# Patient Record
Sex: Female | Born: 1935 | Race: White | Hispanic: No | Smoking: Never smoker
Health system: Southern US, Community
[De-identification: ages and names within clinical notes are randomized; demographics above are authoritative.]

## PROBLEM LIST (undated history)

## (undated) DIAGNOSIS — I639 Cerebral infarction, unspecified: Secondary | ICD-10-CM

## (undated) DIAGNOSIS — G4730 Sleep apnea, unspecified: Secondary | ICD-10-CM

## (undated) DIAGNOSIS — I10 Essential (primary) hypertension: Secondary | ICD-10-CM

## (undated) DIAGNOSIS — E079 Disorder of thyroid, unspecified: Secondary | ICD-10-CM

## (undated) DIAGNOSIS — T148XXA Other injury of unspecified body region, initial encounter: Secondary | ICD-10-CM

## (undated) HISTORY — PX: OTHER SURGICAL HISTORY: SHX169

## (undated) HISTORY — PX: TOTAL SHOULDER REPLACEMENT: SUR1217

## (undated) HISTORY — PX: TOTAL ABDOMINAL HYSTERECTOMY: SHX209

## (undated) HISTORY — PX: WRIST SURGERY: SHX841

## (undated) HISTORY — DX: Other injury of unspecified body region, initial encounter: T14.8XXA

## (undated) HISTORY — DX: Essential (primary) hypertension: I10

## (undated) HISTORY — DX: Cerebral infarction, unspecified: I63.9

## (undated) HISTORY — DX: Sleep apnea, unspecified: G47.30

## (undated) HISTORY — DX: Disorder of thyroid, unspecified: E07.9

---

## 2000-04-22 ENCOUNTER — Ambulatory Visit (HOSPITAL_COMMUNITY): Admission: RE | Admit: 2000-04-22 | Discharge: 2000-04-22 | Payer: Self-pay | Admitting: Specialist

## 2000-04-22 ENCOUNTER — Encounter: Payer: Self-pay | Admitting: Specialist

## 2000-05-28 ENCOUNTER — Encounter: Payer: Self-pay | Admitting: Specialist

## 2000-05-28 ENCOUNTER — Ambulatory Visit (HOSPITAL_COMMUNITY): Admission: RE | Admit: 2000-05-28 | Discharge: 2000-05-28 | Payer: Self-pay | Admitting: Specialist

## 2000-06-11 ENCOUNTER — Encounter: Payer: Self-pay | Admitting: Specialist

## 2000-06-11 ENCOUNTER — Ambulatory Visit (HOSPITAL_COMMUNITY): Admission: RE | Admit: 2000-06-11 | Discharge: 2000-06-11 | Payer: Self-pay | Admitting: Specialist

## 2005-06-26 ENCOUNTER — Ambulatory Visit: Payer: Self-pay | Admitting: Family Medicine

## 2005-07-02 ENCOUNTER — Ambulatory Visit: Payer: Self-pay | Admitting: Internal Medicine

## 2005-11-20 ENCOUNTER — Other Ambulatory Visit: Payer: Self-pay

## 2005-11-20 ENCOUNTER — Emergency Department: Payer: Self-pay | Admitting: Emergency Medicine

## 2005-11-21 ENCOUNTER — Ambulatory Visit: Payer: Self-pay | Admitting: Emergency Medicine

## 2006-01-24 ENCOUNTER — Ambulatory Visit: Payer: Self-pay | Admitting: Ophthalmology

## 2006-03-18 ENCOUNTER — Ambulatory Visit: Payer: Self-pay | Admitting: General Surgery

## 2006-05-25 ENCOUNTER — Emergency Department: Payer: Self-pay | Admitting: Unknown Physician Specialty

## 2006-05-25 ENCOUNTER — Other Ambulatory Visit: Payer: Self-pay

## 2006-08-28 ENCOUNTER — Ambulatory Visit: Payer: Self-pay | Admitting: Internal Medicine

## 2006-10-01 ENCOUNTER — Emergency Department: Payer: Self-pay | Admitting: Emergency Medicine

## 2006-10-29 ENCOUNTER — Ambulatory Visit: Payer: Self-pay | Admitting: Internal Medicine

## 2007-03-13 ENCOUNTER — Encounter: Admission: RE | Admit: 2007-03-13 | Discharge: 2007-03-13 | Payer: Self-pay | Admitting: Orthopaedic Surgery

## 2007-03-17 ENCOUNTER — Ambulatory Visit (HOSPITAL_BASED_OUTPATIENT_CLINIC_OR_DEPARTMENT_OTHER): Admission: RE | Admit: 2007-03-17 | Discharge: 2007-03-18 | Payer: Self-pay | Admitting: Orthopaedic Surgery

## 2007-11-05 ENCOUNTER — Ambulatory Visit: Payer: Self-pay | Admitting: Internal Medicine

## 2007-11-17 ENCOUNTER — Ambulatory Visit: Payer: Self-pay | Admitting: Internal Medicine

## 2008-11-07 ENCOUNTER — Ambulatory Visit: Payer: Self-pay | Admitting: Internal Medicine

## 2009-03-08 ENCOUNTER — Ambulatory Visit: Payer: Self-pay | Admitting: Internal Medicine

## 2009-05-08 ENCOUNTER — Ambulatory Visit: Payer: Self-pay | Admitting: Internal Medicine

## 2009-05-16 ENCOUNTER — Ambulatory Visit: Payer: Self-pay | Admitting: Internal Medicine

## 2009-06-15 ENCOUNTER — Ambulatory Visit: Payer: Self-pay | Admitting: Internal Medicine

## 2009-06-22 ENCOUNTER — Ambulatory Visit: Payer: Self-pay | Admitting: Internal Medicine

## 2009-08-08 ENCOUNTER — Ambulatory Visit: Payer: Self-pay | Admitting: Internal Medicine

## 2009-08-24 ENCOUNTER — Ambulatory Visit: Payer: Self-pay | Admitting: Internal Medicine

## 2010-09-29 ENCOUNTER — Emergency Department: Payer: Self-pay | Admitting: Unknown Physician Specialty

## 2010-10-02 ENCOUNTER — Emergency Department: Payer: Self-pay | Admitting: Emergency Medicine

## 2010-10-08 ENCOUNTER — Ambulatory Visit: Payer: Self-pay | Admitting: Internal Medicine

## 2010-10-11 ENCOUNTER — Ambulatory Visit: Payer: Self-pay | Admitting: Internal Medicine

## 2010-12-03 ENCOUNTER — Encounter: Payer: Self-pay | Admitting: Internal Medicine

## 2010-12-16 ENCOUNTER — Encounter: Payer: Self-pay | Admitting: Internal Medicine

## 2011-01-16 ENCOUNTER — Encounter: Payer: Self-pay | Admitting: Internal Medicine

## 2011-01-23 ENCOUNTER — Emergency Department: Payer: Self-pay | Admitting: Emergency Medicine

## 2011-01-29 ENCOUNTER — Encounter: Payer: Self-pay | Admitting: Internal Medicine

## 2011-01-29 ENCOUNTER — Emergency Department: Payer: Self-pay | Admitting: Emergency Medicine

## 2011-02-06 ENCOUNTER — Ambulatory Visit: Payer: Self-pay | Admitting: Unknown Physician Specialty

## 2011-02-07 ENCOUNTER — Ambulatory Visit: Payer: Self-pay | Admitting: Unknown Physician Specialty

## 2011-02-10 ENCOUNTER — Emergency Department: Payer: Self-pay | Admitting: Emergency Medicine

## 2011-02-14 ENCOUNTER — Encounter: Payer: Self-pay | Admitting: Internal Medicine

## 2011-03-17 ENCOUNTER — Encounter: Payer: Self-pay | Admitting: Internal Medicine

## 2011-03-21 ENCOUNTER — Inpatient Hospital Stay: Payer: Self-pay | Admitting: Internal Medicine

## 2011-03-31 ENCOUNTER — Inpatient Hospital Stay: Payer: Self-pay | Admitting: Internal Medicine

## 2011-04-16 ENCOUNTER — Encounter: Payer: Self-pay | Admitting: Internal Medicine

## 2011-04-24 ENCOUNTER — Inpatient Hospital Stay: Payer: Self-pay | Admitting: Internal Medicine

## 2011-04-24 DIAGNOSIS — I6789 Other cerebrovascular disease: Secondary | ICD-10-CM

## 2011-05-06 ENCOUNTER — Emergency Department: Payer: Self-pay | Admitting: Emergency Medicine

## 2011-05-10 ENCOUNTER — Inpatient Hospital Stay: Payer: Self-pay | Admitting: Internal Medicine

## 2011-07-24 ENCOUNTER — Encounter: Payer: Self-pay | Admitting: Internal Medicine

## 2011-08-05 ENCOUNTER — Other Ambulatory Visit: Payer: Self-pay | Admitting: Internal Medicine

## 2011-08-05 MED ORDER — GLIPIZIDE 5 MG PO TABS: 5.0000 mg | ORAL_TABLET | Freq: Every day | ORAL | Status: DC

## 2011-08-06 ENCOUNTER — Other Ambulatory Visit: Payer: Self-pay | Admitting: *Deleted

## 2011-08-06 ENCOUNTER — Telehealth: Payer: Self-pay | Admitting: Internal Medicine

## 2011-08-06 MED ORDER — OMEPRAZOLE 20 MG PO CPDR: 20.0000 mg | DELAYED_RELEASE_CAPSULE | Freq: Two times a day (BID) | ORAL | Status: DC

## 2011-08-06 MED ORDER — LEVOTHYROXINE SODIUM 75 MCG PO TABS: 75.0000 ug | ORAL_TABLET | Freq: Every day | ORAL | Status: DC

## 2011-08-06 NOTE — Telephone Encounter (Signed)
Please call patient, she needs refills for her reflux & thyroid meds

## 2011-08-29 ENCOUNTER — Observation Stay: Payer: Self-pay | Admitting: Internal Medicine

## 2011-08-31 ENCOUNTER — Encounter: Payer: Self-pay | Admitting: Internal Medicine

## 2011-09-02 ENCOUNTER — Telehealth: Payer: Self-pay | Admitting: Internal Medicine

## 2011-09-02 NOTE — Telephone Encounter (Signed)
Called in the Macrobid to [facility] [address] and informed her to f/up next week after she takes the medication

## 2011-09-02 NOTE — Telephone Encounter (Signed)
Message copied by Virgina Evener on Mon Sep 02, 2011  5:25 PM ------      Message from: Ronna Polio A      Created: Mon Sep 02, 2011 11:59 AM      Regarding: UTI       I was called by hospitalist that pt had UTI. Pt needs to STOP levaquin and start Macrobid 100mg  by mouth twice daily x7 days. She also needs follow up here this week or next.

## 2011-09-10 ENCOUNTER — Other Ambulatory Visit: Payer: Self-pay | Admitting: Internal Medicine

## 2011-09-11 ENCOUNTER — Encounter: Payer: Self-pay | Admitting: Internal Medicine

## 2011-09-11 ENCOUNTER — Ambulatory Visit (INDEPENDENT_AMBULATORY_CARE_PROVIDER_SITE_OTHER): Payer: Medicare Other | Admitting: Internal Medicine

## 2011-09-11 DIAGNOSIS — E039 Hypothyroidism, unspecified: Secondary | ICD-10-CM

## 2011-09-11 DIAGNOSIS — F419 Anxiety disorder, unspecified: Secondary | ICD-10-CM

## 2011-09-11 DIAGNOSIS — Z8673 Personal history of transient ischemic attack (TIA), and cerebral infarction without residual deficits: Secondary | ICD-10-CM | POA: Insufficient documentation

## 2011-09-11 DIAGNOSIS — I10 Essential (primary) hypertension: Secondary | ICD-10-CM

## 2011-09-11 DIAGNOSIS — E785 Hyperlipidemia, unspecified: Secondary | ICD-10-CM

## 2011-09-11 DIAGNOSIS — E118 Type 2 diabetes mellitus with unspecified complications: Secondary | ICD-10-CM | POA: Insufficient documentation

## 2011-09-11 DIAGNOSIS — IMO0002 Reserved for concepts with insufficient information to code with codable children: Secondary | ICD-10-CM

## 2011-09-11 DIAGNOSIS — W19XXXA Unspecified fall, initial encounter: Secondary | ICD-10-CM

## 2011-09-11 DIAGNOSIS — S0993XA Unspecified injury of face, initial encounter: Secondary | ICD-10-CM

## 2011-09-11 DIAGNOSIS — Z23 Encounter for immunization: Secondary | ICD-10-CM

## 2011-09-11 DIAGNOSIS — I6350 Cerebral infarction due to unspecified occlusion or stenosis of unspecified cerebral artery: Secondary | ICD-10-CM

## 2011-09-11 DIAGNOSIS — F411 Generalized anxiety disorder: Secondary | ICD-10-CM

## 2011-09-11 DIAGNOSIS — E119 Type 2 diabetes mellitus without complications: Secondary | ICD-10-CM

## 2011-09-11 DIAGNOSIS — I639 Cerebral infarction, unspecified: Secondary | ICD-10-CM

## 2011-09-11 LAB — PROTIME-INR
INR: 3.19 — ABNORMAL HIGH (ref <1.50)
Prothrombin Time: 33.7 seconds — ABNORMAL HIGH (ref 11.6–15.2)

## 2011-09-11 NOTE — Patient Instructions (Signed)
Labs today.    Follow up in 1 month

## 2011-09-11 NOTE — Progress Notes (Signed)
Subjective:    Patient ID: Alexandra English, female    DOB: 1927/07/24, 77 y.o.   MRN: [id number]  HPI Alexandra English is an 77 year old female with a history of hypertension, hyperlipidemia, diabetes mellitus, and recent history of several strokes. She had been doing well over the last 6 months until 2 weeks ago when she tripped on a rug at home and fell hitting the right side of her face on the ground. She had extensive bruising over her face as a result of this. She was evaluated in the emergency room and had a CT of her head which was negative for acute hemorrhage. During her evaluation in the emergency room she was noted to have a urinary tract infection. She continues on antibiotics for this, and is almost complete with intercourse. She denies any fever, chills, dysuria, weakness, change in her gait. Her son has made extensive efforts in her home to prevent falls. She denies any complaints today, with the exception of mild discomfort over the right side of her face.  Outpatient Encounter Prescriptions as of 09/11/2011  Medication Sig Dispense Refill  . aspirin 81 MG tablet Take 81 mg by mouth daily.        Marland Kitchen atorvastatin (LIPITOR) 40 MG tablet TAKE ONE TABLET BY MOUTH EVERY DAY  30 tablet  11  . glipiZIDE (GLUCOTROL) 5 MG tablet Take 1 tablet (5 mg total) by mouth daily.  180 tablet  3  . levothyroxine (SYNTHROID) 75 MCG tablet Take 1 tablet (75 mcg total) by mouth daily.  30 tablet  3  . omeprazole (PRILOSEC) 20 MG capsule Take 1 capsule (20 mg total) by mouth 2 (two) times daily.  60 capsule  3  . warfarin (COUMADIN) 3 MG tablet Take 3 mg by mouth daily.          Review of Systems  Constitutional: Negative for fever, chills, appetite change, fatigue and unexpected weight change.  HENT: Negative for ear pain, congestion, sore throat, trouble swallowing, neck pain, voice change and sinus pressure.   Eyes: Negative for visual disturbance.  Respiratory: Negative for cough, shortness of breath,  wheezing and stridor.   Cardiovascular: Negative for chest pain, palpitations and leg swelling.  Gastrointestinal: Negative for nausea, vomiting, abdominal pain, diarrhea, constipation, blood in stool, abdominal distention and anal bleeding.  Genitourinary: Negative for dysuria and flank pain.  Musculoskeletal: Negative for myalgias, arthralgias and gait problem.  Skin: Negative for color change and rash.  Neurological: Negative for dizziness and headaches.  Hematological: Negative for adenopathy. Does not bruise/bleed easily.  Psychiatric/Behavioral: Negative for suicidal ideas, sleep disturbance and dysphoric mood. The patient is not nervous/anxious.    BP 179/82  Pulse 66  Temp(Src) 98 F (36.7 C) (Oral)  Resp 16  Ht 5\' 5"  (1.651 m)  Wt 184 lb (83.462 kg)  BMI 30.62 kg/m2  SpO2 99%     Objective:   Physical Exam  Constitutional: She is oriented to person, place, and time. She appears well-developed and well-nourished. No distress.  HENT:  Head: Normocephalic and atraumatic.  Right Ear: External ear normal.  Left Ear: External ear normal.  Nose: Nose normal.  Mouth/Throat: Oropharynx is clear and moist. No oropharyngeal exudate.  Eyes: Conjunctivae are normal. Pupils are equal, round, and reactive to light. Right eye exhibits no discharge. Left eye exhibits no discharge. No scleral icterus.  Neck: Normal range of motion. Neck supple. No tracheal deviation present. No thyromegaly present.  Cardiovascular: Normal rate, regular rhythm, normal heart sounds  and intact distal pulses.  Exam reveals no gallop and no friction rub.   No murmur heard. Pulmonary/Chest: Effort normal and breath sounds normal. No respiratory distress. She has no wheezes. She has no rales. She exhibits no tenderness.  Musculoskeletal: Normal range of motion. She exhibits no edema and no tenderness.  Lymphadenopathy:    She has no cervical adenopathy.  Neurological: She is alert and oriented to person, place,  and time. No cranial nerve deficit. She exhibits normal muscle tone. Coordination normal.  Skin: Skin is warm and dry. No rash noted. She is not diaphoretic. There is erythema. No pallor.     Psychiatric: She has a normal mood and affect. Her behavior is normal. Judgment and thought content normal.          Assessment & Plan:  1. Fall - patient with a recent fall resulting in facial contusion. She is doing well. Her son has made extensive efforts in her home to prevent further falls. She currently walks with a Alexandra English. Overall she is doing very well considering she had several strokes approximately 6 months ago. She understands the risk of continuing on Coumadin, and she prefers to take this risk in an effort to avoid recurrent stroke. We will check her INR with labs today.  2. DM - diabetes has been historically well controlled. Will check hemoglobin A1c with labs today.  3. HTN - blood pressure is slightly elevated today. Her blood pressure has historically been difficult to control. When checked at home by home health, her blood pressure was improved compared to in clinic. We'll continue to monitor for now. We will plan to check renal function and electrolytes and labs today.  4. HL - patient would prefer to stop statin medication. I encouraged her to continue with atorvastatin given her recent history of strokes. We will check liver function tests with labs today. She will followup in one month.

## 2011-09-12 ENCOUNTER — Other Ambulatory Visit: Payer: Self-pay | Admitting: Internal Medicine

## 2011-09-12 LAB — CBC WITH DIFFERENTIAL/PLATELET
Basophils Relative: 0.3 % (ref 0.0–3.0)
Eosinophils Absolute: 0.2 10*3/uL (ref 0.0–0.7)
HCT: 44.5 % (ref 36.0–46.0)
Lymphs Abs: 2.7 10*3/uL (ref 0.7–4.0)
MCHC: 32.5 g/dL (ref 30.0–36.0)
MCV: 93.7 fl (ref 78.0–100.0)
Monocytes Absolute: 0.4 10*3/uL (ref 0.1–1.0)
Neutrophils Relative %: 68.8 % (ref 43.0–77.0)
Platelets: 235 10*3/uL (ref 150.0–400.0)

## 2011-09-13 ENCOUNTER — Encounter: Payer: Self-pay | Admitting: Internal Medicine

## 2011-09-13 LAB — COMPREHENSIVE METABOLIC PANEL
Alkaline Phosphatase: 98 U/L (ref 39–117)
BUN: 15 mg/dL (ref 6–23)
Glucose, Bld: 78 mg/dL (ref 70–99)
Sodium: 142 mEq/L (ref 135–145)
Total Bilirubin: 0.3 mg/dL (ref 0.3–1.2)
Total Protein: 8.5 g/dL — ABNORMAL HIGH (ref 6.0–8.3)

## 2011-09-13 LAB — TSH: TSH: 1.41 u[IU]/mL (ref 0.35–5.50)

## 2011-09-13 LAB — HEMOGLOBIN A1C: Hgb A1c MFr Bld: 6.9 % — ABNORMAL HIGH (ref 4.6–6.5)

## 2011-09-27 ENCOUNTER — Telehealth: Payer: Self-pay | Admitting: Internal Medicine

## 2011-09-27 NOTE — Telephone Encounter (Signed)
Her INR is overdue. It was due earlier this week. On last check it was slightly high.  We can either do it here or have home health do it.

## 2011-09-27 NOTE — Telephone Encounter (Signed)
[name] @ [facility] CALLED SHE WANTED TO KNOW WHEN Alexandra English'S NEXT PT/INR IS DUE. PT REFUSES TO LET THEM DO IT THERE AND SHE DOES NOT WANT TO COME BACK IN THE OFFICE HER PHONE NUMBER IS [phone]

## 2011-10-01 NOTE — Telephone Encounter (Signed)
I talked with Ms. Heying and she agreed after 45 minutes on the phone to let sharon come out in the morning and draw her PT/INR.

## 2011-10-02 LAB — PROTIME-INR

## 2011-10-03 ENCOUNTER — Telehealth: Payer: Self-pay | Admitting: *Deleted

## 2011-10-03 NOTE — Telephone Encounter (Signed)
Patient is currently taking 3 mg daily.   I spoke with Dr. Dan Humphreys and per Dr. Dan Humphreys she is to take 3 on  Mon, wed, and Frid and 2 mg on all the other days. Recheck PT / INR in one week.

## 2011-10-03 NOTE — Telephone Encounter (Signed)
Message copied by Jobie Quaker on Thu Oct 03, 2011  4:06 PM ------      Message from: Ronna Polio A      Created: Thu Oct 03, 2011  7:55 AM      Regarding: INR       Alexandra English's INR from [facility] was slightly high at 3.6. I know that she has been reluctant to have INR checked. We will need to reduce coumadin dose (first need to confirm what she is taking) and then have her recheck INR in 1 week.      If she is not willing to do this, then we need to stop coumadin.

## 2011-10-07 ENCOUNTER — Encounter: Payer: Self-pay | Admitting: Internal Medicine

## 2011-10-07 ENCOUNTER — Ambulatory Visit (INDEPENDENT_AMBULATORY_CARE_PROVIDER_SITE_OTHER): Payer: Medicare Other | Admitting: Internal Medicine

## 2011-10-07 DIAGNOSIS — I10 Essential (primary) hypertension: Secondary | ICD-10-CM

## 2011-10-07 DIAGNOSIS — I6350 Cerebral infarction due to unspecified occlusion or stenosis of unspecified cerebral artery: Secondary | ICD-10-CM

## 2011-10-07 DIAGNOSIS — R300 Dysuria: Secondary | ICD-10-CM

## 2011-10-07 DIAGNOSIS — I639 Cerebral infarction, unspecified: Secondary | ICD-10-CM

## 2011-10-07 DIAGNOSIS — E119 Type 2 diabetes mellitus without complications: Secondary | ICD-10-CM

## 2011-10-07 LAB — POCT URINALYSIS DIPSTICK
Blood, UA: NEGATIVE
Glucose, UA: NEGATIVE
Ketones, UA: NEGATIVE
Spec Grav, UA: 1.005
Urobilinogen, UA: 0.2

## 2011-10-07 NOTE — Progress Notes (Signed)
Subjective:    Patient ID: Alexandra English, female    DOB: [birth year], 77 y.o.   MRN: [id number]  HPI Alexandra English is a an 77 year old female with a history of hypertension, diabetes, and recently several strokes who presents for followup. She has been on anticoagulation with coumadin at the recommendation of Dr. Chestine Spore from neurology after a series of several strokes. She had significant improvement in her ability to function after this intervention. She continues to have mild expressive aphasia and gait instability. At her last visit one month ago she had recently suffered a fall leading to a contusion of her right face. She has had no falls since her last visit. She reports that her bruising is healing well. She has no complaints today. She continues on coumadin and recent INR was 3.6.  Coumadin was held and restarted at lower dose.  She reports full compliance with this change.  Also at her last visit, she was recovering from a recent urinary tract infection. She reports she completed her antibiotics. She is interested in repeating her urinalysis today to ensure no residual infection. She denies any fever, flank pain, hematuria. She does have occasional urinary frequency.  Outpatient Encounter Prescriptions as of 10/07/2011  Medication Sig Dispense Refill  . amLODipine (NORVASC) 5 MG tablet Take 5 mg by mouth daily.        Marland Kitchen aspirin 81 MG tablet Take 81 mg by mouth daily.        Marland Kitchen BAYER CONTOUR TEST test strip       . cloNIDine (CATAPRES) 0.2 MG tablet Take 0.2 mg by mouth daily.        Marland Kitchen glipiZIDE (GLUCOTROL) 5 MG tablet Take 1 tablet (5 mg total) by mouth daily.  180 tablet  3  . hydrOXYzine (ATARAX/VISTARIL) 25 MG tablet Take 25 mg by mouth daily as needed.        . latanoprost (XALATAN) 0.005 % ophthalmic solution       . levothyroxine (SYNTHROID) 75 MCG tablet Take 1 tablet (75 mcg total) by mouth daily.  30 tablet  3  . omeprazole (PRILOSEC) 20 MG capsule Take 1 capsule (20 mg total) by mouth  2 (two) times daily.  60 capsule  3  . warfarin (COUMADIN) 3 MG tablet Take 3 mg by mouth daily. Take 3mg  Mon,Wed, and Friday and take 2mg  Tues,thurs, Sat. And Sunday      . atorvastatin (LIPITOR) 40 MG tablet TAKE ONE TABLET BY MOUTH EVERY DAY  30 tablet  11    Review of Systems  Constitutional: Negative for fever, chills, appetite change, fatigue and unexpected weight change.  HENT: Negative for ear pain, congestion, sore throat, trouble swallowing, neck pain, voice change and sinus pressure.   Eyes: Negative for visual disturbance.  Respiratory: Negative for cough, shortness of breath, wheezing and stridor.   Cardiovascular: Negative for chest pain, palpitations and leg swelling.  Gastrointestinal: Negative for nausea, vomiting, abdominal pain, diarrhea, constipation, blood in stool, abdominal distention and anal bleeding.  Genitourinary: Positive for frequency. Negative for dysuria, urgency, hematuria and flank pain.  Musculoskeletal: Positive for myalgias and gait problem. Negative for arthralgias.  Skin: Positive for color change. Negative for rash.  Neurological: Negative for dizziness and headaches.  Hematological: Negative for adenopathy. Does not bruise/bleed easily.  Psychiatric/Behavioral: Negative for suicidal ideas, sleep disturbance and dysphoric mood. The patient is not nervous/anxious.    BP 146/86  Pulse 91  Temp(Src) 97.5 F (36.4 C) (Oral)  Resp 16  Ht 5\' 5"  (1.651 m)  Wt 187 lb 12 oz (85.163 kg)  BMI 31.24 kg/m2  SpO2 100%     Objective:   Physical Exam  Constitutional: She is oriented to person, place, and time. She appears well-developed and well-nourished. No distress.  HENT:  Head: Normocephalic and atraumatic.  Right Ear: External ear normal.  Left Ear: External ear normal.  Nose: Nose normal.  Mouth/Throat: Oropharynx is clear and moist. No oropharyngeal exudate.  Eyes: Conjunctivae are normal. Pupils are equal, round, and reactive to light. Right eye  exhibits no discharge. Left eye exhibits no discharge. No scleral icterus.  Neck: Normal range of motion. Neck supple. No tracheal deviation present. No thyromegaly present.  Cardiovascular: Normal rate, regular rhythm, normal heart sounds and intact distal pulses.  Exam reveals no gallop and no friction rub.   No murmur heard. Pulmonary/Chest: Effort normal and breath sounds normal. No respiratory distress. She has no wheezes. She has no rales. She exhibits no tenderness.  Musculoskeletal: Normal range of motion. She exhibits no edema and no tenderness.  Lymphadenopathy:    She has no cervical adenopathy.  Neurological: She is alert and oriented to person, place, and time. No cranial nerve deficit. She exhibits normal muscle tone. Coordination normal.  Skin: Skin is warm and dry. No rash noted. She is not diaphoretic. No erythema. No pallor.     Psychiatric: She has a normal mood and affect. Her behavior is normal. Judgment and thought content normal.          Assessment & Plan:  1. Anticoagulation management - recent INR was supratherapeutic. Patient's Coumadin was held for 2 days and then restart at a lower dose. She will repeat INR next Friday. We again reviewed risk of bleeding while on Coumadin. She has taken measures in her home to prevent further falls. She will followup in 3 months.  2. Hypertension - blood pressure is slightly elevated, but improved today. We'll plan to continue current medications. She is also being monitored by home health with regular blood pressure checks. She will followup in 3 months.  3. Urinary tract infection -urinalysis is normal today. Will send urine for culture to confirm that treatment cured infection.

## 2011-10-09 LAB — URINE CULTURE: Colony Count: NO GROWTH

## 2011-10-15 ENCOUNTER — Telehealth: Payer: Self-pay | Admitting: Internal Medicine

## 2011-10-15 NOTE — Telephone Encounter (Signed)
She needs INR this week.

## 2011-10-15 NOTE — Telephone Encounter (Signed)
Call Alexandra English with the next time patient is to have PT/INR.

## 2011-10-17 LAB — PROTIME-INR

## 2011-10-18 ENCOUNTER — Telehealth: Payer: Self-pay | Admitting: Internal Medicine

## 2011-10-18 NOTE — Telephone Encounter (Signed)
Informed Jasmine December and Ms. Blasko that her INR was 1.8 just below goal 2-3 and not to change her regimen and we will repeat in one month.  Jasmine December from [facility] is discharging her next week because she has no physcial need.  Please advise.

## 2011-10-18 NOTE — Telephone Encounter (Signed)
Done

## 2011-10-18 NOTE — Telephone Encounter (Signed)
INR was 1.8.  This is just below goal of 2-3, however I would prefer not to make changes to regimen. Will repeat INR 1 month.

## 2011-10-18 NOTE — Telephone Encounter (Signed)
OK. Should come here for INR in 48month.

## 2011-10-21 NOTE — Telephone Encounter (Signed)
Left message at home number listed in chart for someone to give me a call about setting up the pt/inr

## 2011-10-24 ENCOUNTER — Encounter: Payer: Self-pay | Admitting: Internal Medicine

## 2011-10-30 NOTE — Telephone Encounter (Signed)
Spoke with patient and she says that she can't come to out office for her pt/inr because she does not have a way to get here. She has been discharged from home health and is asking if there is another way for her to have her pt/inr done from home. I have left message for son to call me back x 2, but he has not returned my call.

## 2011-10-30 NOTE — Telephone Encounter (Signed)
If pt cannot have PT/INR monitored on a regular basis, we will need to stop coumadin.

## 2011-10-31 NOTE — Telephone Encounter (Signed)
Left message asking son to call me.

## 2011-11-05 NOTE — Telephone Encounter (Signed)
Left message asking that someone return my call.

## 2011-11-06 ENCOUNTER — Encounter: Payer: Self-pay | Admitting: Internal Medicine

## 2011-11-08 ENCOUNTER — Telehealth: Payer: Self-pay | Admitting: Internal Medicine

## 2011-11-08 NOTE — Telephone Encounter (Signed)
Son called and had a question about his mother Alexandra English and testing, he states she got tested less than a month ago.  He states someone from our office called and stated she didn't need to take this medication anymore.  He said it wasn't time for her to have another Pt/inr testing until 2 weeks or so.  Please advise.

## 2011-11-08 NOTE — Telephone Encounter (Signed)
I spoke w/son, he has a lot of concerns regarding change in patient's behavior. Per son, she has always been a negative person and "hard to get along with" but it has been worse over the last month or so. I scheduled an apt for patient to see Dr Dan Humphreys on 12/6. Son would like to speak with you privately for a couple minutes before she is seen for OV. I advised him that we could try to work this out if time permits and I blocked 30 minutes for this f/u apt. Son also requested that we call him first for anything. I changed the home # to his number.

## 2011-11-08 NOTE — Telephone Encounter (Signed)
Patient and family notified. She will stop the coumadin.

## 2011-11-13 ENCOUNTER — Encounter: Payer: Self-pay | Admitting: Internal Medicine

## 2011-11-19 ENCOUNTER — Other Ambulatory Visit: Payer: Self-pay | Admitting: Internal Medicine

## 2011-11-21 ENCOUNTER — Other Ambulatory Visit: Payer: Medicare Other

## 2011-11-21 ENCOUNTER — Encounter: Payer: Self-pay | Admitting: Internal Medicine

## 2011-11-21 ENCOUNTER — Ambulatory Visit (INDEPENDENT_AMBULATORY_CARE_PROVIDER_SITE_OTHER): Payer: Medicare Other | Admitting: Internal Medicine

## 2011-11-21 VITALS — BP 128/76 | HR 76 | Temp 98.3°F | Resp 14 | Wt 191.8 lb

## 2011-11-21 DIAGNOSIS — F32A Depression, unspecified: Secondary | ICD-10-CM

## 2011-11-21 DIAGNOSIS — D649 Anemia, unspecified: Secondary | ICD-10-CM

## 2011-11-21 DIAGNOSIS — M25579 Pain in unspecified ankle and joints of unspecified foot: Secondary | ICD-10-CM

## 2011-11-21 DIAGNOSIS — E119 Type 2 diabetes mellitus without complications: Secondary | ICD-10-CM

## 2011-11-21 DIAGNOSIS — Z7901 Long term (current) use of anticoagulants: Secondary | ICD-10-CM

## 2011-11-21 DIAGNOSIS — F329 Major depressive disorder, single episode, unspecified: Secondary | ICD-10-CM | POA: Insufficient documentation

## 2011-11-21 DIAGNOSIS — M25572 Pain in left ankle and joints of left foot: Secondary | ICD-10-CM

## 2011-11-21 DIAGNOSIS — F3289 Other specified depressive episodes: Secondary | ICD-10-CM

## 2011-11-21 LAB — PROTIME-INR
INR: 1.32 (ref <1.50)
Prothrombin Time: 16.9 seconds — ABNORMAL HIGH (ref 11.6–15.2)

## 2011-11-21 MED ORDER — GLIPIZIDE 5 MG PO TABS: 5.0000 mg | ORAL_TABLET | Freq: Every day | ORAL | Status: DC
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
## 2011-11-21 NOTE — Progress Notes (Signed)
Addended by: Jobie Quaker on: 11/21/2011 05:16 PM   Modules accepted: Orders

## 2011-11-21 NOTE — Progress Notes (Signed)
Addended by: Jobie Quaker on: 11/21/2011 05:12 PM   Modules accepted: Orders

## 2011-11-22 ENCOUNTER — Telehealth: Payer: Self-pay | Admitting: *Deleted

## 2011-11-22 LAB — COMPREHENSIVE METABOLIC PANEL
AST: 25 U/L (ref 0–37)
BUN: 17 mg/dL (ref 6–23)
Calcium: 9.1 mg/dL (ref 8.4–10.5)
Chloride: 106 mEq/L (ref 96–112)
Creatinine, Ser: 0.7 mg/dL (ref 0.4–1.2)
Total Bilirubin: 0.8 mg/dL (ref 0.3–1.2)

## 2011-11-22 LAB — CBC WITH DIFFERENTIAL/PLATELET
Basophils Relative: 0.5 % (ref 0.0–3.0)
Eosinophils Absolute: 0.2 10*3/uL (ref 0.0–0.7)
HCT: 38.7 % (ref 36.0–46.0)
Hemoglobin: 13 g/dL (ref 12.0–15.0)
Lymphocytes Relative: 24.4 % (ref 12.0–46.0)
Lymphs Abs: 2.1 10*3/uL (ref 0.7–4.0)
MCHC: 33.7 g/dL (ref 30.0–36.0)
MCV: 93.3 fl (ref 78.0–100.0)
Monocytes Absolute: 0.5 10*3/uL (ref 0.1–1.0)
Neutro Abs: 5.7 10*3/uL (ref 1.4–7.7)
RBC: 4.15 Mil/uL (ref 3.87–5.11)
RDW: 13.9 % (ref 11.5–14.6)

## 2011-11-22 NOTE — Telephone Encounter (Signed)
Message copied by Vernie Murders on Fri Nov 22, 2011  4:01 PM ------      Message from: Ronna Polio A      Created: Thu Nov 21, 2011  9:38 PM       INR was very low at 1.32. Has pt been taking her coumadin? This would be a big drop in INR.  If yes, then please confirm dose she has been taking.

## 2011-11-27 ENCOUNTER — Other Ambulatory Visit: Payer: Medicare Other

## 2011-11-27 ENCOUNTER — Other Ambulatory Visit: Payer: Self-pay | Admitting: Internal Medicine

## 2011-11-27 ENCOUNTER — Telehealth: Payer: Self-pay | Admitting: *Deleted

## 2011-11-27 DIAGNOSIS — Z7901 Long term (current) use of anticoagulants: Secondary | ICD-10-CM

## 2011-11-27 NOTE — Telephone Encounter (Signed)
Can you call her son and let him know about this? Home health is not going to come out once per month for labs for another need.  I would recommend stopping Coumadin because risk is too great without monitoring and the neurologist was planning to stop after 6 months.

## 2011-11-27 NOTE — Telephone Encounter (Signed)
Patient came in today for pt/inr. It was unsuccessful stick x3, and the entire time the patient kept saying that she hates having her blood drawn because no one can ever get it, she hates leaving her house to have this done because she is so tired when she is done. She wants some one to come out to her house to have this done or she just wants to stop the coumadin all together. She is want home health to be set up again. According to phone note from 10-18-11 we have been through this with her before.

## 2011-11-27 NOTE — Telephone Encounter (Signed)
Son notified. He agreed with Dr. Tilman Neat plan. Also called patient at son's request. She said that she will not take the coumadin any longer. She says that the only way that she would be able to keep taking it is if some one could come out to her house.

## 2011-12-01 ENCOUNTER — Other Ambulatory Visit: Payer: Self-pay | Admitting: Internal Medicine

## 2011-12-02 ENCOUNTER — Encounter: Payer: Self-pay | Admitting: Internal Medicine

## 2011-12-02 ENCOUNTER — Ambulatory Visit (INDEPENDENT_AMBULATORY_CARE_PROVIDER_SITE_OTHER): Payer: Medicare Other | Admitting: Internal Medicine

## 2011-12-02 VITALS — BP 138/60 | HR 83 | Temp 98.2°F | Wt 191.0 lb

## 2011-12-02 DIAGNOSIS — R269 Unspecified abnormalities of gait and mobility: Secondary | ICD-10-CM

## 2011-12-02 DIAGNOSIS — J069 Acute upper respiratory infection, unspecified: Secondary | ICD-10-CM

## 2011-12-02 DIAGNOSIS — R2681 Unsteadiness on feet: Secondary | ICD-10-CM

## 2011-12-02 MED ORDER — GUAIFENESIN-CODEINE 100-10 MG/5ML PO SYRP: 5.0000 mL | ORAL_SOLUTION | Freq: Two times a day (BID) | ORAL | Status: DC | PRN

## 2011-12-02 NOTE — Progress Notes (Signed)
Subjective:    Patient ID: Alexandra English, female    DOB: [birth year], 77 y.o.   MRN: [id number]  HPI 77YO female with h/o CVA presents for acute visit after fall at home last Thursday night. Notes her left foot was caught on something and she fell to ground which was carpeted. Did not hit head. No LOC. She was unable to get back up, and used her alert device to summon EMS.  EMS evaluated her and she opted not to go to the ED. She notes some pain and stiffness in her right back and arms/legs over last few days. Has been using Tylenol with minimal improvement.  Also notes several day h/o clear nasal drainage and non-productive cough. Denies fever, chills, dyspnea, chest pain. Denies sore throat, nausea, vomiting, abdominal pain. No known sick contacts. Has some muscle pain at site of fall, but no generalized myalgia. Has been using tylenol with minimal improvement.   Review of Systems  Constitutional: Negative for fever, chills, appetite change, fatigue and unexpected weight change.  HENT: Positive for congestion, rhinorrhea and postnasal drip. Negative for ear pain, sore throat, trouble swallowing, neck pain, voice change and sinus pressure.   Eyes: Negative for visual disturbance.  Respiratory: Positive for cough. Negative for shortness of breath, wheezing and stridor.   Cardiovascular: Negative for chest pain, palpitations and leg swelling.  Gastrointestinal: Negative for nausea, vomiting, abdominal pain and diarrhea.  Genitourinary: Negative for dysuria and flank pain.  Musculoskeletal: Positive for myalgias, arthralgias and gait problem.  Skin: Negative for color change and rash.  Neurological: Negative for dizziness and headaches.  Hematological: Negative for adenopathy. Does not bruise/bleed easily.  Psychiatric/Behavioral: Negative for suicidal ideas, sleep disturbance and dysphoric mood. The patient is not nervous/anxious.        Objective:   Physical Exam  Constitutional: She is  oriented to person, place, and time. She appears well-developed and well-nourished. No distress.  HENT:  Head: Normocephalic and atraumatic.  Right Ear: External ear normal.  Left Ear: External ear normal.  Nose: Nose normal.  Mouth/Throat: Oropharynx is clear and moist. No oropharyngeal exudate.  Eyes: Conjunctivae are normal. Pupils are equal, round, and reactive to light. Right eye exhibits no discharge. Left eye exhibits no discharge. No scleral icterus.  Neck: Normal range of motion. Neck supple. No tracheal deviation present. No thyromegaly present.  Cardiovascular: Normal rate, regular rhythm, normal heart sounds and intact distal pulses.  Exam reveals no gallop and no friction rub.   No murmur heard. Pulmonary/Chest: Effort normal and breath sounds normal. No respiratory distress. She has no wheezes. She has no rales. She exhibits no tenderness.  Musculoskeletal: Normal range of motion. She exhibits no edema and no tenderness.  Lymphadenopathy:    She has no cervical adenopathy.  Neurological: She is alert and oriented to person, place, and time. No cranial nerve deficit. She exhibits normal muscle tone. Coordination normal.  Skin: Skin is warm and dry. Ecchymosis noted. No rash noted. She is not diaphoretic. No erythema. No pallor.     Psychiatric: She has a normal mood and affect. Her behavior is normal. Judgment and thought content normal.          Assessment & Plan:  1. Viral URI - Symptoms most consistent with viral URI. Rapid flu test negative today. Will continue symptom management with tylenol prn and codeine prn for cough. If symptoms are persistent or worsening over next 48hr, then pt will call.    2. Gait instability with  fall - Some superficial bruising, but no evidence of other injury. Encouraged her to continue to use her alert device when at home alone and to use Stanton Kissoon.  She is now off coumadin. She recently completed physical therapy, and does not want to  continue with this. She will follow up 12/25/2010.

## 2011-12-03 ENCOUNTER — Telehealth: Payer: Self-pay | Admitting: *Deleted

## 2011-12-03 DIAGNOSIS — R52 Pain, unspecified: Secondary | ICD-10-CM

## 2011-12-03 MED ORDER — TRAMADOL HCL 50 MG PO TABS: 50.0000 mg | ORAL_TABLET | Freq: Three times a day (TID) | ORAL | Status: DC | PRN

## 2011-12-03 NOTE — Telephone Encounter (Signed)
Pt called to get rx for something for pain .  Pt fell regular tylenol is not helping.  She would like to get tylenol 3 or something stronger [facility] [address]

## 2011-12-03 NOTE — Telephone Encounter (Signed)
Patient informed. 

## 2011-12-03 NOTE — Telephone Encounter (Signed)
Heather called - Patient requesting RX for pain med, tylenol has given no relief.

## 2011-12-03 NOTE — Telephone Encounter (Signed)
No answer, no v/m

## 2011-12-03 NOTE — Telephone Encounter (Signed)
I called in Tramadol. I don't feel comfortable using a narcotic for her, given her h/o falls and gait problems. If pain is persistent on Tramadol, will need to be seen.

## 2011-12-04 ENCOUNTER — Observation Stay: Payer: Self-pay | Admitting: Internal Medicine

## 2011-12-13 ENCOUNTER — Ambulatory Visit: Payer: Medicare Other | Admitting: Internal Medicine

## 2011-12-20 ENCOUNTER — Telehealth: Payer: Self-pay | Admitting: Internal Medicine

## 2011-12-20 DIAGNOSIS — R52 Pain, unspecified: Secondary | ICD-10-CM

## 2011-12-20 NOTE — Telephone Encounter (Signed)
Tramadol is no longer on patient's med list, I spoke with patient and she is aware that DR. Dan Humphreys is out of the office today and will be returning on monday. She is okay with waiting until then for her refill to get approval from Dr. Dan Humphreys.

## 2011-12-20 NOTE — Telephone Encounter (Signed)
[phone]  Pt called to get refill on pain meds.  Tramadol    Pt is completely out  Took last pill yesterday [facility] [address]

## 2011-12-21 NOTE — Telephone Encounter (Signed)
Fine to refill tramadol 

## 2011-12-23 MED ORDER — TRAMADOL HCL 50 MG PO TABS: 50.0000 mg | ORAL_TABLET | Freq: Three times a day (TID) | ORAL | Status: AC | PRN

## 2011-12-23 NOTE — Telephone Encounter (Signed)
Done,  Patient informed

## 2011-12-26 ENCOUNTER — Ambulatory Visit: Payer: Medicare Other | Admitting: Internal Medicine

## 2011-12-27 ENCOUNTER — Telehealth: Payer: Self-pay | Admitting: *Deleted

## 2011-12-27 NOTE — Telephone Encounter (Signed)
PT from [facility] called - FYI for MD, Pt declined PT apt this week due to foot pain. She is scheduled for OV today w/Dr Regal for eval and therapist will check in w/pt next week.

## 2012-01-08 DIAGNOSIS — R262 Difficulty in walking, not elsewhere classified: Secondary | ICD-10-CM

## 2012-01-08 DIAGNOSIS — M545 Low back pain: Secondary | ICD-10-CM

## 2012-01-08 DIAGNOSIS — IMO0001 Reserved for inherently not codable concepts without codable children: Secondary | ICD-10-CM

## 2012-01-09 ENCOUNTER — Encounter: Payer: Self-pay | Admitting: Internal Medicine

## 2012-01-09 ENCOUNTER — Ambulatory Visit (INDEPENDENT_AMBULATORY_CARE_PROVIDER_SITE_OTHER): Payer: Medicare Other | Admitting: Internal Medicine

## 2012-01-09 VITALS — BP 132/60 | HR 67 | Temp 97.7°F | Ht 65.0 in | Wt 187.0 lb

## 2012-01-09 DIAGNOSIS — I639 Cerebral infarction, unspecified: Secondary | ICD-10-CM

## 2012-01-09 DIAGNOSIS — R2681 Unsteadiness on feet: Secondary | ICD-10-CM

## 2012-01-09 DIAGNOSIS — I6350 Cerebral infarction due to unspecified occlusion or stenosis of unspecified cerebral artery: Secondary | ICD-10-CM

## 2012-01-09 DIAGNOSIS — R269 Unspecified abnormalities of gait and mobility: Secondary | ICD-10-CM

## 2012-01-09 DIAGNOSIS — M2550 Pain in unspecified joint: Secondary | ICD-10-CM

## 2012-01-09 MED ORDER — TRAMADOL HCL 50 MG PO TABS: 50.0000 mg | ORAL_TABLET | Freq: Three times a day (TID) | ORAL | Status: AC | PRN

## 2012-01-09 NOTE — Progress Notes (Signed)
Subjective:    Patient ID: Alexandra English, female    DOB: [birth year], 78 y.o.   MRN: [id number]  HPI 78 year old female with history of repeated strokes and subsequent gait instability and weakness leading to multiple falls presents for followup. In the interim since her last visit, she decided to stop her Coumadin. She opted to stop this medication because she did not want to continue with laboratory monitoring. She reports another fall since her last visit. She fell backward onto a carpeted floor. She noted some pain in her lower back for which she use tramadol with minimal improvement. She ultimately went to the emergency room where she received hydrocodone with some improvement in her pain. Plain films of the lower back were normal. She continues to be careful about preventing falls in her home. She has a caregiver to assist her during the daytime. She uses her [facility]. We have discussed continuing with physical therapy, however she does not wish to continue at this time.  Outpatient Encounter Prescriptions as of 01/09/2012  Medication Sig Dispense Refill  . amLODipine (NORVASC) 5 MG tablet TAKE ONE TABLET BY MOUTH EVERY DAY  30 tablet  11  . aspirin 81 MG tablet Take 81 mg by mouth daily.        Marland Kitchen atorvastatin (LIPITOR) 40 MG tablet TAKE ONE TABLET BY MOUTH EVERY DAY  30 tablet  11  . BAYER CONTOUR TEST test strip       . cloNIDine (CATAPRES) 0.2 MG tablet TAKE ONE TABLET BY MOUTH AT BEDTIME  30 tablet  6  . glipiZIDE (GLUCOTROL) 5 MG tablet Take 1 tablet (5 mg total) by mouth daily.  90 tablet  3  . hydrOXYzine (ATARAX/VISTARIL) 25 MG tablet Take 25 mg by mouth daily as needed.        . latanoprost (XALATAN) 0.005 % ophthalmic solution       . levothyroxine (SYNTHROID, LEVOTHROID) 75 MCG tablet TAKE ONE TABLET BY MOUTH EVERY DAY  30 tablet  11  . lidocaine (LIDODERM) 5 % Place 1 patch onto the skin daily. Remove & Discard patch within 12 hours or as directed by MD      . omeprazole (PRILOSEC)  20 MG capsule Take 1 capsule (20 mg total) by mouth 2 (two) times daily.  60 capsule  3  . guaiFENesin-codeine (ROBITUSSIN AC) 100-10 MG/5ML syrup Take 5 mLs by mouth 2 (two) times daily as needed for cough.  240 mL  0  . traMADol (ULTRAM) 50 MG tablet Take 1 tablet (50 mg total) by mouth every 8 (eight) hours as needed for pain.  90 tablet  3  . DISCONTD: warfarin (COUMADIN) 3 MG tablet Take 3 mg by mouth daily. 3 mg daily (12.7.12)        Review of Systems  Constitutional: Negative for fever, chills, appetite change, fatigue and unexpected weight change.  HENT: Negative for ear pain, congestion, sore throat, trouble swallowing, neck pain, voice change and sinus pressure.   Eyes: Negative for visual disturbance.  Respiratory: Negative for cough, shortness of breath, wheezing and stridor.   Cardiovascular: Negative for chest pain, palpitations and leg swelling.  Gastrointestinal: Negative for nausea, vomiting, abdominal pain, diarrhea, constipation, blood in stool, abdominal distention and anal bleeding.  Genitourinary: Negative for dysuria and flank pain.  Musculoskeletal: Positive for arthralgias and gait problem. Negative for myalgias.  Skin: Negative for color change and rash.  Neurological: Positive for weakness. Negative for dizziness and headaches.  Hematological: Negative for adenopathy.  Does not bruise/bleed easily.  Psychiatric/Behavioral: Negative for suicidal ideas, sleep disturbance and dysphoric mood. The patient is not nervous/anxious.    BP 132/60  Pulse 67  Temp(Src) 97.7 F (36.5 C) (Oral)  Ht 5\' 5"  (1.651 m)  Wt 187 lb (84.823 kg)  BMI 31.12 kg/m2  SpO2 98%     Objective:   Physical Exam  Constitutional: She is oriented to person, place, and time. She appears well-developed and well-nourished. No distress.  HENT:  Head: Normocephalic and atraumatic.  Right Ear: External ear normal.  Left Ear: External ear normal.  Nose: Nose normal.  Mouth/Throat: Oropharynx  is clear and moist. No oropharyngeal exudate.  Eyes: Conjunctivae are normal. Pupils are equal, round, and reactive to light. Right eye exhibits no discharge. Left eye exhibits no discharge. No scleral icterus.  Neck: Normal range of motion. Neck supple. No tracheal deviation present. No thyromegaly present.  Cardiovascular: Normal rate, regular rhythm, normal heart sounds and intact distal pulses.  Exam reveals no gallop and no friction rub.   No murmur heard. Pulmonary/Chest: Effort normal and breath sounds normal. No respiratory distress. She has no wheezes. She has no rales. She exhibits no tenderness.  Musculoskeletal: Normal range of motion. She exhibits no edema and no tenderness.  Lymphadenopathy:    She has no cervical adenopathy.  Neurological: She is alert and oriented to person, place, and time. No cranial nerve deficit. She exhibits normal muscle tone. Gait abnormal. Coordination normal.  Skin: Skin is warm and dry. No rash noted. She is not diaphoretic. No erythema. No pallor.  Psychiatric: She has a normal mood and affect. Her behavior is normal. Judgment and thought content normal.          Assessment & Plan:

## 2012-01-09 NOTE — Assessment & Plan Note (Signed)
Patient with diffuse arthralgias secondary to osteoarthritis. She reports significant improvement with tramadol. We'll plan to continue this medication. Followup in one month.

## 2012-01-09 NOTE — Assessment & Plan Note (Signed)
Patient has history of cereal strokes. She was on Coumadin with improvement in her symptoms, however she refused repeated INR monitoring. Coumadin has been discontinued. Will continue aspirin and statin medication. We'll continue diligent control of her blood pressure. Followup in one month.

## 2012-01-09 NOTE — Assessment & Plan Note (Signed)
Patient with gait instability and history of several falls. Her balance and strength were significantly worsened after serial strokes. She participated briefly in physical therapy and I think she would benefit from continuing this, however she refuses at this time. She will continue to use her Alexandra English to help stabilize her balance. She has an emergency alert device should she fall in her home. She also has caregivers from her family which assist her during the day. She will followup in one month.

## 2012-01-29 ENCOUNTER — Telehealth: Payer: Self-pay | Admitting: Internal Medicine

## 2012-01-29 NOTE — Telephone Encounter (Signed)
[phone] Pt called she wants to talk to a nurse about  Blisters on  Left her arm and palm of hand and fingers the arm and hand and fingers ache Offered pt appointment for tomorrow and she wanted to talk to nurse first

## 2012-01-30 NOTE — Telephone Encounter (Signed)
Patient has an appointment on 01/31/12 at 2:00

## 2012-01-30 NOTE — Telephone Encounter (Signed)
No answer, Left pt VM that she should be seen in the office.

## 2012-01-31 ENCOUNTER — Ambulatory Visit (INDEPENDENT_AMBULATORY_CARE_PROVIDER_SITE_OTHER): Payer: Medicare Other | Admitting: Internal Medicine

## 2012-01-31 ENCOUNTER — Encounter: Payer: Self-pay | Admitting: Internal Medicine

## 2012-01-31 ENCOUNTER — Other Ambulatory Visit: Payer: Self-pay | Admitting: Internal Medicine

## 2012-01-31 DIAGNOSIS — L0390 Cellulitis, unspecified: Secondary | ICD-10-CM | POA: Insufficient documentation

## 2012-01-31 DIAGNOSIS — B029 Zoster without complications: Secondary | ICD-10-CM | POA: Insufficient documentation

## 2012-01-31 DIAGNOSIS — L0291 Cutaneous abscess, unspecified: Secondary | ICD-10-CM

## 2012-01-31 MED ORDER — GABAPENTIN 100 MG PO CAPS: 100.0000 mg | ORAL_CAPSULE | Freq: Every day | ORAL | Status: DC

## 2012-01-31 MED ORDER — GENTAMICIN SULFATE 0.1 % EX OINT: TOPICAL_OINTMENT | Freq: Three times a day (TID) | CUTANEOUS | Status: AC

## 2012-01-31 NOTE — Assessment & Plan Note (Signed)
Symptoms are most consistent with zoster. Given that symptoms have been present almost 5 days, likely too late for use of antiviral medication. Will start Neurontin 100 mg at bedtime to help with nerve pain. She will call with update as to symptoms over the next few days. Will titrate medication as needed. Culture was sent today from lesion. Followup when necessary.

## 2012-01-31 NOTE — Progress Notes (Signed)
Subjective:    Patient ID: Alexandra English, female    DOB: 06-08-1927, 78 y.o.   MRN: [id number]  HPI 78 year old female presents with a rash down her left arm. She first noticed this approximately 4 days ago. She describes the rash as red bumps starting from her left shoulder and extending down into her left hand. The area was painful prior to the appearance of the bones. The bumps developing to small blisters which leak fluid and then crust over. They continued to be painful. She has not had any fever or chills. She has not had any weakness in her arm. She notes mild redness around the bumps.  Outpatient Encounter Prescriptions as of 01/31/2012  Medication Sig Dispense Refill  . amLODipine (NORVASC) 5 MG tablet TAKE ONE TABLET BY MOUTH EVERY DAY  30 tablet  11  . aspirin 81 MG tablet Take 81 mg by mouth daily.        Marland Kitchen atorvastatin (LIPITOR) 40 MG tablet TAKE ONE TABLET BY MOUTH EVERY DAY  30 tablet  11  . BAYER CONTOUR TEST test strip       . cloNIDine (CATAPRES) 0.2 MG tablet TAKE ONE TABLET BY MOUTH AT BEDTIME  30 tablet  6  . glipiZIDE (GLUCOTROL) 5 MG tablet Take 1 tablet (5 mg total) by mouth daily.  90 tablet  3  . hydrOXYzine (ATARAX/VISTARIL) 25 MG tablet Take 25 mg by mouth daily as needed.        . latanoprost (XALATAN) 0.005 % ophthalmic solution       . levothyroxine (SYNTHROID, LEVOTHROID) 75 MCG tablet TAKE ONE TABLET BY MOUTH EVERY DAY  30 tablet  11  . lidocaine (LIDODERM) 5 % Place 1 patch onto the skin daily. Remove & Discard patch within 12 hours or as directed by MD      . omeprazole (PRILOSEC) 20 MG capsule Take 1 capsule (20 mg total) by mouth 2 (two) times daily.  60 capsule  3  . gabapentin (NEURONTIN) 100 MG capsule Take 1 capsule (100 mg total) by mouth at bedtime.  90 capsule  3  . gentamicin ointment (GARAMYCIN) 0.1 % Apply topically 3 (three) times daily.  15 g  0  . guaiFENesin-codeine (ROBITUSSIN AC) 100-10 MG/5ML syrup Take 5 mLs by mouth 2 (two) times daily as  needed for cough.  240 mL  0    Review of Systems  Constitutional: Negative for fever, chills and fatigue.  Skin: Positive for color change and rash.  Neurological: Negative for weakness and numbness.   BP 132/66  Pulse 97  Temp(Src) 98.9 F (37.2 C) (Oral)  Wt 191 lb (86.637 kg)  SpO2 98%     Objective:   Physical Exam  Constitutional: She is oriented to person, place, and time. She appears well-developed and well-nourished. No distress.  HENT:  Head: Normocephalic and atraumatic.  Right Ear: External ear normal.  Left Ear: External ear normal.  Nose: Nose normal.  Mouth/Throat: Oropharynx is clear and moist.  Eyes: Conjunctivae are normal. Pupils are equal, round, and reactive to light. Right eye exhibits discharge.  Neck: Normal range of motion. Neck supple.  Pulmonary/Chest: Effort normal.  Musculoskeletal: Normal range of motion. She exhibits no edema and no tenderness.  Neurological: She is alert and oriented to person, place, and time. No cranial nerve deficit. She exhibits normal muscle tone. Coordination normal.  Skin: Skin is warm and dry. Rash noted. She is not diaphoretic. There is erythema. No pallor.  Psychiatric: She has a normal mood and affect. Her behavior is normal. Judgment and thought content normal.          Assessment & Plan:

## 2012-01-31 NOTE — Assessment & Plan Note (Signed)
Some of the lesions of the zoster outbreak has minimal surrounding erythema. Will apply topical gentamicin ointment twice daily to help prevent secondary infection. Patient will call if areas of redness or increasing, if fever develops, or other concerns.

## 2012-02-03 ENCOUNTER — Telehealth: Payer: Self-pay | Admitting: *Deleted

## 2012-02-03 NOTE — Telephone Encounter (Signed)
Rep from [facility] called to report that they cant do the viral culture that was ordered on the patient because the specimen wasn't sent in a viral transport tube, it was sent in a regular collection tube.

## 2012-02-03 NOTE — Telephone Encounter (Signed)
Can you let pt know that lab was not able to run specimen because of problem with tranport tube? I don't think that she needs to return for a recheck because appearance was classic for shingles.

## 2012-02-04 ENCOUNTER — Telehealth: Payer: Self-pay | Admitting: Internal Medicine

## 2012-02-04 NOTE — Telephone Encounter (Signed)
confidential Office Message [address] [place], [place] [address] p. [phone] f. [phone] To: [facility] (Daytime Triage) Fax: [phone] From: [facility] Date/ Time: 02/04/2012 4:30 PM Taken By: Forbes Cellar, CSR Caller: Laddie Aquas Facility: not collected Patient: Kathya, Wilz DOB: 1927-04-03 Phone: [phone] Reason for Call: Returning the call from Dr Dan Humphreys about his mother, Raylea Adcox. Regarding Appointment: Appt Date: Appt Time: Unknown Provider: Reason: Details: Outcome:

## 2012-02-04 NOTE — Telephone Encounter (Signed)
Left message for pt's daughter-in-law to call back.

## 2012-02-08 ENCOUNTER — Other Ambulatory Visit: Payer: Self-pay | Admitting: Internal Medicine

## 2012-02-10 ENCOUNTER — Ambulatory Visit: Payer: Medicare Other | Admitting: Internal Medicine

## 2012-02-13 NOTE — Telephone Encounter (Signed)
Left detailed message explaining to daughter in law. Advised her to call if she had any questions.

## 2012-02-24 ENCOUNTER — Ambulatory Visit (INDEPENDENT_AMBULATORY_CARE_PROVIDER_SITE_OTHER): Payer: Medicare Other | Admitting: Internal Medicine

## 2012-02-24 ENCOUNTER — Encounter: Payer: Self-pay | Admitting: Internal Medicine

## 2012-02-24 DIAGNOSIS — F329 Major depressive disorder, single episode, unspecified: Secondary | ICD-10-CM

## 2012-02-24 DIAGNOSIS — B029 Zoster without complications: Secondary | ICD-10-CM

## 2012-02-24 DIAGNOSIS — E119 Type 2 diabetes mellitus without complications: Secondary | ICD-10-CM

## 2012-02-24 NOTE — Assessment & Plan Note (Signed)
Will check A1c with labs today. Followup in 3 months.

## 2012-02-24 NOTE — Assessment & Plan Note (Signed)
Encouraged her to start Neurontin to help with postherpetic neuralgia. Followup in 3 months.

## 2012-02-24 NOTE — Assessment & Plan Note (Signed)
Offered support today. Patient has not been interested in taking medications to help with depressed mood. Encouraged her to continue to seek activities outside the home. She will followup in 3 months.

## 2012-02-24 NOTE — Progress Notes (Signed)
Subjective:    Patient ID: Alexandra English, female    DOB: 1927/04/04, 78 y.o.   MRN: [id number]  HPI 78 year old female with history of DM, CVA, hypertension, hyperlipidemia, and recent episodes of shingles in her left arm presents for followup. She reports continued pain in her left arm at the site where the vesicles initially appeared. She did not start Neurontin as prescribed because she was concerned about side effects.  She is also concerned today about increase in depressed mood. She notes that she recently had to put her dog down because of chronic health issues. Her daughter been a lifelong companion. She has rare interaction with others outside her home with the exception of her son who calls her on a regular basis.  In regards to diabetes, she did not bring a record of her blood sugars. She reports full compliance with her glipizide.  Outpatient Encounter Prescriptions as of 02/24/2012  Medication Sig Dispense Refill  . amLODipine (NORVASC) 5 MG tablet TAKE ONE TABLET BY MOUTH EVERY DAY  30 tablet  11  . aspirin 81 MG tablet Take 81 mg by mouth daily.        Marland Kitchen atorvastatin (LIPITOR) 40 MG tablet TAKE ONE TABLET BY MOUTH EVERY DAY  30 tablet  11  . BAYER CONTOUR TEST test strip       . cloNIDine (CATAPRES) 0.2 MG tablet TAKE ONE TABLET BY MOUTH AT BEDTIME  30 tablet  6  . gentamicin ointment (GARAMYCIN) 0.1 % Apply topically 3 (three) times daily.  15 g  0  . glipiZIDE (GLUCOTROL) 5 MG tablet Take 1 tablet (5 mg total) by mouth daily.  90 tablet  3  . hydrOXYzine (ATARAX/VISTARIL) 25 MG tablet Take 25 mg by mouth daily as needed.        . latanoprost (XALATAN) 0.005 % ophthalmic solution       . levothyroxine (SYNTHROID, LEVOTHROID) 75 MCG tablet TAKE ONE TABLET BY MOUTH EVERY DAY  30 tablet  11  . lidocaine (LIDODERM) 5 % Place 1 patch onto the skin daily. Remove & Discard patch within 12 hours or as directed by MD      . omeprazole (PRILOSEC) 20 MG capsule TAKE ONE CAPSULE BY MOUTH  TWICE DAILY  30 capsule  6  . traMADol (ULTRAM) 50 MG tablet Take 50 mg by mouth every 6 (six) hours as needed.      . gabapentin (NEURONTIN) 100 MG capsule Take 1 capsule (100 mg total) by mouth at bedtime.  90 capsule  3  . DISCONTD: guaiFENesin-codeine (ROBITUSSIN AC) 100-10 MG/5ML syrup Take 5 mLs by mouth 2 (two) times daily as needed for cough.  240 mL  0    Review of Systems  Constitutional: Negative for fever, chills, appetite change, fatigue and unexpected weight change.  HENT: Negative for ear pain, congestion, sore throat, trouble swallowing, neck pain, voice change and sinus pressure.   Eyes: Negative for visual disturbance.  Respiratory: Negative for cough, shortness of breath, wheezing and stridor.   Cardiovascular: Negative for chest pain, palpitations and leg swelling.  Gastrointestinal: Negative for nausea, vomiting, abdominal pain, diarrhea, constipation, blood in stool, abdominal distention and anal bleeding.  Genitourinary: Negative for dysuria and flank pain.  Musculoskeletal: Positive for myalgias, back pain and arthralgias. Negative for gait problem.  Skin: Negative for color change and rash.  Neurological: Positive for speech difficulty, weakness and numbness. Negative for dizziness and headaches.  Hematological: Negative for adenopathy. Does not bruise/bleed easily.  Psychiatric/Behavioral: Positive for dysphoric mood. Negative for suicidal ideas and sleep disturbance. The patient is not nervous/anxious.    BP 112/50  Pulse 85  Temp(Src) 98.1 F (36.7 C) (Oral)  Ht 5\' 5"  (1.651 m)  Wt 192 lb (87.091 kg)  BMI 31.95 kg/m2  SpO2 96%     Objective:   Physical Exam  Constitutional: She is oriented to person, place, and time. She appears well-developed and well-nourished. No distress.  HENT:  Head: Normocephalic and atraumatic.  Right Ear: External ear normal.  Left Ear: External ear normal.  Nose: Nose normal.  Mouth/Throat: Oropharynx is clear and moist. No  oropharyngeal exudate.  Eyes: Conjunctivae are normal. Pupils are equal, round, and reactive to light. Right eye exhibits no discharge. Left eye exhibits no discharge. No scleral icterus.  Neck: Normal range of motion. Neck supple. No tracheal deviation present. No thyromegaly present.  Cardiovascular: Normal rate, regular rhythm, normal heart sounds and intact distal pulses.  Exam reveals no gallop and no friction rub.   No murmur heard. Pulmonary/Chest: Effort normal and breath sounds normal. No respiratory distress. She has no wheezes. She has no rales. She exhibits no tenderness.  Musculoskeletal: Normal range of motion. She exhibits no edema and no tenderness.  Lymphadenopathy:    She has no cervical adenopathy.  Neurological: She is alert and oriented to person, place, and time. She exhibits normal muscle tone.  Skin: Skin is warm and dry. Rash noted. She is not diaphoretic. There is erythema. No pallor.     Psychiatric: Her behavior is normal. Judgment and thought content normal. Her speech is slurred (chronic after CVA). She exhibits a depressed mood.          Assessment & Plan:

## 2012-02-25 LAB — COMPREHENSIVE METABOLIC PANEL
AST: 18 U/L (ref 0–37)
Albumin: 3.9 g/dL (ref 3.5–5.2)
Alkaline Phosphatase: 88 U/L (ref 39–117)
Glucose, Bld: 157 mg/dL — ABNORMAL HIGH (ref 70–99)
Potassium: 4.3 mEq/L (ref 3.5–5.1)
Sodium: 137 mEq/L (ref 135–145)
Total Bilirubin: 0.5 mg/dL (ref 0.3–1.2)
Total Protein: 7.1 g/dL (ref 6.0–8.3)

## 2012-02-25 LAB — HEMOGLOBIN A1C: Hgb A1c MFr Bld: 7.1 % — ABNORMAL HIGH (ref 4.6–6.5)

## 2012-02-27 ENCOUNTER — Telehealth: Payer: Self-pay | Admitting: Internal Medicine

## 2012-02-27 NOTE — Telephone Encounter (Signed)
Pt called wanted copy of lab results sent to her Mailed 02/26/12/rbh

## 2012-05-02 ENCOUNTER — Emergency Department: Payer: Self-pay | Admitting: Emergency Medicine

## 2012-05-03 LAB — COMPREHENSIVE METABOLIC PANEL
Albumin: 3.7 g/dL (ref 3.4–5.0)
Alkaline Phosphatase: 100 U/L (ref 50–136)
Anion Gap: 13 (ref 7–16)
BUN: 17 mg/dL (ref 7–18)
Bilirubin,Total: 0.4 mg/dL (ref 0.2–1.0)
Calcium, Total: 8.6 mg/dL (ref 8.5–10.1)
Co2: 23 mmol/L (ref 21–32)
EGFR (African American): >60
EGFR (Non-African Amer.): >60
Glucose: 151 mg/dL — ABNORMAL HIGH (ref 65–99)
Osmolality: 280 (ref 275–301)
Potassium: 3.6 mmol/L (ref 3.5–5.1)
SGOT(AST): 17 U/L (ref 15–37)
SGPT (ALT): 19 U/L
Sodium: 138 mmol/L (ref 136–145)
Total Protein: 7.8 g/dL (ref 6.4–8.2)

## 2012-05-03 LAB — CBC
HGB: 13.1 g/dL (ref 12.0–16.0)
MCHC: 32.8 g/dL (ref 32.0–36.0)
Platelet: 211 10*3/uL (ref 150–440)
RBC: 4.31 10*6/uL (ref 3.80–5.20)
RDW: 13.2 % (ref 11.5–14.5)
WBC: 16 10*3/uL — ABNORMAL HIGH (ref 3.6–11.0)

## 2012-05-03 LAB — URINALYSIS, COMPLETE
Blood: NEGATIVE
Leukocyte Esterase: NEGATIVE
Protein: NEGATIVE
RBC,UR: NONE SEEN /HPF (ref 0–5)
Specific Gravity: 1.008 (ref 1.003–1.030)
Squamous Epithelial: <1

## 2012-05-04 ENCOUNTER — Telehealth: Payer: Self-pay | Admitting: *Deleted

## 2012-05-04 NOTE — Telephone Encounter (Signed)
Patient's son is requesting call back from you regarding patient having a fall which she went to the ED; he has only spoken to patient via telephone and she sounds to be weak/SLS

## 2012-05-05 ENCOUNTER — Telehealth: Payer: Self-pay | Admitting: Internal Medicine

## 2012-05-05 NOTE — Telephone Encounter (Signed)
Patient is wanting an appointment for Friday.

## 2012-05-05 NOTE — Telephone Encounter (Signed)
Can you ask him to email me? [email or web address], as I will be out this afternoon and Fri-Mon

## 2012-05-05 NOTE — Telephone Encounter (Signed)
LMOM per Dr. Tilman Neat request to have caller communicate via email/SLS

## 2012-05-06 NOTE — Telephone Encounter (Signed)
LMOM asking patient to call back with more information as to what she needs to be seen for & to reiterate that Dr Dan Humphreys will not be in the office on Friday, 05.24.13 and the office will be closed for the Holiday on Monday, 05.27.13/SLS

## 2012-05-06 NOTE — Telephone Encounter (Signed)
Have you communicated with patient's son via email; patient was informed yesterday that you would not be in the office Friday, was wanting to get more information on issues at hand before returning call/SLS Please advise.

## 2012-05-06 NOTE — Telephone Encounter (Signed)
He has not emailed me.

## 2012-05-07 NOTE — Telephone Encounter (Signed)
Spoke w/patient's son who states he did not receive VM that was left for him to email Dr. Dan Humphreys to discuss patient's health concerns [place] per VO JAW scheduled a double-book on Tuesday, May 12, 2012 [JAW not in office Friday/Holiday Monday] at 11:15am & informed son to have patient here a few minutes early if possible. Caller understood & agreed; states he will email JAW today with information on patient's medical concerns/SLS.  Note complete/please close.       **

## 2012-05-07 NOTE — Telephone Encounter (Signed)
Patient has an appointment on May 28,2013

## 2012-05-07 NOTE — Telephone Encounter (Signed)
Patient called back and stated that her son asked her to get an appointment with Dr. Darrick Huntsman because she has broken out and is nervous. I informed the patient that Dr. Darrick Huntsman does not have any available appointments and that we would call her son to let him know .

## 2012-05-12 ENCOUNTER — Encounter: Payer: Self-pay | Admitting: Internal Medicine

## 2012-05-12 ENCOUNTER — Ambulatory Visit (INDEPENDENT_AMBULATORY_CARE_PROVIDER_SITE_OTHER): Payer: Medicare Other | Admitting: Internal Medicine

## 2012-05-12 VITALS — BP 124/75 | HR 75 | Temp 98.0°F | Resp 16 | Wt 191.2 lb

## 2012-05-12 DIAGNOSIS — M2550 Pain in unspecified joint: Secondary | ICD-10-CM

## 2012-05-12 DIAGNOSIS — R269 Unspecified abnormalities of gait and mobility: Secondary | ICD-10-CM

## 2012-05-12 DIAGNOSIS — D510 Vitamin B12 deficiency anemia due to intrinsic factor deficiency: Secondary | ICD-10-CM

## 2012-05-12 DIAGNOSIS — F419 Anxiety disorder, unspecified: Secondary | ICD-10-CM

## 2012-05-12 DIAGNOSIS — F329 Major depressive disorder, single episode, unspecified: Secondary | ICD-10-CM | POA: Insufficient documentation

## 2012-05-12 DIAGNOSIS — R2681 Unsteadiness on feet: Secondary | ICD-10-CM

## 2012-05-12 DIAGNOSIS — E039 Hypothyroidism, unspecified: Secondary | ICD-10-CM

## 2012-05-12 DIAGNOSIS — F341 Dysthymic disorder: Secondary | ICD-10-CM

## 2012-05-12 LAB — COMPREHENSIVE METABOLIC PANEL
Albumin: 4.1 g/dL (ref 3.5–5.2)
BUN: 17 mg/dL (ref 6–23)
CO2: 23 mEq/L (ref 19–32)
Calcium: 9.5 mg/dL (ref 8.4–10.5)
Chloride: 104 mEq/L (ref 96–112)
Creatinine, Ser: 0.6 mg/dL (ref 0.4–1.2)
GFR: 97.82 mL/min (ref >60.00)
Glucose, Bld: 134 mg/dL — ABNORMAL HIGH (ref 70–99)
Potassium: 3.7 mEq/L (ref 3.5–5.1)

## 2012-05-12 LAB — CBC WITH DIFFERENTIAL/PLATELET
Basophils Relative: 0.5 % (ref 0.0–3.0)
Eosinophils Absolute: 0.3 10*3/uL (ref 0.0–0.7)
Eosinophils Relative: 2.1 % (ref 0.0–5.0)
Hemoglobin: 14 g/dL (ref 12.0–15.0)
Lymphocytes Relative: 21.4 % (ref 12.0–46.0)
MCHC: 34 g/dL (ref 30.0–36.0)
Monocytes Relative: 6.7 % (ref 3.0–12.0)
Neutro Abs: 10 10*3/uL — ABNORMAL HIGH (ref 1.4–7.7)
Neutrophils Relative %: 69.3 % (ref 43.0–77.0)
RBC: 4.4 Mil/uL (ref 3.87–5.11)
WBC: 14.4 10*3/uL — ABNORMAL HIGH (ref 4.5–10.5)

## 2012-05-12 LAB — TSH: TSH: 1.01 u[IU]/mL (ref 0.35–5.50)

## 2012-05-12 LAB — VITAMIN B12: Vitamin B-12: 545 pg/mL (ref 211–911)

## 2012-05-12 MED ORDER — MIRTAZAPINE 15 MG PO TABS: 15.0000 mg | ORAL_TABLET | Freq: Every day | ORAL | Status: DC

## 2012-05-12 NOTE — Assessment & Plan Note (Signed)
Chronic after several strokes. Patient refuses further physical therapy. Encouraged her to consider assisted living given faster access should she have recurrent falls.

## 2012-05-12 NOTE — Progress Notes (Signed)
Subjective:    Patient ID: Alexandra English, female    DOB: [birth year], 78 y.o.   MRN: [id number]  HPI 78 year old female with history of stroke presents after a fall at home last week which resulted in ER visit. She reports that she was trying to sit down on the chair when she slipped and fell, hitting the back of her head. She was unable to get up from that position she was then. She contacted EMS using her cell phone. They were forced to break into her home to rescue her. She was then evaluated in the ER with lab work and CT head which she reports were normal. She presents with her son today to discuss placement in assisted living facility. She reports extreme anxiety living where she does, as she is on the second floor and cannot get down her stairs easily. She is unsure if she would like to move to assisted living. Her son reports that she has difficulty interacting with others. She reports difficulty sleeping and anxiety because of this ongoing situation. She also reports diffuse chronic pain. This is most prominent in her arms and legs. Pain is improved with the use of tramadol.  Outpatient Encounter Prescriptions as of 05/12/2012  Medication Sig Dispense Refill  . amLODipine (NORVASC) 5 MG tablet TAKE ONE TABLET BY MOUTH EVERY DAY  30 tablet  11  . aspirin 81 MG tablet Take 81 mg by mouth daily.        Marland Kitchen atorvastatin (LIPITOR) 40 MG tablet TAKE ONE TABLET BY MOUTH EVERY DAY  30 tablet  11  . BAYER CONTOUR TEST test strip       . cloNIDine (CATAPRES) 0.2 MG tablet TAKE ONE TABLET BY MOUTH AT BEDTIME  30 tablet  6  . glipiZIDE (GLUCOTROL) 5 MG tablet Take 1 tablet (5 mg total) by mouth daily.  90 tablet  3  . hydrOXYzine (ATARAX/VISTARIL) 25 MG tablet Take 25 mg by mouth daily as needed.        Marland Kitchen levothyroxine (SYNTHROID, LEVOTHROID) 75 MCG tablet TAKE ONE TABLET BY MOUTH EVERY DAY  30 tablet  11  . omeprazole (PRILOSEC) 20 MG capsule TAKE ONE CAPSULE BY MOUTH TWICE DAILY  30 capsule  6  .  traMADol (ULTRAM) 50 MG tablet Take 50 mg by mouth every 6 (six) hours as needed.      . gabapentin (NEURONTIN) 100 MG capsule Take 1 capsule (100 mg total) by mouth at bedtime.  90 capsule  3  . gentamicin ointment (GARAMYCIN) 0.1 % Apply topically 3 (three) times daily.  15 g  0  . latanoprost (XALATAN) 0.005 % ophthalmic solution       . lidocaine (LIDODERM) 5 % Place 1 patch onto the skin daily. Remove & Discard patch within 12 hours or as directed by MD      . mirtazapine (REMERON) 15 MG tablet Take 1 tablet (15 mg total) by mouth at bedtime.  30 tablet  3    Review of Systems  Constitutional: Positive for fatigue. Negative for fever, chills, appetite change and unexpected weight change.  HENT: Negative for ear pain, congestion, sore throat, trouble swallowing, neck pain, voice change and sinus pressure.   Eyes: Negative for visual disturbance.  Respiratory: Negative for cough, shortness of breath, wheezing and stridor.   Cardiovascular: Negative for chest pain, palpitations and leg swelling.  Gastrointestinal: Negative for nausea, vomiting, abdominal pain, diarrhea, constipation, blood in stool, abdominal distention and anal bleeding.  Genitourinary: Negative  for dysuria and flank pain.  Musculoskeletal: Positive for myalgias and arthralgias. Negative for gait problem.  Skin: Negative for color change and rash.  Neurological: Negative for dizziness and headaches.  Hematological: Negative for adenopathy. Does not bruise/bleed easily.  Psychiatric/Behavioral: Positive for behavioral problems, dysphoric mood and agitation. Negative for suicidal ideas and sleep disturbance. The patient is nervous/anxious.    BP 124/75  Pulse 75  Temp(Src) 98 F (36.7 C) (Oral)  Resp 16  Wt 191 lb 4 oz (86.75 kg)  SpO2 98%     Objective:   Physical Exam  Constitutional: She is oriented to person, place, and time. She appears well-developed and well-nourished. No distress.  HENT:  Head:  Normocephalic and atraumatic.  Right Ear: External ear normal.  Left Ear: External ear normal.  Nose: Nose normal.  Mouth/Throat: Oropharynx is clear and moist. No oropharyngeal exudate.  Eyes: Conjunctivae are normal. Pupils are equal, round, and reactive to light. Right eye exhibits no discharge. Left eye exhibits no discharge. No scleral icterus.  Neck: Normal range of motion. Neck supple. No tracheal deviation present. No thyromegaly present.  Cardiovascular: Normal rate, regular rhythm, normal heart sounds and intact distal pulses.  Exam reveals no gallop and no friction rub.   No murmur heard. Pulmonary/Chest: Effort normal and breath sounds normal. No respiratory distress. She has no wheezes. She has no rales. She exhibits no tenderness.  Musculoskeletal: Normal range of motion. She exhibits no edema and no tenderness.  Lymphadenopathy:    She has no cervical adenopathy.  Neurological: She is alert and oriented to person, place, and time. No cranial nerve deficit. She exhibits normal muscle tone. Coordination normal.  Skin: Skin is warm and dry. No rash noted. She is not diaphoretic. No erythema. No pallor.  Psychiatric: Her speech is normal. Judgment and thought content normal. Her mood appears anxious. She is agitated. Cognition and memory are normal. She exhibits a depressed mood.          Assessment & Plan:

## 2012-05-12 NOTE — Assessment & Plan Note (Signed)
Will try adding Remeron to see if any improvement with anxiety and depression. Over 25 minutes face-to-face contact with patient and her son today discussing potential options for her living situation. Offered to set up social work referral which she refused. Son will plan to bring his mother to assisted living facility to visit today. Follow up 1 month.

## 2012-05-12 NOTE — Assessment & Plan Note (Signed)
Chronic. Symptoms likely worsened by anxiety. Will continue tramadol. Followup one month.

## 2012-05-13 ENCOUNTER — Other Ambulatory Visit: Payer: Self-pay | Admitting: *Deleted

## 2012-05-13 MED ORDER — AZITHROMYCIN 250 MG PO TABS: ORAL_TABLET | ORAL | Status: AC

## 2012-05-15 NOTE — Telephone Encounter (Signed)
[name] SCATES, CMA 05/15/2012 11:20 AM Incomplete  Spoke w/patient's son who states he did not receive VM that was left for him to email Dr. Dan Humphreys to discuss patient's health concerns [place] per VO JAW scheduled a double-book on Tuesday, May 12, 2012 [JAW not in office Friday/Holiday Monday] at 11:15am & informed son to have patient here a few minutes early if possible.  Caller understood & agreed; states he will email JAW today with information on patient's medical concerns/SLS

## 2012-06-03 ENCOUNTER — Ambulatory Visit: Payer: Medicare Other | Admitting: Internal Medicine

## 2012-06-04 ENCOUNTER — Ambulatory Visit (INDEPENDENT_AMBULATORY_CARE_PROVIDER_SITE_OTHER): Payer: Medicare Other | Admitting: Internal Medicine

## 2012-06-04 ENCOUNTER — Encounter: Payer: Self-pay | Admitting: Internal Medicine

## 2012-06-04 VITALS — BP 110/60 | HR 72 | Temp 98.2°F | Ht 65.0 in | Wt 191.5 lb

## 2012-06-04 DIAGNOSIS — F419 Anxiety disorder, unspecified: Secondary | ICD-10-CM

## 2012-06-04 DIAGNOSIS — F341 Dysthymic disorder: Secondary | ICD-10-CM

## 2012-06-04 DIAGNOSIS — H539 Unspecified visual disturbance: Secondary | ICD-10-CM

## 2012-06-04 NOTE — Assessment & Plan Note (Signed)
Symptoms markedly improved. Will continue Remeron. Follow up 3 months.

## 2012-06-04 NOTE — Assessment & Plan Note (Signed)
Patient will set up eye exam with her ophthalmologist.

## 2012-06-04 NOTE — Progress Notes (Signed)
Subjective:    Patient ID: Alexandra English, female    DOB: 1927/10/28, 78 y.o.   MRN: [id number]  HPI 78 year old female with history of recurrent stroke and anxiety/depression presents for followup. She reports significant improvement in her mood since starting Remeron. She is sleeping much better than before. She is with her son today and they have recently decided that she will move to an assisted living facility. She is looking forward to this. She will have her own apartment but have meals provided as well as social activities. Her only other concern today is some intermittent blurred vision. She notes that she has not regularly followed up with her ophthalmologist Dr. Maricela Bo. She denies any loss of vision. She does not have eye pain or headache.  Outpatient Encounter Prescriptions as of 06/04/2012  Medication Sig Dispense Refill  . amLODipine (NORVASC) 5 MG tablet TAKE ONE TABLET BY MOUTH EVERY DAY  30 tablet  11  . aspirin 81 MG tablet Take 81 mg by mouth daily.        Marland Kitchen atorvastatin (LIPITOR) 40 MG tablet TAKE ONE TABLET BY MOUTH EVERY DAY  30 tablet  11  . BAYER CONTOUR TEST test strip       . cloNIDine (CATAPRES) 0.2 MG tablet TAKE ONE TABLET BY MOUTH AT BEDTIME  30 tablet  6  . gabapentin (NEURONTIN) 100 MG capsule Take 1 capsule (100 mg total) by mouth at bedtime.  90 capsule  3  . gentamicin ointment (GARAMYCIN) 0.1 % Apply topically 3 (three) times daily.  15 g  0  . glipiZIDE (GLUCOTROL) 5 MG tablet Take 1 tablet (5 mg total) by mouth daily.  90 tablet  3  . hydrOXYzine (ATARAX/VISTARIL) 25 MG tablet Take 25 mg by mouth daily as needed.        . latanoprost (XALATAN) 0.005 % ophthalmic solution       . levothyroxine (SYNTHROID, LEVOTHROID) 75 MCG tablet TAKE ONE TABLET BY MOUTH EVERY DAY  30 tablet  11  . lidocaine (LIDODERM) 5 % Place 1 patch onto the skin daily. Remove & Discard patch within 12 hours or as directed by MD      . mirtazapine (REMERON) 15 MG tablet Take 1 tablet (15  mg total) by mouth at bedtime.  30 tablet  3  . omeprazole (PRILOSEC) 20 MG capsule TAKE ONE CAPSULE BY MOUTH TWICE DAILY  30 capsule  6  . traMADol (ULTRAM) 50 MG tablet Take 50 mg by mouth every 6 (six) hours as needed.        Review of Systems  Constitutional: Negative for fever, chills, appetite change, fatigue and unexpected weight change.  HENT: Negative for neck pain.   Eyes: Positive for visual disturbance. Negative for photophobia, pain, discharge, redness and itching.  Respiratory: Negative for cough and shortness of breath.   Cardiovascular: Negative for chest pain, palpitations and leg swelling.  Gastrointestinal: Negative for abdominal pain.  Genitourinary: Negative for dysuria and flank pain.  Musculoskeletal: Negative for myalgias, arthralgias and gait problem.  Skin: Negative for color change and rash.  Neurological: Negative for dizziness and headaches.  Hematological: Negative for adenopathy. Does not bruise/bleed easily.  Psychiatric/Behavioral: Negative for suicidal ideas, disturbed wake/sleep cycle and dysphoric mood. The patient is not nervous/anxious.    BP 110/60  Pulse 72  Temp 98.2 F (36.8 C) (Oral)  Ht 5\' 5"  (1.651 m)  Wt 191 lb 8 oz (86.864 kg)  BMI 31.87 kg/m2  SpO2 97%  Objective:   Physical Exam  Constitutional: She is oriented to person, place, and time. She appears well-developed and well-nourished. No distress.  HENT:  Head: Normocephalic and atraumatic.  Right Ear: External ear normal.  Left Ear: External ear normal.  Nose: Nose normal.  Mouth/Throat: Oropharynx is clear and moist. No oropharyngeal exudate.  Eyes: Conjunctivae, EOM and lids are normal. Pupils are equal, round, and reactive to light. Right eye exhibits no discharge. Left eye exhibits no discharge. No scleral icterus.  Neck: Normal range of motion. Neck supple. No tracheal deviation present. No thyromegaly present.  Cardiovascular: Normal rate, regular rhythm, normal heart  sounds and intact distal pulses.  Exam reveals no gallop and no friction rub.   No murmur heard. Pulmonary/Chest: Effort normal and breath sounds normal. No respiratory distress. She has no wheezes. She has no rales. She exhibits no tenderness.  Musculoskeletal: Normal range of motion. She exhibits no edema and no tenderness.  Lymphadenopathy:    She has no cervical adenopathy.  Neurological: She is alert and oriented to person, place, and time. No cranial nerve deficit. She exhibits normal muscle tone. Coordination normal.  Skin: Skin is warm and dry. No rash noted. She is not diaphoretic. No erythema. No pallor.  Psychiatric: She has a normal mood and affect. Her behavior is normal. Judgment and thought content normal.          Assessment & Plan:

## 2012-06-22 ENCOUNTER — Telehealth: Payer: Self-pay | Admitting: Internal Medicine

## 2012-06-22 NOTE — Telephone Encounter (Signed)
Dr. Dan Humphreys is this ok to do?  If so let me know what diagnosis to use and I will print out order.

## 2012-06-22 NOTE — Telephone Encounter (Signed)
This is fine to order. I would use gait instability as diagnosis code.

## 2012-06-22 NOTE — Telephone Encounter (Signed)
Patient needs hospital bed to be sent to [facility]. Please put in orders with [facility] [phone].

## 2012-06-23 ENCOUNTER — Telehealth: Payer: Self-pay | Admitting: *Deleted

## 2012-06-23 NOTE — Telephone Encounter (Signed)
Order written, given to Dr. Dan Humphreys for signature.

## 2012-06-23 NOTE — Telephone Encounter (Signed)
Order faxed to [facility] at [phone].

## 2012-06-23 NOTE — Telephone Encounter (Signed)
I really don't think that she will qualify, then. She is ambulatory and independent. If her son feels strongly about this, and can justify a specific reason, we can write a letter.

## 2012-06-23 NOTE — Telephone Encounter (Signed)
Received a call from [facility] stating that the order that I faxed to them today for a hospital bed will not be covered by Medicare.  Stated that Dr. Dan Humphreys needs to create a referral note giving medical reasoning why patient needs the hospital bed ex. Need of frequent repositioning, circulatory problems, etc.  Please advise.  This info can be faxed to [facility] at [phone].

## 2012-06-24 ENCOUNTER — Other Ambulatory Visit: Payer: Self-pay | Admitting: Internal Medicine

## 2012-06-24 NOTE — Telephone Encounter (Signed)
Left message on cell phone voicemail for Gery Pray to return call.

## 2012-06-25 ENCOUNTER — Encounter: Payer: Self-pay | Admitting: Internal Medicine

## 2012-06-25 NOTE — Telephone Encounter (Signed)
OK. That is fine. We can write letter or Rx, whichever is needed for this.

## 2012-06-25 NOTE — Telephone Encounter (Signed)
Spoke with patients son Gery Pray) and he stated that he spoke to someone from [facility] and they were pretty certain that patients insurance would cover the cost of the bed or part of the cost.  He stated that his mom has had several strokes, has trouble getting in and out of bed, and has bad acid reflux.  She seems to think that she will sleep better if she has a bed that she can tilt.  Please advise.

## 2012-06-25 NOTE — Telephone Encounter (Signed)
Per [facility] they will need a letter.

## 2012-06-26 NOTE — Telephone Encounter (Signed)
Letter faxed to [facility] at [phone].

## 2012-07-21 ENCOUNTER — Other Ambulatory Visit: Payer: Self-pay | Admitting: *Deleted

## 2012-07-21 MED ORDER — BLOOD GLUCOSE MONITOR KIT: PACK | Status: DC

## 2012-08-04 ENCOUNTER — Other Ambulatory Visit: Payer: Self-pay | Admitting: *Deleted

## 2012-08-04 MED ORDER — GLIPIZIDE 5 MG PO TABS: 5.0000 mg | ORAL_TABLET | Freq: Every day | ORAL | Status: DC

## 2012-08-05 ENCOUNTER — Other Ambulatory Visit: Payer: Self-pay | Admitting: *Deleted

## 2012-08-05 MED ORDER — HYDROXYZINE HCL 25 MG PO TABS: 25.0000 mg | ORAL_TABLET | Freq: Every day | ORAL | Status: DC | PRN

## 2012-08-20 ENCOUNTER — Telehealth: Payer: Self-pay | Admitting: *Deleted

## 2012-08-20 NOTE — Telephone Encounter (Signed)
Fine to fax in Rx for adult pull-ups 4 per day, disp 120 per month.

## 2012-08-20 NOTE — Telephone Encounter (Signed)
[facility] called stating that they need a Rx order for pull ups.  Patient can get them at a discounted price if they have a Rx on file.  Order can be faxed to [facility] at [phone].

## 2012-08-21 ENCOUNTER — Encounter: Payer: Self-pay | Admitting: *Deleted

## 2012-08-21 NOTE — Telephone Encounter (Signed)
Printed order, signed by Dr. Dan Humphreys and faxed to number below.

## 2012-08-28 ENCOUNTER — Other Ambulatory Visit: Payer: Self-pay | Admitting: Internal Medicine

## 2012-09-04 ENCOUNTER — Ambulatory Visit (INDEPENDENT_AMBULATORY_CARE_PROVIDER_SITE_OTHER): Payer: Medicare Other | Admitting: Internal Medicine

## 2012-09-04 ENCOUNTER — Encounter: Payer: Self-pay | Admitting: Internal Medicine

## 2012-09-04 VITALS — BP 126/70 | HR 70 | Temp 98.2°F | Ht 65.0 in | Wt 197.2 lb

## 2012-09-04 DIAGNOSIS — E119 Type 2 diabetes mellitus without complications: Secondary | ICD-10-CM

## 2012-09-04 DIAGNOSIS — F341 Dysthymic disorder: Secondary | ICD-10-CM

## 2012-09-04 DIAGNOSIS — M2550 Pain in unspecified joint: Secondary | ICD-10-CM

## 2012-09-04 DIAGNOSIS — F32A Depression, unspecified: Secondary | ICD-10-CM

## 2012-09-04 DIAGNOSIS — Z23 Encounter for immunization: Secondary | ICD-10-CM

## 2012-09-04 DIAGNOSIS — F329 Major depressive disorder, single episode, unspecified: Secondary | ICD-10-CM

## 2012-09-04 DIAGNOSIS — E118 Type 2 diabetes mellitus with unspecified complications: Secondary | ICD-10-CM

## 2012-09-04 NOTE — Progress Notes (Signed)
Subjective:    Patient ID: Alexandra English, female    DOB: 10-26-1927, 77 y.o.   MRN: [id number]  HPI 78 year old female with history of diabetes, stroke, depression presents for followup. She reports recent worsening of symptoms of depression and anxiety after stopping her Remeron. She was concerned about side effects of this medication and self stopped the medication. She has since resumed her Remeron and reports that symptoms are improving. She has been doing well at assisted living facility.  In regards to diabetes, she reports blood sugars have been well-controlled. She reports full compliance with medications. She is concerned about callus formation on her right lateral foot. She does not have a history of diabetic ulcers. She reports some pain in her right lateral foot and right ankle.    Outpatient Encounter Prescriptions as of 09/04/2012  Medication Sig Dispense Refill  . amLODipine (NORVASC) 5 MG tablet TAKE ONE TABLET BY MOUTH EVERY DAY  30 tablet  11  . aspirin 81 MG tablet Take 81 mg by mouth daily.        Marland Kitchen atorvastatin (LIPITOR) 40 MG tablet TAKE ONE TABLET BY MOUTH EVERY DAY  30 tablet  10  . BAYER CONTOUR TEST test strip       . Blood Glucose Monitoring Suppl (BLOOD GLUCOSE MONITOR KIT) KIT To check blood sugar two times daily 250.00  1 each  0  . cloNIDine (CATAPRES) 0.2 MG tablet TAKE ONE TABLET BY MOUTH AT BEDTIME  30 tablet  3  . gabapentin (NEURONTIN) 100 MG capsule Take 1 capsule (100 mg total) by mouth at bedtime.  90 capsule  3  . gentamicin ointment (GARAMYCIN) 0.1 % Apply topically 3 (three) times daily.  15 g  0  . glipiZIDE (GLUCOTROL) 5 MG tablet Take 1 tablet (5 mg total) by mouth daily.  90 tablet  3  . hydrOXYzine (ATARAX/VISTARIL) 25 MG tablet Take 1 tablet (25 mg total) by mouth daily as needed.  90 tablet  3  . latanoprost (XALATAN) 0.005 % ophthalmic solution       . levothyroxine (SYNTHROID, LEVOTHROID) 75 MCG tablet TAKE ONE TABLET BY MOUTH EVERY DAY  30  tablet  11  . lidocaine (LIDODERM) 5 % Place 1 patch onto the skin daily. Remove & Discard patch within 12 hours or as directed by MD      . mirtazapine (REMERON) 15 MG tablet Take 15 mg by mouth at bedtime.      Marland Kitchen omeprazole (PRILOSEC) 20 MG capsule TAKE ONE CAPSULE BY MOUTH TWICE DAILY  30 capsule  5  . traMADol (ULTRAM) 50 MG tablet Take 50 mg by mouth every 6 (six) hours as needed.      Marland Kitchen DISCONTD: mirtazapine (REMERON) 15 MG tablet Take 1 tablet (15 mg total) by mouth at bedtime.  30 tablet  3   BP 126/70  Pulse 70  Temp 98.2 F (36.8 C) (Oral)  Ht 5\' 5"  (1.651 m)  Wt 197 lb 4 oz (89.472 kg)  BMI 32.82 kg/m2  SpO2 98%  Review of Systems  Constitutional: Negative for fever, chills, appetite change, fatigue and unexpected weight change.  HENT: Negative for ear pain, congestion, sore throat, trouble swallowing, neck pain, voice change and sinus pressure.   Eyes: Negative for visual disturbance.  Respiratory: Negative for cough, shortness of breath, wheezing and stridor.   Cardiovascular: Negative for chest pain, palpitations and leg swelling.  Gastrointestinal: Negative for nausea, vomiting, abdominal pain, diarrhea, constipation, blood in stool, abdominal  distention and anal bleeding.  Genitourinary: Negative for dysuria and flank pain.  Musculoskeletal: Positive for arthralgias. Negative for myalgias and gait problem.  Skin: Negative for color change and rash.  Neurological: Negative for dizziness and headaches.  Hematological: Negative for adenopathy. Does not bruise/bleed easily.  Psychiatric/Behavioral: Negative for suicidal ideas, disturbed wake/sleep cycle and dysphoric mood. The patient is nervous/anxious.        Objective:   Physical Exam  Constitutional: She is oriented to person, place, and time. She appears well-developed and well-nourished. No distress.  HENT:  Head: Normocephalic and atraumatic.  Right Ear: External ear normal.  Left Ear: External ear normal.    Nose: Nose normal.  Mouth/Throat: Oropharynx is clear and moist. No oropharyngeal exudate.  Eyes: Conjunctivae normal are normal. Pupils are equal, round, and reactive to light. Right eye exhibits no discharge. Left eye exhibits no discharge. No scleral icterus.  Neck: Normal range of motion. Neck supple. No tracheal deviation present. No thyromegaly present.  Cardiovascular: Normal rate, regular rhythm, normal heart sounds and intact distal pulses.  Exam reveals no gallop and no friction rub.   No murmur heard. Pulmonary/Chest: Effort normal and breath sounds normal. No respiratory distress. She has no wheezes. She has no rales. She exhibits no tenderness.  Musculoskeletal: Normal range of motion. She exhibits no edema and no tenderness.  Lymphadenopathy:    She has no cervical adenopathy.  Neurological: She is alert and oriented to person, place, and time. No cranial nerve deficit. She exhibits normal muscle tone. Coordination normal.  Skin: Skin is warm and dry. No rash noted. She is not diaphoretic. No erythema. No pallor.     Psychiatric: She has a normal mood and affect. Her behavior is normal. Judgment and thought content normal.          Assessment & Plan:

## 2012-09-04 NOTE — Assessment & Plan Note (Signed)
Patient with arthralgia particularly in her right foot. Encouraged use of tramadol to help with symptoms. Will also put in referral for diabetic shoes and orthotics.

## 2012-09-04 NOTE — Assessment & Plan Note (Signed)
Symptoms recently worsen when patient stopped taking Remeron. Symptoms are improving now that she is back on medication. Encourage compliance with medication. Followup 3 months.

## 2012-09-04 NOTE — Assessment & Plan Note (Addendum)
Patient reports good control of blood sugars. Will check A1c with labs today. Pre-ulcerative callus formation noted right lateral foot. Will fill out paperwork for diabetic shoes and orthotics.

## 2012-09-09 ENCOUNTER — Other Ambulatory Visit: Payer: Medicare Other

## 2012-09-16 ENCOUNTER — Other Ambulatory Visit: Payer: Medicare Other

## 2012-09-18 ENCOUNTER — Other Ambulatory Visit: Payer: Medicare Other

## 2012-09-25 ENCOUNTER — Encounter: Payer: Self-pay | Admitting: Internal Medicine

## 2012-09-25 ENCOUNTER — Telehealth: Payer: Self-pay | Admitting: *Deleted

## 2012-09-25 NOTE — Telephone Encounter (Signed)
Letter written and sent to you

## 2012-09-25 NOTE — Telephone Encounter (Signed)
Morrie Sheldon called requesting most recent office visit note and a letter stating that patient has diabetes and why she needs diabetic shoes.  She is requesting this information before the end of next week.  This info can be faxed to [phone].

## 2012-09-25 NOTE — Telephone Encounter (Signed)
Letter printed along with last OV note, faxed to [facility] at [phone].

## 2012-09-28 ENCOUNTER — Other Ambulatory Visit: Payer: Self-pay | Admitting: Internal Medicine

## 2012-10-20 ENCOUNTER — Other Ambulatory Visit: Payer: Self-pay | Admitting: Internal Medicine

## 2012-11-05 ENCOUNTER — Other Ambulatory Visit: Payer: Self-pay | Admitting: General Practice

## 2012-11-05 NOTE — Telephone Encounter (Signed)
Made in error

## 2012-11-06 ENCOUNTER — Other Ambulatory Visit: Payer: Medicare Other

## 2012-11-25 ENCOUNTER — Other Ambulatory Visit: Payer: Self-pay | Admitting: Internal Medicine

## 2012-11-26 NOTE — Telephone Encounter (Signed)
Meds filled

## 2013-01-13 ENCOUNTER — Ambulatory Visit (INDEPENDENT_AMBULATORY_CARE_PROVIDER_SITE_OTHER): Payer: Medicare Other | Admitting: Internal Medicine

## 2013-01-13 ENCOUNTER — Encounter: Payer: Self-pay | Admitting: Internal Medicine

## 2013-01-13 VITALS — BP 160/77 | HR 73 | Temp 97.7°F | Ht 64.0 in | Wt 197.0 lb

## 2013-01-13 DIAGNOSIS — I10 Essential (primary) hypertension: Secondary | ICD-10-CM

## 2013-01-13 DIAGNOSIS — F341 Dysthymic disorder: Secondary | ICD-10-CM

## 2013-01-13 DIAGNOSIS — E039 Hypothyroidism, unspecified: Secondary | ICD-10-CM

## 2013-01-13 DIAGNOSIS — E118 Type 2 diabetes mellitus with unspecified complications: Secondary | ICD-10-CM

## 2013-01-13 DIAGNOSIS — E119 Type 2 diabetes mellitus without complications: Secondary | ICD-10-CM

## 2013-01-13 DIAGNOSIS — L84 Corns and callosities: Secondary | ICD-10-CM | POA: Insufficient documentation

## 2013-01-13 DIAGNOSIS — E785 Hyperlipidemia, unspecified: Secondary | ICD-10-CM

## 2013-01-13 DIAGNOSIS — F419 Anxiety disorder, unspecified: Secondary | ICD-10-CM

## 2013-01-13 MED ORDER — ATORVASTATIN CALCIUM 40 MG PO TABS: 40.0000 mg | ORAL_TABLET | Freq: Every day | ORAL | Status: DC

## 2013-01-13 MED ORDER — GLIPIZIDE 5 MG PO TABS: 5.0000 mg | ORAL_TABLET | Freq: Every day | ORAL | Status: DC

## 2013-01-13 MED ORDER — AMLODIPINE BESYLATE 5 MG PO TABS: 10.0000 mg | ORAL_TABLET | Freq: Every day | ORAL | Status: DC

## 2013-01-13 MED ORDER — CLONIDINE HCL 0.2 MG PO TABS: 0.2000 mg | ORAL_TABLET | Freq: Every day | ORAL | Status: DC

## 2013-01-13 MED ORDER — HYDROXYZINE HCL 25 MG PO TABS: 25.0000 mg | ORAL_TABLET | Freq: Every day | ORAL | Status: DC | PRN

## 2013-01-13 MED ORDER — LEVOTHYROXINE SODIUM 75 MCG PO TABS: 75.0000 ug | ORAL_TABLET | Freq: Every day | ORAL | Status: DC

## 2013-01-13 MED ORDER — OMEPRAZOLE 20 MG PO CPDR: 20.0000 mg | DELAYED_RELEASE_CAPSULE | Freq: Every day | ORAL | Status: DC

## 2013-01-13 MED ORDER — TRAMADOL HCL 50 MG PO TABS: 50.0000 mg | ORAL_TABLET | Freq: Four times a day (QID) | ORAL | Status: DC | PRN

## 2013-01-13 MED ORDER — MIRTAZAPINE 15 MG PO TABS: 15.0000 mg | ORAL_TABLET | Freq: Every day | ORAL | Status: DC

## 2013-01-13 NOTE — Progress Notes (Signed)
Subjective:    Patient ID: Alexandra English, female    DOB: 1927-06-26, 79 y.o.   MRN: [id number]  HPI 79YO female with h/o CVA, DM, HTN, depression presents for follow up. Doing well. Living at [facility].  Would like to get a dog or cat for companionship, but, for now, son has encouraged her to hold off. Notes some new friendships at her facility. Reports appetite excellent. Reports BG slightly higher, typically 130s fasting. Notes compliance with meds.  Continues to have some callous formation bilateral feet, left>right. Would like to see podiatry for callous removal.  Outpatient Encounter Prescriptions as of 01/13/2013  Medication Sig Dispense Refill  . amLODipine (NORVASC) 5 MG tablet Take 2 tablets (10 mg total) by mouth daily.  90 tablet  4  . aspirin 81 MG tablet Take 81 mg by mouth daily.        Marland Kitchen atorvastatin (LIPITOR) 40 MG tablet Take 1 tablet (40 mg total) by mouth daily.  90 tablet  4  . BAYER CONTOUR TEST test strip       . Blood Glucose Monitoring Suppl (BLOOD GLUCOSE MONITOR KIT) KIT To check blood sugar two times daily 250.00  1 each  0  . cloNIDine (CATAPRES) 0.2 MG tablet Take 1 tablet (0.2 mg total) by mouth at bedtime.  90 tablet  4  . gentamicin ointment (GARAMYCIN) 0.1 % Apply topically 3 (three) times daily.  15 g  0  . glipiZIDE (GLUCOTROL) 5 MG tablet Take 1 tablet (5 mg total) by mouth daily.  90 tablet  4  . latanoprost (XALATAN) 0.005 % ophthalmic solution       . levothyroxine (SYNTHROID, LEVOTHROID) 75 MCG tablet Take 1 tablet (75 mcg total) by mouth daily.  90 tablet  4  . omeprazole (PRILOSEC) 20 MG capsule Take 1 capsule (20 mg total) by mouth daily.  90 capsule  4  . traMADol (ULTRAM) 50 MG tablet Take 1 tablet (50 mg total) by mouth every 6 (six) hours as needed.  180 tablet  4  . hydrOXYzine (ATARAX/VISTARIL) 25 MG tablet Take 1 tablet (25 mg total) by mouth daily as needed.  90 tablet  3  . lidocaine (LIDODERM) 5 % Place 1 patch onto the skin daily.  Remove & Discard patch within 12 hours or as directed by MD      . mirtazapine (REMERON) 15 MG tablet Take 1 tablet (15 mg total) by mouth at bedtime.  90 tablet  4   BP 160/77  Pulse 73  Temp 97.7 F (36.5 C) (Oral)  Ht 5\' 4"  (1.626 m)  Wt 197 lb (89.359 kg)  BMI 33.82 kg/m2  SpO2 97%  Review of Systems  Constitutional: Negative for fever, chills, appetite change, fatigue and unexpected weight change.  HENT: Negative for ear pain, congestion, sore throat, trouble swallowing, neck pain, voice change and sinus pressure.   Eyes: Negative for visual disturbance.  Respiratory: Negative for cough, shortness of breath, wheezing and stridor.   Cardiovascular: Negative for chest pain, palpitations and leg swelling.  Gastrointestinal: Negative for nausea, vomiting, abdominal pain, diarrhea, constipation, blood in stool, abdominal distention and anal bleeding.  Genitourinary: Negative for dysuria and flank pain.  Musculoskeletal: Negative for myalgias, arthralgias and gait problem.  Skin: Negative for color change and rash.  Neurological: Negative for dizziness and headaches.  Hematological: Negative for adenopathy. Does not bruise/bleed easily.  Psychiatric/Behavioral: Negative for suicidal ideas, sleep disturbance and dysphoric mood. The patient is not nervous/anxious.  Objective:   Physical Exam  Constitutional: She is oriented to person, place, and time. She appears well-developed and well-nourished. No distress.  HENT:  Head: Normocephalic and atraumatic.  Right Ear: External ear normal.  Left Ear: External ear normal.  Nose: Nose normal.  Mouth/Throat: Oropharynx is clear and moist. No oropharyngeal exudate.  Eyes: Conjunctivae normal are normal. Pupils are equal, round, and reactive to light. Right eye exhibits no discharge. Left eye exhibits no discharge. No scleral icterus.  Neck: Normal range of motion. Neck supple. No tracheal deviation present. No thyromegaly present.    Cardiovascular: Normal rate, regular rhythm, normal heart sounds and intact distal pulses.  Exam reveals no gallop and no friction rub.   No murmur heard. Pulmonary/Chest: Effort normal and breath sounds normal. No respiratory distress. She has no wheezes. She has no rales. She exhibits no tenderness.  Musculoskeletal: Normal range of motion. She exhibits no edema and no tenderness.  Lymphadenopathy:    She has no cervical adenopathy.  Neurological: She is alert and oriented to person, place, and time. No cranial nerve deficit. She exhibits normal muscle tone. Coordination normal.  Skin: Skin is warm and dry. No rash noted. She is not diaphoretic. No erythema. No pallor.  Psychiatric: She has a normal mood and affect. Her behavior is normal. Judgment and thought content normal.          Assessment & Plan:

## 2013-01-13 NOTE — Assessment & Plan Note (Signed)
Pt with h/o intermittent callous formation bilateral feet. Will set up podiatry evaluation. Question if she would benefit from callous removal and/or orthotics for her shoes.

## 2013-01-13 NOTE — Assessment & Plan Note (Signed)
Symptoms seem much improved with move to assisted living facility. Will continue Remeron at night. Follow up 3 months and prn.

## 2013-01-13 NOTE — Assessment & Plan Note (Signed)
Will check A1c with labs today. Continue current medication. Follow up 3 months and prn.

## 2013-01-13 NOTE — Assessment & Plan Note (Signed)
BP generally well controlled, however slightly elevated today. Will continue to monitor at facility. Will continue current medications. Will check renal function with labs today. Follow up 3 months and prn.

## 2013-01-13 NOTE — Assessment & Plan Note (Signed)
Will check lipids with labs today. Continue Atorvastatin. 

## 2013-01-14 LAB — COMPREHENSIVE METABOLIC PANEL
ALT: 20 U/L (ref 0–35)
AST: 22 U/L (ref 0–37)
Calcium: 9.3 mg/dL (ref 8.4–10.5)
Chloride: 105 mEq/L (ref 96–112)
Creatinine, Ser: 0.9 mg/dL (ref 0.4–1.2)

## 2013-01-14 LAB — CBC WITH DIFFERENTIAL/PLATELET
Basophils Absolute: 0 10*3/uL (ref 0.0–0.1)
Eosinophils Absolute: 0.2 10*3/uL (ref 0.0–0.7)
Lymphocytes Relative: 21.7 % (ref 12.0–46.0)
MCHC: 33.5 g/dL (ref 30.0–36.0)
MCV: 90.3 fl (ref 78.0–100.0)
Monocytes Absolute: 0.6 10*3/uL (ref 0.1–1.0)
Neutrophils Relative %: 70.2 % (ref 43.0–77.0)
Platelets: 258 10*3/uL (ref 150.0–400.0)
RDW: 13.4 % (ref 11.5–14.6)

## 2013-01-14 LAB — LIPID PANEL
HDL: 42.4 mg/dL (ref >39.00)
Total CHOL/HDL Ratio: 3
Triglycerides: 203 mg/dL — ABNORMAL HIGH (ref 0.0–149.0)
VLDL: 40.6 mg/dL — ABNORMAL HIGH (ref 0.0–40.0)

## 2013-01-14 LAB — TSH: TSH: 1.19 u[IU]/mL (ref 0.35–5.50)

## 2013-01-14 LAB — LDL CHOLESTEROL, DIRECT: Direct LDL: 64.5 mg/dL

## 2013-01-18 ENCOUNTER — Telehealth: Payer: Self-pay | Admitting: Internal Medicine

## 2013-01-18 NOTE — Telephone Encounter (Signed)
Patient returning your call.

## 2013-01-18 NOTE — Telephone Encounter (Signed)
Patient notified of lab results as instructed. Patient states that someone was suppose to get her an appointment with the foot doctor and she has not heard anything about that.

## 2013-02-09 ENCOUNTER — Telehealth: Payer: Self-pay | Admitting: Internal Medicine

## 2013-02-09 MED ORDER — GLUCOSE BLOOD VI STRP: ORAL_STRIP | Status: DC

## 2013-02-09 NOTE — Telephone Encounter (Signed)
Pt is needing refill on her Blood Sugar strips for her meter. She uses [facility] on [address]. Pt has about 10 strips left.

## 2013-02-09 NOTE — Telephone Encounter (Signed)
Rx sent to [facility] pharmacy on file.

## 2013-02-22 ENCOUNTER — Telehealth: Payer: Self-pay | Admitting: Internal Medicine

## 2013-02-22 NOTE — Telephone Encounter (Signed)
Pt was prescribed Contour but insurance has changed and [facility] tells pt she needs to change to Accu-Chek meter.  Pt is not sure what supplies she will need with the new meter now but needs to change to Accu-Chek so that her insurance will cover.  Pt needs supplies.

## 2013-02-26 MED ORDER — BLOOD GLUCOSE METER KIT: PACK | Status: DC

## 2013-02-26 MED ORDER — GLUCOSE BLOOD VI STRP: ORAL_STRIP | Status: DC

## 2013-02-26 NOTE — Telephone Encounter (Signed)
Rx has been faxed to [facility].

## 2013-03-04 ENCOUNTER — Telehealth: Payer: Self-pay | Admitting: Internal Medicine

## 2013-03-04 MED ORDER — GLUCOSE BLOOD VI STRP: ORAL_STRIP | Status: DC

## 2013-03-04 MED ORDER — LANCETS ULTRA THIN MISC: Status: DC

## 2013-03-04 MED ORDER — ATORVASTATIN CALCIUM 40 MG PO TABS: 40.0000 mg | ORAL_TABLET | Freq: Every day | ORAL | Status: DC

## 2013-03-04 MED ORDER — GLIPIZIDE 5 MG PO TABS: 5.0000 mg | ORAL_TABLET | Freq: Every day | ORAL | Status: DC

## 2013-03-04 MED ORDER — AMLODIPINE BESYLATE 5 MG PO TABS: 10.0000 mg | ORAL_TABLET | Freq: Every day | ORAL | Status: DC

## 2013-03-04 NOTE — Telephone Encounter (Signed)
All Diabetic supplies new Rx needed for [facility]  Ph: [phone].  Fx: [phone].  Pt says she is completely out.  States pharmacy told her they have not been able to get in touch with [place] regarding the supplies.  Pt states meds were recently changed because of her insurance and it has caused confusion.  Pt asking for a return call regarding this because she is completely out of supplies.

## 2013-03-04 NOTE — Telephone Encounter (Signed)
Left message to call back  

## 2013-03-04 NOTE — Telephone Encounter (Signed)
Spoke to patient and she needs her medication sent to [facility]

## 2013-03-04 NOTE — Telephone Encounter (Signed)
Patient returning your call.

## 2013-03-04 NOTE — Telephone Encounter (Signed)
Faxed to [facility]

## 2013-03-14 ENCOUNTER — Other Ambulatory Visit: Payer: Self-pay | Admitting: Internal Medicine

## 2013-03-15 ENCOUNTER — Telehealth: Payer: Self-pay | Admitting: Internal Medicine

## 2013-03-15 NOTE — Telephone Encounter (Signed)
Left message to call back  

## 2013-03-15 NOTE — Telephone Encounter (Signed)
Please call patient when this is done.

## 2013-03-15 NOTE — Telephone Encounter (Signed)
Left message for patient to call the office back, we have sent this prescription several times to different pharmacies and now we are receiving several request from different pharmacies. Patient or son needs to call back to clarify this information.

## 2013-03-15 NOTE — Telephone Encounter (Signed)
Refill request  *C/s accu-chek smartview*  Use as directed   *T/s accu-chek smartview strip*  Test once a day

## 2013-03-15 NOTE — Telephone Encounter (Signed)
Pharmacy Note:  Patient has a test strips, she needs an Rx for a test meter. She wants accu-chek We need exact directions and a dx code

## 2013-03-15 NOTE — Telephone Encounter (Signed)
Pt is calling concerning her meds. Pt has been out of her Diabetic medication for about a week and is needing to speak with a nurse.

## 2013-03-16 ENCOUNTER — Telehealth: Payer: Self-pay | Admitting: *Deleted

## 2013-03-16 MED ORDER — BLOOD GLUCOSE METER KIT: PACK | Status: DC

## 2013-03-16 NOTE — Telephone Encounter (Signed)
Please refer to previous encounter for further information. Rx for contour glucometer faxed to [facility] per patient request.

## 2013-03-16 NOTE — Telephone Encounter (Signed)
Pharmacy Note:  Contour blood glucose meter  Need new Rx written for meter with directions and diagnoses code

## 2013-03-16 NOTE — Telephone Encounter (Signed)
Rx has been sent to pharmacy, spoke with patient and all she need is a Contour glucometer. Rx for Contour glucometer faxed to [facility] and I called left a message for them to disregard fax for Accu-check meter.

## 2013-03-26 ENCOUNTER — Ambulatory Visit: Payer: Medicare Other | Admitting: Adult Health

## 2013-03-29 ENCOUNTER — Ambulatory Visit: Payer: Medicare Other | Admitting: Adult Health

## 2013-03-30 ENCOUNTER — Encounter: Payer: Self-pay | Admitting: Adult Health

## 2013-03-30 ENCOUNTER — Ambulatory Visit (INDEPENDENT_AMBULATORY_CARE_PROVIDER_SITE_OTHER): Payer: Medicare Other | Admitting: Adult Health

## 2013-03-30 ENCOUNTER — Ambulatory Visit: Payer: Medicare Other | Admitting: Adult Health

## 2013-03-30 VITALS — BP 130/60 | HR 90 | Temp 97.6°F | Resp 16 | Wt 194.5 lb

## 2013-03-30 DIAGNOSIS — R300 Dysuria: Secondary | ICD-10-CM

## 2013-03-30 LAB — POCT URINALYSIS DIPSTICK
Bilirubin, UA: NEGATIVE
Blood, UA: NEGATIVE
Glucose, UA: NEGATIVE
Ketones, UA: NEGATIVE
Nitrite, UA: NEGATIVE
Spec Grav, UA: <=1.005
pH, UA: 5.5

## 2013-03-30 MED ORDER — CIPROFLOXACIN HCL 250 MG PO TABS: 250.0000 mg | ORAL_TABLET | Freq: Two times a day (BID) | ORAL | Status: DC

## 2013-03-30 NOTE — Assessment & Plan Note (Signed)
UA dipstick shows small amount of leukocytes. Patient with uncomfortable symptoms of frequency, urgency and burning. Start Cipro x3 days. RTC if symptoms are not improved by end of treatment.

## 2013-03-30 NOTE — Progress Notes (Signed)
  Subjective:    Patient ID: Alexandra English, female    DOB: 01/22/1927, 79 y.o.   MRN: [id number]  HPI Patient is a pleasant 79 y/o female with a history of diabetes, CVA, hypertension, hyperlipidemia who presents to clinic with dysuria, frequency and urgency. She has been having symptoms for > greater than 2 weeks but could not get transportation here from [facility].  She denies fever, chills.   Review of Systems  Constitutional: Negative for fever and chills.  Genitourinary: Positive for dysuria, urgency and frequency. Negative for hematuria.   BP 130/60  Pulse 90  Temp(Src) 97.6 F (36.4 C) (Oral)  Resp 16  Wt 194 lb 8 oz (88.225 kg)  BMI 33.37 kg/m2  SpO2 96%     Objective:   Physical Exam  Constitutional: She is oriented to person, place, and time. She appears well-developed and well-nourished. No distress.  Cardiovascular: Normal rate and regular rhythm.   Pulmonary/Chest: Effort normal.  Neurological: She is alert and oriented to person, place, and time.  Skin: Skin is warm and dry.  Psychiatric: She has a normal mood and affect. Her behavior is normal. Judgment and thought content normal.          Assessment & Plan:

## 2013-03-30 NOTE — Patient Instructions (Addendum)
  Please start Cipro 250 mg twice daily for 3 days.  If your symptoms are not improved by the end of your treatment, please call.   What is the urinary tract? - The urinary tract is the group of organs in the body that handle urine. The urinary tract includes the:   Kidneys, two bean-shaped organs that filter the blood to make urine  Bladder, a balloon-shaped organ that stores urine  Ureters, two tubes that carry urine from the kidneys to the bladder  Urethra, the tube that carries urine from the bladder to the outside of the body   What are urinary tract infections? - Urinary tract infections, also called "UTIs," are infections that affect either the bladder or the kidneys. Bladder infections are more common than kidney infections. Bladder infections happen when bacteria get into the urethra and travel up into the bladder. Kidney infections happen when the bacteria travel even higher, up into the kidneys. Both bladder and kidney infections are more common in women than men.   What are the symptoms of a bladder infection? - The symptoms include:   Pain or a burning feeling when you urinate  The need to urinate often  The need to urinate suddenly or in a hurry  Blood in the urine   What are the symptoms of a kidney infection? - The symptoms of a kidney infection can include the symptoms of a bladder infection, but kidney infections can also cause:   Fever  Back pain  Nausea or vomiting   How are urinary tract infections treated? - Most urinary tract infections are treated with antibiotic pills. These pills work by killing the germs that cause the infection.  If you have a bladder infection, you will probably need to take antibiotics for 3 to 7 days. If you have a kidney infection, you will probably need to take antibiotics for longer-maybe for up to 2 weeks. If you have a kidney infection, it's also possible you will need to be treated in the hospital.   Your symptoms should begin to  improve within a day of starting antibiotics. But you should finish all the antibiotic pills you get. Otherwise your infection might come back.  If needed, you can also take a medicine to numb your bladder such as AZO or Urostat which are sold over the counter.   Can cranberry juice or other cranberry products prevent bladder infections? - The studies suggesting that cranberry products prevent bladder infections are not very good. But if you want to try cranberry products for this purpose, there is probably not much harm in doing so.

## 2013-04-01 LAB — URINE CULTURE

## 2013-04-19 ENCOUNTER — Telehealth: Payer: Self-pay | Admitting: Internal Medicine

## 2013-04-19 NOTE — Telephone Encounter (Signed)
Patient needing a renewal for Handicap Placard. She is wanting to pick it up tomorrow.

## 2013-04-20 NOTE — Telephone Encounter (Signed)
Form has been completed, up front for pick up. Left message on patient phone letting her know it was ready.

## 2013-04-20 NOTE — Telephone Encounter (Signed)
Information on ledge.

## 2013-04-26 ENCOUNTER — Other Ambulatory Visit: Payer: Self-pay | Admitting: *Deleted

## 2013-04-26 MED ORDER — LEVOTHYROXINE SODIUM 75 MCG PO TABS: 75.0000 ug | ORAL_TABLET | Freq: Every day | ORAL | Status: DC

## 2013-04-27 ENCOUNTER — Encounter: Payer: Self-pay | Admitting: Adult Health

## 2013-04-27 ENCOUNTER — Telehealth: Payer: Self-pay | Admitting: Internal Medicine

## 2013-04-27 ENCOUNTER — Ambulatory Visit (INDEPENDENT_AMBULATORY_CARE_PROVIDER_SITE_OTHER): Payer: Medicare Other | Admitting: Adult Health

## 2013-04-27 VITALS — BP 140/62 | HR 84 | Temp 97.9°F | Resp 12 | Wt 195.0 lb

## 2013-04-27 DIAGNOSIS — M79675 Pain in left toe(s): Secondary | ICD-10-CM | POA: Insufficient documentation

## 2013-04-27 DIAGNOSIS — R300 Dysuria: Secondary | ICD-10-CM

## 2013-04-27 DIAGNOSIS — M79609 Pain in unspecified limb: Secondary | ICD-10-CM

## 2013-04-27 LAB — POCT URINALYSIS DIPSTICK
Bilirubin, UA: NEGATIVE
Glucose, UA: 100
Ketones, UA: NEGATIVE
Nitrite, UA: NEGATIVE
pH, UA: 6

## 2013-04-27 MED ORDER — LEVOFLOXACIN 750 MG PO TABS: 750.0000 mg | ORAL_TABLET | Freq: Every day | ORAL | Status: DC

## 2013-04-27 NOTE — Telephone Encounter (Signed)
Would like to know if it would be ok to change brands, change to [employer]

## 2013-04-27 NOTE — Progress Notes (Signed)
  Subjective:    Patient ID: Alexandra English, female    DOB: 09-26-1927, 79 y.o.   MRN: [id number]  HPI  Patient presents to clinic with urgency, frequency and dysuria. Increased symptoms of urinary incontinence.  Patient also reports "I have gout in my left toe". She reports painful left great toe with redness and swelling. Denies fever, chills.   Review of Systems  Constitutional: Negative for fever and chills.  Genitourinary: Positive for dysuria, urgency and frequency. Negative for hematuria.  Musculoskeletal:       Pain in left great toe       Objective:   Physical Exam  Constitutional: She is oriented to person, place, and time. She appears well-developed and well-nourished. No distress.  Cardiovascular: Normal rate and regular rhythm.   Pulmonary/Chest: Effort normal and breath sounds normal.  Abdominal: Soft. There is no tenderness.  Genitourinary:  No suprapubic tenderness  Musculoskeletal: She exhibits edema.  Neurological: She is alert and oriented to person, place, and time.  Psychiatric: She has a normal mood and affect. Her behavior is normal. Judgment and thought content normal.          Assessment & Plan:

## 2013-04-27 NOTE — Assessment & Plan Note (Signed)
Suspect unresolved UTI. Treat with Levaquin 750 mg daily x 5 days. AZO for urinary discomfort. Patient with no history of renal insufficiency. Last creatinine level norma.

## 2013-04-27 NOTE — Assessment & Plan Note (Signed)
Suspect gout. Check uric acid levels.

## 2013-04-27 NOTE — Patient Instructions (Addendum)
  Start Levaquin 750 mg daily for 5 days.  Also try AZO which is a product sold over the counter to help with your urinary symptoms. This medication will turn your urine orange.

## 2013-04-28 LAB — URIC ACID: Uric Acid, Serum: 5.5 mg/dL (ref 2.4–7.0)

## 2013-04-28 NOTE — Telephone Encounter (Signed)
Patient has an appointment scheduled with you in June.

## 2013-04-28 NOTE — Telephone Encounter (Signed)
Yes, fine. Will need repeat TSH in 1 month.

## 2013-04-29 ENCOUNTER — Telehealth: Payer: Self-pay | Admitting: *Deleted

## 2013-04-29 LAB — URINE CULTURE

## 2013-04-29 NOTE — Telephone Encounter (Signed)
Already spoke with pharmacy in reference to this today.

## 2013-04-29 NOTE — Telephone Encounter (Signed)
Pharmacy Note:  Levothyroxin 75 mcg tab  Mfg change, Can we swith to [employer] brand?

## 2013-04-30 ENCOUNTER — Telehealth: Payer: Self-pay | Admitting: *Deleted

## 2013-04-30 MED ORDER — CEPHALEXIN 500 MG PO CAPS: 500.0000 mg | ORAL_CAPSULE | Freq: Three times a day (TID) | ORAL | Status: DC

## 2013-04-30 NOTE — Telephone Encounter (Signed)
Rx sent to pharmacy on file.

## 2013-04-30 NOTE — Telephone Encounter (Signed)
Message copied by Theola Sequin on Fri Apr 30, 2013  3:28 PM ------      Message from: Ronna Polio A      Created: Thu Apr 29, 2013  5:09 PM       Please call in Keflex for pt if not already called in.      ----- Message -----         From: Theola Sequin, CMA         Sent: 04/29/2013   4:52 PM           To: Wynona Dove, MD            Patient informed and verbally agreed. She allergic to Penicillin.       ------

## 2013-05-03 ENCOUNTER — Telehealth: Payer: Self-pay | Admitting: *Deleted

## 2013-05-03 ENCOUNTER — Telehealth: Payer: Self-pay | Admitting: Internal Medicine

## 2013-05-03 MED ORDER — SULFAMETHOXAZOLE-TRIMETHOPRIM 800-160 MG PO TABS: 1.0000 | ORAL_TABLET | Freq: Two times a day (BID) | ORAL | Status: DC

## 2013-05-03 NOTE — Telephone Encounter (Signed)
Patient called and stated the abx that was called in has Penicillin in it and she is allergic.

## 2013-05-03 NOTE — Telephone Encounter (Signed)
Duplicate message, please refer to previous encounter for further information

## 2013-05-03 NOTE — Telephone Encounter (Signed)
Bactrim DS tab po bid x 7 days. #14

## 2013-05-03 NOTE — Addendum Note (Signed)
Addended by: Theola Sequin on: 05/03/2013 05:04 PM   Modules accepted: Orders

## 2013-05-03 NOTE — Telephone Encounter (Signed)
Patient called stated her medication is not at the pharmacy for her UTI.  Patient wanting it today.

## 2013-05-03 NOTE — Telephone Encounter (Signed)
Rx sent to the pharmacy and the patient is aware

## 2013-05-03 NOTE — Telephone Encounter (Signed)
Keflex does not have penicillin in it, however about 10% of people allergic to penicillin will be allergic to Keflex. Does she know that she is allergic? If yes, what antibiotics can she tolerate?

## 2013-05-03 NOTE — Telephone Encounter (Signed)
She is allergic to penicillin and she can tolerate most sulfa drugs ok.

## 2013-06-02 ENCOUNTER — Ambulatory Visit: Payer: Medicare Other | Admitting: Internal Medicine

## 2013-06-04 ENCOUNTER — Encounter: Payer: Self-pay | Admitting: Internal Medicine

## 2013-06-04 ENCOUNTER — Ambulatory Visit (INDEPENDENT_AMBULATORY_CARE_PROVIDER_SITE_OTHER): Payer: Medicare Other | Admitting: Internal Medicine

## 2013-06-04 VITALS — BP 132/72 | HR 83 | Temp 98.1°F | Wt 196.0 lb

## 2013-06-04 DIAGNOSIS — F341 Dysthymic disorder: Secondary | ICD-10-CM

## 2013-06-04 DIAGNOSIS — R609 Edema, unspecified: Secondary | ICD-10-CM

## 2013-06-04 DIAGNOSIS — E785 Hyperlipidemia, unspecified: Secondary | ICD-10-CM

## 2013-06-04 DIAGNOSIS — R300 Dysuria: Secondary | ICD-10-CM

## 2013-06-04 DIAGNOSIS — I10 Essential (primary) hypertension: Secondary | ICD-10-CM

## 2013-06-04 DIAGNOSIS — E118 Type 2 diabetes mellitus with unspecified complications: Secondary | ICD-10-CM

## 2013-06-04 DIAGNOSIS — R269 Unspecified abnormalities of gait and mobility: Secondary | ICD-10-CM

## 2013-06-04 DIAGNOSIS — L84 Corns and callosities: Secondary | ICD-10-CM

## 2013-06-04 DIAGNOSIS — F419 Anxiety disorder, unspecified: Secondary | ICD-10-CM

## 2013-06-04 DIAGNOSIS — R2681 Unsteadiness on feet: Secondary | ICD-10-CM

## 2013-06-04 LAB — POCT URINALYSIS DIPSTICK
Bilirubin, UA: NEGATIVE
Glucose, UA: NEGATIVE
Ketones, UA: NEGATIVE
Nitrite, UA: NEGATIVE
Protein, UA: NEGATIVE
Spec Grav, UA: <=1.005
Urobilinogen, UA: 0.2
pH, UA: 5.5

## 2013-06-04 LAB — COMPREHENSIVE METABOLIC PANEL
ALT: 18 U/L (ref 0–35)
Alkaline Phosphatase: 127 U/L — ABNORMAL HIGH (ref 39–117)
CO2: 21 mEq/L (ref 19–32)
Sodium: 140 mEq/L (ref 135–145)
Total Bilirubin: 0.4 mg/dL (ref 0.3–1.2)
Total Protein: 7.8 g/dL (ref 6.0–8.3)

## 2013-06-04 MED ORDER — HYDROXYZINE HCL 25 MG PO TABS: 25.0000 mg | ORAL_TABLET | Freq: Every day | ORAL | Status: DC | PRN

## 2013-06-04 MED ORDER — SULFAMETHOXAZOLE-TRIMETHOPRIM 800-160 MG PO TABS: 1.0000 | ORAL_TABLET | Freq: Two times a day (BID) | ORAL | Status: DC

## 2013-06-04 MED ORDER — HYDROCHLOROTHIAZIDE 12.5 MG PO CAPS: 12.5000 mg | ORAL_CAPSULE | Freq: Every day | ORAL | Status: DC

## 2013-06-04 NOTE — Assessment & Plan Note (Signed)
Secondary to chronic venous insufficiency. Discussed using compression stockings and keeping legs elevated. Will also start HCTZ 12.5mg  daily for her to try to see if any reduction in swelling.

## 2013-06-04 NOTE — Progress Notes (Signed)
Subjective:    Patient ID: Alexandra English, female    DOB: 1927/02/08, 79 y.o.   MRN: [id number]  HPI 79 year old female with history of stroke, hypertension, diabetes, hypothyroidism presents for followup. She has been lost to followup for 6 months. She has difficulty getting transportation to her appointments. She is tearful today describing alienation from her son. She has not seen him since December 2013. She reports that since she moved into assisted living facility he has had very little contact with her. She has not discussed this with him  In regards to diabetes, she did not bring record of her blood sugars. She does report compliance with glipizide.  She is concerned about swelling in her lower legs. She reports swelling in her lower legs over the last several months. This improves with keeping her legs elevated. She also notes some calluses on her feet and has had difficulty cutting her toenails. She denies any shortness of breath, cough, chest pain. She has tried using compression stockings in the past but finds these difficult to get on.  She is interested in getting a motorized scooter. She has talked to a representative from her insurance who reported that this is a covered benefit. She would like to be more mobile at her assisted-living facility. She finds it difficult to get around using her Alexandra English because of chronic weakness in her legs. She does not feel that she has the strength to move a regular wheelchair with her arms.  She is also concerned today about several days of dysuria, urinary urgency, urinary frequency. She denies any fever, chills, flank pain. She is not taking any medication for this.  Outpatient Encounter Prescriptions as of 06/04/2013  Medication Sig Dispense Refill  . amLODipine (NORVASC) 5 MG tablet Take 2 tablets (10 mg total) by mouth daily.  90 tablet  1  . aspirin 81 MG tablet Take 81 mg by mouth daily.        Marland Kitchen atorvastatin (LIPITOR) 40 MG tablet Take 1  tablet (40 mg total) by mouth daily.  90 tablet  1  . BAYER CONTOUR TEST test strip USE TO TEST BLOOD SUGAR 1-2 TIMES A DAY  100 each  0  . Blood Glucose Monitoring Suppl (BLOOD GLUCOSE METER) kit Please dispense to patient an Contour Home Glucometer to test daily. Patient will be testing at home 1-2 times daily. Dx Code 250.00  1 each  0  . cloNIDine (CATAPRES) 0.2 MG tablet Take 1 tablet (0.2 mg total) by mouth at bedtime.  90 tablet  4  . glipiZIDE (GLUCOTROL) 5 MG tablet Take 1 tablet (5 mg total) by mouth daily.  90 tablet  1  . hydrOXYzine (ATARAX/VISTARIL) 25 MG tablet Take 1 tablet (25 mg total) by mouth daily as needed.  90 tablet  3  . LANCETS ULTRA THIN MISC Please dispense 1 box of lancets for patient to use at home to test blood sugars 1-2 times a day.  Dx code 250.00  100 each  1  . levothyroxine (SYNTHROID, LEVOTHROID) 75 MCG tablet Take 1 tablet (75 mcg total) by mouth daily.  90 tablet  4  . mirtazapine (REMERON) 15 MG tablet Take 1 tablet (15 mg total) by mouth at bedtime.  90 tablet  4  . omeprazole (PRILOSEC) 20 MG capsule Take 1 capsule (20 mg total) by mouth daily.  90 capsule  4  . traMADol (ULTRAM) 50 MG tablet Take 1 tablet (50 mg total) by mouth every 6 (six)  hours as needed.  180 tablet  4   No facility-administered encounter medications on file as of 06/04/2013.   BP 132/72  Pulse 83  Temp(Src) 98.1 F (36.7 C) (Oral)  Wt 196 lb (88.905 kg)  BMI 33.63 kg/m2  SpO2 95%  Review of Systems  Constitutional: Negative for fever, chills, appetite change, fatigue and unexpected weight change.  HENT: Negative for ear pain, congestion, sore throat, trouble swallowing, neck pain, voice change and sinus pressure.   Eyes: Negative for visual disturbance.  Respiratory: Negative for cough, shortness of breath, wheezing and stridor.   Cardiovascular: Positive for leg swelling. Negative for chest pain and palpitations.  Gastrointestinal: Negative for nausea, vomiting, abdominal  pain, diarrhea, constipation, blood in stool, abdominal distention and anal bleeding.  Genitourinary: Negative for dysuria and flank pain.  Musculoskeletal: Positive for gait problem. Negative for myalgias and arthralgias.  Skin: Negative for color change and rash.  Neurological: Positive for weakness. Negative for dizziness and headaches.  Hematological: Negative for adenopathy. Does not bruise/bleed easily.  Psychiatric/Behavioral: Positive for dysphoric mood. Negative for suicidal ideas and sleep disturbance. The patient is nervous/anxious.        Objective:   Physical Exam  Constitutional: She is oriented to person, place, and time. She appears well-developed and well-nourished. No distress.  HENT:  Head: Normocephalic and atraumatic.  Right Ear: External ear normal.  Left Ear: External ear normal.  Nose: Nose normal.  Mouth/Throat: Oropharynx is clear and moist. No oropharyngeal exudate.  Eyes: Conjunctivae are normal. Pupils are equal, round, and reactive to light. Right eye exhibits no discharge. Left eye exhibits no discharge. No scleral icterus.  Neck: Normal range of motion. Neck supple. No tracheal deviation present. No thyromegaly present.  Cardiovascular: Normal rate, regular rhythm, normal heart sounds and intact distal pulses.  Exam reveals no gallop and no friction rub.   No murmur heard. Pulmonary/Chest: Effort normal and breath sounds normal. No accessory muscle usage. Not tachypneic. No respiratory distress. She has no decreased breath sounds. She has no wheezes. She has no rhonchi. She has no rales. She exhibits no tenderness.  Musculoskeletal: Normal range of motion. She exhibits edema (2+ pitting to mid shin BLE). She exhibits no tenderness.  Lymphadenopathy:    She has no cervical adenopathy.  Neurological: She is alert and oriented to person, place, and time. No cranial nerve deficit. She exhibits normal muscle tone. Coordination normal.  Skin: Skin is warm and  dry. No rash noted. She is not diaphoretic. No erythema. No pallor.  Psychiatric: Her speech is normal and behavior is normal. Judgment and thought content normal. She exhibits a depressed mood.          Assessment & Plan:

## 2013-06-04 NOTE — Assessment & Plan Note (Signed)
Patient is not regularly check blood sugars. Will check A1c with labs. Continue glipizide.

## 2013-06-04 NOTE — Assessment & Plan Note (Signed)
BP Readings from Last 3 Encounters:  06/04/13 132/72  04/27/13 140/62  03/30/13 130/60   BP well controlled on current medications. Discussed adding HCTZ prn to help with lower extremity edema. Will check renal function with labs today.

## 2013-06-04 NOTE — Assessment & Plan Note (Signed)
Will check lipids and LFTs with labs today. Continue atorvastatin. 

## 2013-06-04 NOTE — Assessment & Plan Note (Signed)
Will set up podiatry evaluation for nail clipping and chronic callous removal.

## 2013-06-04 NOTE — Assessment & Plan Note (Signed)
Increased symptoms of depressed mood recently with separation from her son. Encouraged her to talk to him about this. Offered support today.

## 2013-06-04 NOTE — Assessment & Plan Note (Signed)
Patient would like to use a motorized scooter. Encouraged her to contact the scooter store to arrange assessment.

## 2013-06-04 NOTE — Assessment & Plan Note (Signed)
Symptoms and urinalysis consistent with UTI. Will send urine for culture. Will start Bactrim. Pt will call if symptoms are not improving.

## 2013-06-07 ENCOUNTER — Other Ambulatory Visit: Payer: Self-pay | Admitting: Internal Medicine

## 2013-06-08 LAB — HEMOGLOBIN A1C
Hgb A1c MFr Bld: 7.6 % — ABNORMAL HIGH (ref <5.7)
Mean Plasma Glucose: 171 mg/dL — ABNORMAL HIGH (ref <117)

## 2013-06-08 LAB — URINE CULTURE: Colony Count: >=100000

## 2013-06-09 ENCOUNTER — Telehealth: Payer: Self-pay | Admitting: *Deleted

## 2013-06-09 NOTE — Telephone Encounter (Signed)
Received fax from [employer], Hydroxyzine Hcl was APPROVED  Through 06.25.2015

## 2013-06-11 ENCOUNTER — Telehealth: Payer: Self-pay | Admitting: *Deleted

## 2013-06-11 NOTE — Telephone Encounter (Signed)
Keflex is not the same as penicillin. Can you confirm if she has tried this medication?

## 2013-06-11 NOTE — Telephone Encounter (Signed)
Message copied by Theola Sequin on Fri Jun 11, 2013 11:17 AM ------      Message from: Ronna Polio A      Created: Tue Jun 08, 2013 12:10 PM       Urine culture shows infection which was resistant to Bactrim. I would like to change to Keflex 500mg  po tid x 7 days. Can you confirm that she is not allergic to this? ------

## 2013-06-11 NOTE — Telephone Encounter (Signed)
Patient has taken all the Bactrim and she has an allergy to Penicillin.

## 2013-06-14 MED ORDER — CEPHALEXIN 500 MG PO CAPS: 500.0000 mg | ORAL_CAPSULE | Freq: Three times a day (TID) | ORAL | Status: DC

## 2013-06-14 NOTE — Telephone Encounter (Signed)
Left message to call back  

## 2013-06-14 NOTE — Telephone Encounter (Signed)
When I spoke with patient last week, she called me back and stated she called the pharmacy. The pharmacist told her that it was like Penicillin and she should not take it.

## 2013-06-14 NOTE — Telephone Encounter (Signed)
Spoke with patient, she stated she is not sure if she has ever taken this medication before and will go ahead and take it. Rx sent to pharmacy on file.

## 2013-06-14 NOTE — Telephone Encounter (Signed)
Keflex is not the same as PCN. Unless she has had a reaction to this medication in the past, I think it is our best option.

## 2013-06-15 ENCOUNTER — Telehealth: Payer: Self-pay | Admitting: Internal Medicine

## 2013-06-15 DIAGNOSIS — N390 Urinary tract infection, site not specified: Secondary | ICD-10-CM

## 2013-06-15 NOTE — Telephone Encounter (Signed)
Pt was prescribed Keflex.  [facility] says pt has allergy.  Please advise what other medication the pt can begin.

## 2013-06-15 NOTE — Telephone Encounter (Signed)
Left message to call back  

## 2013-06-15 NOTE — Addendum Note (Signed)
Addended by: Ronna Polio A on: 06/15/2013 04:26 PM   Modules accepted: Orders

## 2013-06-15 NOTE — Telephone Encounter (Signed)
We will need to set her up with Dr. Sampson Goon in Infectious Disease, as there is not another alternative medication that she can tolerate, that the infection is sensitive to.

## 2013-06-15 NOTE — Telephone Encounter (Signed)
Fwd to Dr. Walker 

## 2013-06-16 NOTE — Telephone Encounter (Signed)
Left message to call back  

## 2013-06-16 NOTE — Telephone Encounter (Signed)
Patient informed of why BS maybe running high and aware appointment has been made. According to notes, Alexandra English has left this information on son's voicemail.

## 2013-06-16 NOTE — Telephone Encounter (Signed)
She has an apt with Dr. Sampson Goon on 7/16- and has been placed on a call list incase someone cancels.

## 2013-06-16 NOTE — Telephone Encounter (Signed)
Her blood sugar is likely elevated because of her urinary tract infection. Amber - Can we try to get this appointment as soon as possible?

## 2013-06-16 NOTE — Telephone Encounter (Signed)
Spoke with patient, she is fine with the referral as long she is given a notice of when the appointment will be. Also she wanted to let you know that her BS has been running high, when she took it this morning it was 156 and last night it was 160.

## 2013-06-21 ENCOUNTER — Telehealth: Payer: Self-pay | Admitting: Internal Medicine

## 2013-06-21 NOTE — Telephone Encounter (Signed)
The visit with Dr. Sampson Goon is next week. She has had issues with UTI for several months. Unless she is having fever, chills, flank pain, I think this is reasonable. This is our best option.

## 2013-06-21 NOTE — Telephone Encounter (Signed)
Patient called stating she is a nervous wreck. She is upset about "all these medications" she has taken with nothing working. She is also upset that Dr. Sampson Alexandra English cannot see her until 7/16, she would like to talk to yall about this issue. Please advise

## 2013-06-22 NOTE — Telephone Encounter (Signed)
LMTCB

## 2013-06-22 NOTE — Telephone Encounter (Signed)
Patient informed and verbally agreed that will be fine.

## 2013-07-23 ENCOUNTER — Encounter: Payer: Self-pay | Admitting: Internal Medicine

## 2013-07-28 ENCOUNTER — Telehealth: Payer: Self-pay | Admitting: Internal Medicine

## 2013-07-28 NOTE — Telephone Encounter (Signed)
Pt calling, states on her paperwork under allergies amlodipine is listed and states as of 03/30/2013.  Pt states she has been taking this medication all this time.  Asking if this is a mistake, or is she allergic to amlodipine.  Also states she is having trouble with her blood sugars.  States it may be due to medication she is taking for bladder infection.  States she has had the bladder infection x2 months.  Pt states she does not feel good.  Pt states she cannot come in because she does not have a ride.  Asking for a call.

## 2013-07-29 NOTE — Telephone Encounter (Signed)
Patient informed and scheduled appointment for tomorrow at 415

## 2013-07-29 NOTE — Telephone Encounter (Signed)
Her BS has been running about 150 everyday and she is taking abx for a bladder infection. She has been staying in but she is nervous. Just concerned about her BS being high on the abx. Can not understand why amlodipine is on her allergy list when she is currently taking. It was an abstraction from 08/31/11

## 2013-07-29 NOTE — Telephone Encounter (Signed)
Let's bring her in for a visit to review these issues.

## 2013-07-30 ENCOUNTER — Ambulatory Visit (INDEPENDENT_AMBULATORY_CARE_PROVIDER_SITE_OTHER): Payer: Medicare Other | Admitting: Internal Medicine

## 2013-07-30 ENCOUNTER — Encounter: Payer: Self-pay | Admitting: Internal Medicine

## 2013-07-30 VITALS — BP 160/80 | HR 73 | Temp 97.8°F | Wt 193.0 lb

## 2013-07-30 DIAGNOSIS — I10 Essential (primary) hypertension: Secondary | ICD-10-CM

## 2013-07-30 DIAGNOSIS — N390 Urinary tract infection, site not specified: Secondary | ICD-10-CM

## 2013-07-30 DIAGNOSIS — E118 Type 2 diabetes mellitus with unspecified complications: Secondary | ICD-10-CM

## 2013-07-30 MED ORDER — AMLODIPINE BESYLATE 5 MG PO TABS: 5.0000 mg | ORAL_TABLET | Freq: Every day | ORAL | Status: DC

## 2013-07-30 MED ORDER — GLIPIZIDE 10 MG PO TABS: 10.0000 mg | ORAL_TABLET | Freq: Every day | ORAL | Status: DC

## 2013-07-30 NOTE — Progress Notes (Signed)
Subjective:    Patient ID: Alexandra English, female    DOB: 1927/03/20, 79 y.o.   MRN: [id number]  HPI 79 year old female with history of stroke, hypertension, hyperlipidemia, diabetes presents for acute visit. She has several concerns today.  First, she is concerned about recent treatment of urinary tract infections. She was seen by infectious disease specialist and started on Macrobid. She reports that ever since starting this medication she has felt anxious and jittery. She denies any fever, chills, flank pain. She continues to have intermittent mild dysuria. She has increased urinary frequency which is chronic.  Second, she is concerned about her blood sugars. She reports that ever since being diagnosed with urinary tract infection her fasting blood sugar has been closer to 150. She questions if she may need increase in her medication. She reports full compliance with glipizide.  Third, she notes that she was told by a pharmacist that she was allergic amlodipine. However, she reports she has been on this medication for years. She questions whether she should continue it.  Outpatient Encounter Prescriptions as of 07/30/2013  Medication Sig Dispense Refill  . amLODipine (NORVASC) 5 MG tablet Take 1 tablet (5 mg total) by mouth daily.  90 tablet  1  . aspirin 81 MG tablet Take 81 mg by mouth daily.        Marland Kitchen atorvastatin (LIPITOR) 40 MG tablet Take 1 tablet (40 mg total) by mouth daily.  90 tablet  1  . BAYER CONTOUR TEST test strip USE TO TEST BLOOD SUGAR 1-2 TIMES A DAY  100 each  0  . Blood Glucose Monitoring Suppl (BLOOD GLUCOSE METER) kit Please dispense to patient an Contour Home Glucometer to test daily. Patient will be testing at home 1-2 times daily. Dx Code 250.00  1 each  0  . cloNIDine (CATAPRES) 0.2 MG tablet Take 1 tablet (0.2 mg total) by mouth at bedtime.  90 tablet  4  . glipiZIDE (GLUCOTROL) 10 MG tablet Take 1 tablet (10 mg total) by mouth daily.  90 tablet  1  . LANCETS ULTRA  THIN MISC Please dispense 1 box of lancets for patient to use at home to test blood sugars 1-2 times a day.  Dx code 250.00  100 each  1  . levothyroxine (SYNTHROID, LEVOTHROID) 75 MCG tablet Take 1 tablet (75 mcg total) by mouth daily.  90 tablet  4  . mirtazapine (REMERON) 15 MG tablet Take 1 tablet (15 mg total) by mouth at bedtime.  90 tablet  4  . omeprazole (PRILOSEC) 20 MG capsule Take 1 capsule (20 mg total) by mouth daily.  90 capsule  4  . PREMARIN vaginal cream       . traMADol (ULTRAM) 50 MG tablet Take 1 tablet (50 mg total) by mouth every 6 (six) hours as needed.  180 tablet  4   No facility-administered encounter medications on file as of 07/30/2013.   BP 160/80  Pulse 73  Temp(Src) 97.8 F (36.6 C) (Oral)  Wt 193 lb (87.544 kg)  BMI 33.11 kg/m2  SpO2 96%  Review of Systems  Constitutional: Negative for fever, chills, appetite change, fatigue and unexpected weight change.  HENT: Negative for ear pain, congestion, sore throat, trouble swallowing, neck pain, voice change and sinus pressure.   Eyes: Negative for visual disturbance.  Respiratory: Negative for cough, shortness of breath, wheezing and stridor.   Cardiovascular: Negative for chest pain, palpitations and leg swelling.  Gastrointestinal: Negative for nausea, vomiting, abdominal pain,  diarrhea, constipation, blood in stool, abdominal distention and anal bleeding.  Genitourinary: Negative for dysuria and flank pain.  Musculoskeletal: Negative for myalgias, arthralgias and gait problem.  Skin: Negative for color change and rash.  Neurological: Negative for dizziness and headaches.  Hematological: Negative for adenopathy. Does not bruise/bleed easily.  Psychiatric/Behavioral: Positive for agitation (since starting Macrobid). Negative for suicidal ideas, sleep disturbance and dysphoric mood. The patient is not nervous/anxious.        Objective:   Physical Exam  Constitutional: She is oriented to person, place, and  time. She appears well-developed and well-nourished. No distress.  HENT:  Head: Normocephalic and atraumatic.  Right Ear: External ear normal.  Left Ear: External ear normal.  Nose: Nose normal.  Mouth/Throat: Oropharynx is clear and moist. No oropharyngeal exudate.  Eyes: Conjunctivae are normal. Pupils are equal, round, and reactive to light. Right eye exhibits no discharge. Left eye exhibits no discharge. No scleral icterus.  Neck: Normal range of motion. Neck supple. No tracheal deviation present. No thyromegaly present.  Cardiovascular: Normal rate, regular rhythm, normal heart sounds and intact distal pulses.  Exam reveals no gallop and no friction rub.   No murmur heard. Pulmonary/Chest: Effort normal and breath sounds normal. No accessory muscle usage. Not tachypneic. No respiratory distress. She has no decreased breath sounds. She has no wheezes. She has no rhonchi. She has no rales. She exhibits no tenderness.  Musculoskeletal: Normal range of motion. She exhibits no edema and no tenderness.  Lymphadenopathy:    She has no cervical adenopathy.  Neurological: She is alert and oriented to person, place, and time. No cranial nerve deficit. She exhibits normal muscle tone. Coordination normal.  Skin: Skin is warm and dry. No rash noted. She is not diaphoretic. No erythema. No pallor.  Psychiatric: She has a normal mood and affect. Her behavior is normal. Judgment and thought content normal.          Assessment & Plan:

## 2013-07-30 NOTE — Assessment & Plan Note (Addendum)
Recent blood sugars have been elevated. Will increase glipizide to 10 mg daily. Followup with A1c in one month. Pt will call sooner if any questions or concerns. She will call if BG persistently >250 or if any BG <70.

## 2013-07-30 NOTE — Assessment & Plan Note (Signed)
Multidrug resistant UTI, currently on Macrobid, but having increased anxiety and agitation on this medication. Will stop the medication. Will have her follow up with ID next week. Question if she might be candidate for Ertapenem IV for treatment.

## 2013-07-30 NOTE — Assessment & Plan Note (Signed)
BP Readings from Last 3 Encounters:  07/30/13 160/80  06/04/13 132/72  04/27/13 140/62   BP generally well controlled on current medications. Will continue amlodipine. No evidence that she has ever had allergy to this medication. Provided reassurance today.

## 2013-07-30 NOTE — Patient Instructions (Addendum)
Increase dose of Glipizde to 10mg  daily to help control blood sugars.  We will call Dr. Sampson Goon and try to schedule follow up next week, given ongoing bladder infection. Stop Macrobid.  Follow up here next month or sooner as needed.

## 2013-08-02 ENCOUNTER — Telehealth: Payer: Self-pay | Admitting: Internal Medicine

## 2013-08-02 DIAGNOSIS — N390 Urinary tract infection, site not specified: Secondary | ICD-10-CM

## 2013-08-02 NOTE — Telephone Encounter (Signed)
Fwd to Dr. Walker 

## 2013-08-02 NOTE — Telephone Encounter (Signed)
That is fine. I assume for urinary tract infections. We can set this up.

## 2013-08-02 NOTE — Telephone Encounter (Signed)
Pt wants to know if she could go see Dr. Achilles Dunk for her Kidney's

## 2013-08-03 NOTE — Telephone Encounter (Signed)
Does not want to see Dr. Achilles Dunk now since she has the appointment with Dr. Sampson Goon.

## 2013-08-03 NOTE — Telephone Encounter (Signed)
Spoke with patient, she stated she has an appointment on Friday at 315 with Dr. Sampson Goon.

## 2013-08-03 NOTE — Telephone Encounter (Signed)
Returned patient call, no answer or voicemail to leave a message.

## 2013-08-31 ENCOUNTER — Encounter: Payer: Self-pay | Admitting: Emergency Medicine

## 2013-08-31 ENCOUNTER — Telehealth: Payer: Self-pay | Admitting: Internal Medicine

## 2013-08-31 NOTE — Telephone Encounter (Signed)
Tracey from Dr. Wynn Maudlin office called to let me know they have left messages for the pt and the pts son to return their call to s/u Ms. Grego with Urology. However, this is week two that they have not had a returned call. Please advise

## 2013-08-31 NOTE — Telephone Encounter (Signed)
Letter mailed

## 2013-08-31 NOTE — Telephone Encounter (Signed)
We should send her a letter or call her to ask her to follow up with them.

## 2013-09-14 ENCOUNTER — Other Ambulatory Visit: Payer: Self-pay | Admitting: Internal Medicine

## 2013-09-15 NOTE — Telephone Encounter (Signed)
Eprescribed.

## 2013-09-24 ENCOUNTER — Encounter: Payer: Self-pay | Admitting: *Deleted

## 2013-09-28 ENCOUNTER — Ambulatory Visit: Payer: Self-pay | Admitting: Podiatry

## 2013-10-05 ENCOUNTER — Encounter: Payer: Self-pay | Admitting: Podiatry

## 2013-10-05 ENCOUNTER — Ambulatory Visit (INDEPENDENT_AMBULATORY_CARE_PROVIDER_SITE_OTHER): Payer: Medicare Other | Admitting: Podiatry

## 2013-10-05 VITALS — BP 132/65 | HR 61 | Resp 12 | Ht 65.0 in | Wt 190.0 lb

## 2013-10-05 DIAGNOSIS — B351 Tinea unguium: Secondary | ICD-10-CM

## 2013-10-05 DIAGNOSIS — Q828 Other specified congenital malformations of skin: Secondary | ICD-10-CM

## 2013-10-05 DIAGNOSIS — M79609 Pain in unspecified limb: Secondary | ICD-10-CM

## 2013-10-05 DIAGNOSIS — E1159 Type 2 diabetes mellitus with other circulatory complications: Secondary | ICD-10-CM

## 2013-10-05 NOTE — Progress Notes (Signed)
Subjective:     Patient ID: Alexandra English, female   DOB: 03-24-1927, 79 y.o.   MRN: [id number]  HPI patient presents stating I need my nails cut they're painful and he wanted it and this lesion on the bottom my right foot is so sore. Patient states that she has diabetes and she does get some burning in her feet and her circulation is not great and she cannot take care of these herself   Review of Systems  All other systems reviewed and are negative.       Objective:   Physical Exam  Constitutional: She appears well-developed.  Musculoskeletal: Normal range of motion.  Neurological: She is alert.  Skin: Skin is dry.   Circulatory status moderately diminished PT and DP pulses and I noted diminishment of reflexes and vibratory sensation. Patient is found to have keratotic lesion with lucent and core plantar aspect fifth metatarsal right and nail disease 1-5 bilateral with thick subungual debris    Assessment:     *Diabetic with porokeratotic lesion sub-5 right and painful nail disease 1-5 bilateral    Plan:     Debridement of lesion plantar right porokeratotic in nature no iatrogenic bleeding noted and debridement of nailbeds 1-5 bilateral no iatrogenic bleeding

## 2013-12-23 ENCOUNTER — Telehealth: Payer: Self-pay | Admitting: *Deleted

## 2013-12-23 NOTE — Telephone Encounter (Signed)
Refill request  Contour blood glucose test  #100  Use to test blood sugar once to twice daily

## 2013-12-24 MED ORDER — GLUCOSE BLOOD VI STRP: ORAL_STRIP | Status: DC

## 2013-12-24 NOTE — Telephone Encounter (Signed)
Prescription sent to pharmacy on file. 

## 2013-12-30 ENCOUNTER — Telehealth: Payer: Self-pay | Admitting: *Deleted

## 2013-12-30 DIAGNOSIS — E118 Type 2 diabetes mellitus with unspecified complications: Secondary | ICD-10-CM

## 2013-12-30 NOTE — Telephone Encounter (Signed)
Pharmacy Note:  Insurance does not cover contour testing supplies-can we get a new rx for meter,strips and lancets (accu-chek-aviva) with directions, qty, and dx code

## 2013-12-31 MED ORDER — ACCU-CHEK AVIVA PLUS W/DEVICE KIT: PACK | Status: DC

## 2013-12-31 MED ORDER — ACCU-CHEK SOFTCLIX LANCET DEV MISC: Status: DC

## 2013-12-31 MED ORDER — GLUCOSE BLOOD VI STRP: ORAL_STRIP | Status: DC

## 2013-12-31 NOTE — Telephone Encounter (Signed)
Prescriptions sent to the pharmacy.  

## 2014-01-04 ENCOUNTER — Ambulatory Visit: Payer: Medicare Other | Admitting: Podiatry

## 2014-01-20 ENCOUNTER — Telehealth: Payer: Self-pay | Admitting: Internal Medicine

## 2014-01-20 ENCOUNTER — Other Ambulatory Visit: Payer: Self-pay | Admitting: Internal Medicine

## 2014-01-20 NOTE — Telephone Encounter (Signed)
Pt states she has not been seen in a while and needs a check up.  Next 30 min slot 3/27.  Pt asking if that is okay, or if Dr. Dan HumphreysWalker thinks she should be seen sooner.  Pt states she is doing okay but needs to see Dr. Dan HumphreysWalker.  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

## 2014-01-21 NOTE — Telephone Encounter (Signed)
We can put her in next slot if having anxiety issues.

## 2014-01-21 NOTE — Telephone Encounter (Signed)
Please call patient to schedule.

## 2014-01-21 NOTE — Telephone Encounter (Signed)
Please advise 

## 2014-01-28 ENCOUNTER — Ambulatory Visit: Payer: Medicare Other | Admitting: Podiatry

## 2014-02-09 ENCOUNTER — Telehealth: Payer: Self-pay | Admitting: Internal Medicine

## 2014-02-09 NOTE — Telephone Encounter (Signed)
Pt's son wanting her to ask if there is any way her insurance will help pay for pads.  States she has a bladder infection all the time and every month she has to buy the pads and it costs a lot.

## 2014-02-11 NOTE — Telephone Encounter (Signed)
I am not sure if her insurance will help cover the pads. She will have to ask her insurance carrier.

## 2014-02-11 NOTE — Telephone Encounter (Signed)
Left detailed message on son's Gery PrayBarry voicemail informing him to [employer] insurance company.

## 2014-02-11 NOTE — Telephone Encounter (Signed)
Will insurance pay for pads if she has a prescription?

## 2014-02-11 NOTE — Telephone Encounter (Signed)
Left message to call back  

## 2014-02-17 ENCOUNTER — Telehealth: Payer: Self-pay | Admitting: Internal Medicine

## 2014-02-17 NOTE — Telephone Encounter (Signed)
Pt called to check status of prescription for pads.  Advised per last ph note Gery PrayBarry was to contact insurance.  Pt states she has not heard from her son about this.

## 2014-02-18 ENCOUNTER — Telehealth: Payer: Self-pay | Admitting: Internal Medicine

## 2014-02-18 MED ORDER — INCONTINENCE BRIEF LARGE MISC: Status: DC

## 2014-02-18 NOTE — Telephone Encounter (Signed)
Pt states [facility] would not [facility] the Depends Rx, even though they have previously. States she was not told why. [facility].

## 2014-02-18 NOTE — Telephone Encounter (Signed)
Advised Pt to contact her insurance company to see if they will cover her Depends, if so then call us back with the information and we would call in the Rx

## 2014-02-18 NOTE — Telephone Encounter (Signed)
The patient contacted her insurance carrier and she has to pay 20 % of the amount for the pads. The insurance company informed the patient to call her doctors office to have them send in the prescription to [facility] [phone] and they will ok her to have them. Give the patient a call when this is done.

## 2014-03-02 ENCOUNTER — Encounter: Payer: Self-pay | Admitting: *Deleted

## 2014-03-02 ENCOUNTER — Encounter: Payer: Self-pay | Admitting: Internal Medicine

## 2014-03-02 ENCOUNTER — Ambulatory Visit (INDEPENDENT_AMBULATORY_CARE_PROVIDER_SITE_OTHER): Payer: Medicare Other | Admitting: Internal Medicine

## 2014-03-02 VITALS — BP 148/70 | HR 65 | Temp 97.7°F | Resp 15 | Wt 199.0 lb

## 2014-03-02 DIAGNOSIS — I10 Essential (primary) hypertension: Secondary | ICD-10-CM

## 2014-03-02 DIAGNOSIS — R5383 Other fatigue: Secondary | ICD-10-CM

## 2014-03-02 DIAGNOSIS — E785 Hyperlipidemia, unspecified: Secondary | ICD-10-CM

## 2014-03-02 DIAGNOSIS — R609 Edema, unspecified: Secondary | ICD-10-CM

## 2014-03-02 DIAGNOSIS — E118 Type 2 diabetes mellitus with unspecified complications: Secondary | ICD-10-CM

## 2014-03-02 DIAGNOSIS — R5381 Other malaise: Secondary | ICD-10-CM | POA: Insufficient documentation

## 2014-03-02 DIAGNOSIS — E039 Hypothyroidism, unspecified: Secondary | ICD-10-CM

## 2014-03-02 LAB — CBC WITH DIFFERENTIAL/PLATELET
BASOS ABS: 0.1 10*3/uL (ref 0.0–0.1)
Basophils Relative: 0.6 % (ref 0.0–3.0)
EOS ABS: 0.4 10*3/uL (ref 0.0–0.7)
Eosinophils Relative: 3.9 % (ref 0.0–5.0)
HCT: 41 % (ref 36.0–46.0)
Hemoglobin: 13.7 g/dL (ref 12.0–15.0)
Lymphocytes Relative: 31.3 % (ref 12.0–46.0)
Lymphs Abs: 3.1 10*3/uL (ref 0.7–4.0)
MCHC: 33.5 g/dL (ref 30.0–36.0)
MCV: 92.5 fl (ref 78.0–100.0)
MONO ABS: 0.6 10*3/uL (ref 0.1–1.0)
Monocytes Relative: 6.5 % (ref 3.0–12.0)
NEUTROS PCT: 57.7 % (ref 43.0–77.0)
Neutro Abs: 5.7 10*3/uL (ref 1.4–7.7)
PLATELETS: 263 10*3/uL (ref 150.0–400.0)
RBC: 4.43 Mil/uL (ref 3.87–5.11)
RDW: 13 % (ref 11.5–14.6)
WBC: 9.9 10*3/uL (ref 4.5–10.5)

## 2014-03-02 LAB — POCT URINALYSIS DIPSTICK
Bilirubin, UA: NEGATIVE
Blood, UA: NEGATIVE
Glucose, UA: NEGATIVE
Ketones, UA: NEGATIVE
Leukocytes, UA: NEGATIVE
Nitrite, UA: NEGATIVE
PROTEIN UA: NEGATIVE
Spec Grav, UA: <=1.005
UROBILINOGEN UA: 0.2
pH, UA: 5.5

## 2014-03-02 LAB — MICROALBUMIN / CREATININE URINE RATIO
CREATININE, U: 41.1 mg/dL
MICROALB/CREAT RATIO: 5.6 mg/g (ref 0.0–30.0)
Microalb, Ur: 2.3 mg/dL — ABNORMAL HIGH (ref 0.0–1.9)

## 2014-03-02 LAB — COMPREHENSIVE METABOLIC PANEL
ALT: 24 U/L (ref 0–35)
AST: 24 U/L (ref 0–37)
Albumin: 4.2 g/dL (ref 3.5–5.2)
Alkaline Phosphatase: 119 U/L — ABNORMAL HIGH (ref 39–117)
BUN: 11 mg/dL (ref 6–23)
CALCIUM: 9.1 mg/dL (ref 8.4–10.5)
CHLORIDE: 105 meq/L (ref 96–112)
CO2: 24 mEq/L (ref 19–32)
Creatinine, Ser: 0.9 mg/dL (ref 0.4–1.2)
GFR: 60.25 mL/min (ref >60.00)
Glucose, Bld: 173 mg/dL — ABNORMAL HIGH (ref 70–99)
Potassium: 3.8 mEq/L (ref 3.5–5.1)
Sodium: 138 mEq/L (ref 135–145)
TOTAL PROTEIN: 7.6 g/dL (ref 6.0–8.3)
Total Bilirubin: 0.5 mg/dL (ref 0.3–1.2)

## 2014-03-02 LAB — HEMOGLOBIN A1C: Hgb A1c MFr Bld: 8.1 % — ABNORMAL HIGH (ref 4.6–6.5)

## 2014-03-02 LAB — LIPID PANEL
Cholesterol: 139 mg/dL (ref 0–200)
HDL: 43.5 mg/dL (ref >39.00)
LDL Cholesterol: 60 mg/dL (ref 0–99)
TRIGLYCERIDES: 176 mg/dL — AB (ref 0.0–149.0)
Total CHOL/HDL Ratio: 3
VLDL: 35.2 mg/dL (ref 0.0–40.0)

## 2014-03-02 LAB — TSH: TSH: 2.21 u[IU]/mL (ref 0.35–5.50)

## 2014-03-02 MED ORDER — TRAMADOL HCL 50 MG PO TABS: 50.0000 mg | ORAL_TABLET | Freq: Three times a day (TID) | ORAL | Status: DC | PRN

## 2014-03-02 MED ORDER — FUROSEMIDE 20 MG PO TABS: 20.0000 mg | ORAL_TABLET | Freq: Every day | ORAL | Status: DC

## 2014-03-02 NOTE — Assessment & Plan Note (Signed)
Patient reports some generalized fatigue recently. Will check CMP, CBC, TSH, A1c with labs.

## 2014-03-02 NOTE — Patient Instructions (Signed)
Stop taking Nitrofurantoin.   Start Furosemide 20mg  daily x 3 days.   We will check labs today.  Follow up on Monday.

## 2014-03-02 NOTE — Assessment & Plan Note (Signed)
Patient has been lost to followup for over 6 months. Will check A1c with labs today. Continue glipizide.

## 2014-03-02 NOTE — Assessment & Plan Note (Signed)
Will check TSH with labs today. Continue levothyroxine. 

## 2014-03-02 NOTE — Assessment & Plan Note (Signed)
Will check lipids and LFTs with labs. Continue atorvastatin. 

## 2014-03-02 NOTE — Progress Notes (Signed)
Subjective:    Patient ID: Alexandra English, female    DOB: 1927-01-03, 80 y.o.   MRN: [id number]  HPI 80YO female with DM, HTN presents for acute visit.  Generally feeling poorly over last few weeks. Notes increased swelling in both of her legs, some shortness of breath with exertion. Notes increased urinary incontinence. Recently put on Macrobid by her urologist. No fever, chills, chest pain.  DM - BG have been elevated near 140 fasting.   Review of Systems  Constitutional: Positive for fatigue. Negative for fever, chills, appetite change and unexpected weight change.  HENT: Negative for congestion, ear pain, sinus pressure, sore throat, trouble swallowing and voice change.   Eyes: Negative for visual disturbance.  Respiratory: Positive for shortness of breath (occasional on exertion). Negative for cough, wheezing and stridor.   Cardiovascular: Positive for leg swelling. Negative for chest pain and palpitations.  Gastrointestinal: Negative for nausea, vomiting, abdominal pain, diarrhea, constipation, blood in stool, abdominal distention and anal bleeding.  Genitourinary: Positive for urgency and frequency. Negative for dysuria, hematuria, flank pain and difficulty urinating.  Musculoskeletal: Negative for arthralgias, gait problem, myalgias and neck pain.  Skin: Negative for color change and rash.  Neurological: Negative for dizziness and headaches.  Hematological: Negative for adenopathy. Does not bruise/bleed easily.  Psychiatric/Behavioral: Negative for suicidal ideas, sleep disturbance and dysphoric mood. The patient is not nervous/anxious.        Objective:    BP 148/70  Pulse 65  Temp(Src) 97.7 F (36.5 C) (Oral)  Resp 15  Wt 199 lb (90.266 kg)  SpO2 93% Physical Exam  Constitutional: She is oriented to person, place, and time. She appears well-developed and well-nourished. No distress.  HENT:  Head: Normocephalic and atraumatic.  Right Ear: External ear normal.  Left  Ear: External ear normal.  Nose: Nose normal.  Mouth/Throat: Oropharynx is clear and moist. No oropharyngeal exudate.  Eyes: Conjunctivae are normal. Pupils are equal, round, and reactive to light. Right eye exhibits no discharge. Left eye exhibits no discharge. No scleral icterus.  Neck: Normal range of motion. Neck supple. No tracheal deviation present. No thyromegaly present.  Cardiovascular: Normal rate, regular rhythm, normal heart sounds and intact distal pulses.  Exam reveals no gallop and no friction rub.   No murmur heard. Pulmonary/Chest: Effort normal and breath sounds normal. No accessory muscle usage. Not tachypneic. No respiratory distress. She has no decreased breath sounds. She has no wheezes. She has no rhonchi. She has no rales. She exhibits no tenderness.  Musculoskeletal: Normal range of motion. She exhibits edema (pitting bilateral LE to mid shin). She exhibits no tenderness.  Lymphadenopathy:    She has no cervical adenopathy.  Neurological: She is alert and oriented to person, place, and time. No cranial nerve deficit. She exhibits normal muscle tone. Coordination normal.  Skin: Skin is warm and dry. No rash noted. She is not diaphoretic. No erythema. No pallor.  Psychiatric: She has a normal mood and affect. Her behavior is normal. Judgment and thought content normal.          Assessment & Plan:   Problem List Items Addressed This Visit   DM (diabetes mellitus), type 2 with complications - Primary     Patient has been lost to followup for over 6 months. Will check A1c with labs today. Continue glipizide.    Relevant Orders      Comprehensive metabolic panel      Hemoglobin A1c   Edema  Recent worsening symptoms of edema in her lower extremities. Encouraged her to keep legs elevated, limit intake of sodium rich foods. Will start back on furosemide 20 mg daily over the next 3 days and reassess next week.    Relevant Orders      Comprehensive metabolic panel        CBC with Differential      POCT Urinalysis Dipstick (Completed)   Hyperlipidemia     Will check lipids and LFTs with labs. Continue atorvastatin.    Relevant Medications      furosemide (LASIX) tablet   Other Relevant Orders      Lipid panel   Hypertension      BP Readings from Last 3 Encounters:  03/02/14 148/70  10/05/13 132/65  07/30/13 160/80   Blood pressure slightly elevated today. As noted, patient appears hypervolemic. Will add Lasix 20 mg daily over the next 3 days and reassess early next week.    Relevant Orders      Microalbumin / creatinine urine ratio   Hypothyroidism     Will check TSH with labs today. Continue levothyroxine.    Relevant Orders      TSH   Other malaise and fatigue     Patient reports some generalized fatigue recently. Will check CMP, CBC, TSH, A1c with labs.    Relevant Orders      CBC with Differential       Return in about 1 week (around 03/09/2014).

## 2014-03-02 NOTE — Assessment & Plan Note (Signed)
Recent worsening symptoms of edema in her lower extremities. Encouraged her to keep legs elevated, limit intake of sodium rich foods. Will start back on furosemide 20 mg daily over the next 3 days and reassess next week.

## 2014-03-02 NOTE — Assessment & Plan Note (Signed)
BP Readings from Last 3 Encounters:  03/02/14 148/70  10/05/13 132/65  07/30/13 160/80   Blood pressure slightly elevated today. As noted, patient appears hypervolemic. Will add Lasix 20 mg daily over the next 3 days and reassess early next week.

## 2014-03-03 ENCOUNTER — Telehealth: Payer: Self-pay | Admitting: Internal Medicine

## 2014-03-03 NOTE — Telephone Encounter (Signed)
Relevant patient education mailed to patient.  

## 2014-03-10 ENCOUNTER — Other Ambulatory Visit: Payer: Self-pay | Admitting: Internal Medicine

## 2014-03-11 ENCOUNTER — Encounter: Payer: Self-pay | Admitting: Internal Medicine

## 2014-03-11 ENCOUNTER — Ambulatory Visit (INDEPENDENT_AMBULATORY_CARE_PROVIDER_SITE_OTHER): Payer: Medicare Other | Admitting: Internal Medicine

## 2014-03-11 VITALS — BP 130/62 | HR 97 | Temp 97.9°F | Wt 198.0 lb

## 2014-03-11 DIAGNOSIS — F341 Dysthymic disorder: Secondary | ICD-10-CM

## 2014-03-11 DIAGNOSIS — R609 Edema, unspecified: Secondary | ICD-10-CM

## 2014-03-11 DIAGNOSIS — F419 Anxiety disorder, unspecified: Principal | ICD-10-CM

## 2014-03-11 DIAGNOSIS — F329 Major depressive disorder, single episode, unspecified: Secondary | ICD-10-CM

## 2014-03-11 NOTE — Patient Instructions (Signed)
Start Furosemide 20mg  daily for 3 days.  Follow up for recheck next week.

## 2014-03-11 NOTE — Assessment & Plan Note (Signed)
Symptoms recently worsened with ongoing issues with her son. Encouraged her to talk with her son. Offered support today. Plan follow up next week.

## 2014-03-11 NOTE — Assessment & Plan Note (Signed)
Symptoms relatively unchanged, however pt did not take furosemide. Will start Furosemide 20mg  daily. Plan repeat evaluation next week with repeat BMP.

## 2014-03-11 NOTE — Progress Notes (Signed)
Pre visit review using our clinic review tool, if applicable. No additional management support is needed unless otherwise documented below in the visit note. 

## 2014-03-11 NOTE — Progress Notes (Signed)
Subjective:    Patient ID: Alexandra English, female    DOB: 1927-11-21, 80 y.o.   MRN: [id number]  HPI 80YO female presents for follow up. Seen last week with increased LE edema. Instructed to start daily furosemide x 3 days, however she did not start the medication. Nonetheless, swelling in her legs has improved some. Continues to have some swelling, worse in the afternoons. Improved in the mornings.No shortness of breath.  She is tearful today describing ongoing issues with her son. He has taken her car. He has limited her finances, and she can no longer afford to have her hair cut and washed or to go to dinner with her friends once per month. She reports that her son only visits at Christmas. She is sad about this. She likes where she lives but misses time with family. She sometimes talks with her sister about this.  Review of Systems  Constitutional: Negative for fever, chills, appetite change, fatigue and unexpected weight change.  HENT: Negative for congestion, ear pain, sinus pressure, sore throat, trouble swallowing and voice change.   Eyes: Negative for visual disturbance.  Respiratory: Negative for cough, shortness of breath, wheezing and stridor.   Cardiovascular: Positive for leg swelling. Negative for chest pain and palpitations.  Gastrointestinal: Negative for nausea, vomiting, abdominal pain, diarrhea, constipation, blood in stool, abdominal distention and anal bleeding.  Genitourinary: Negative for dysuria and flank pain.  Musculoskeletal: Negative for arthralgias, gait problem, myalgias and neck pain.  Skin: Negative for color change and rash.  Neurological: Negative for dizziness and headaches.  Hematological: Negative for adenopathy. Does not bruise/bleed easily.  Psychiatric/Behavioral: Positive for dysphoric mood. Negative for suicidal ideas and sleep disturbance. The patient is not nervous/anxious.        Objective:    BP 130/62  Pulse 97  Temp(Src) 97.9 F (36.6  C) (Oral)  Wt 198 lb (89.812 kg)  SpO2 95% Physical Exam  Constitutional: She is oriented to person, place, and time. She appears well-developed and well-nourished. No distress.  HENT:  Head: Normocephalic and atraumatic.  Right Ear: External ear normal.  Left Ear: External ear normal.  Nose: Nose normal.  Mouth/Throat: Oropharynx is clear and moist. No oropharyngeal exudate.  Eyes: Conjunctivae are normal. Pupils are equal, round, and reactive to light. Right eye exhibits no discharge. Left eye exhibits no discharge. No scleral icterus.  Neck: Normal range of motion. Neck supple. No tracheal deviation present. No thyromegaly present.  Cardiovascular: Normal rate, regular rhythm, normal heart sounds and intact distal pulses.  Exam reveals no gallop and no friction rub.   No murmur heard. Pulmonary/Chest: Effort normal and breath sounds normal. No accessory muscle usage. Not tachypneic. No respiratory distress. She has no decreased breath sounds. She has no wheezes. She has no rhonchi. She has no rales. She exhibits no tenderness.  Musculoskeletal: Normal range of motion. She exhibits edema (bilateral LE pitting to mid shin). She exhibits no tenderness.  Lymphadenopathy:    She has no cervical adenopathy.  Neurological: She is alert and oriented to person, place, and time. No cranial nerve deficit. She exhibits normal muscle tone. Coordination normal.  Skin: Skin is warm and dry. No rash noted. She is not diaphoretic. No erythema. No pallor.  Psychiatric: Her speech is normal and behavior is normal. Judgment and thought content normal. Cognition and memory are normal. She exhibits a depressed mood.          Assessment & Plan:   Problem List Items Addressed  This Visit   Anxiety and depression - Primary     Symptoms recently worsened with ongoing issues with her son. Encouraged her to talk with her son. Offered support today. Plan follow up next week.    Edema     Symptoms relatively  unchanged, however pt did not take furosemide. Will start Furosemide 20mg  daily. Plan repeat evaluation next week with repeat BMP.        Return in about 1 week (around 03/18/2014) for Recheck.

## 2014-03-14 ENCOUNTER — Telehealth: Payer: Self-pay | Admitting: *Deleted

## 2014-03-14 MED ORDER — FUROSEMIDE 20 MG PO TABS: 20.0000 mg | ORAL_TABLET | Freq: Every day | ORAL | Status: DC

## 2014-03-14 NOTE — Telephone Encounter (Signed)
Patient never received prescription for Furosemide. Per patient she is already taking HCTZ, does she need to take both medications? She went to her cabinet and read me the name of the HCTZ off the bottle to me.

## 2014-03-14 NOTE — Telephone Encounter (Signed)
Called and spoke with patient, [facility]. Her feet and ankles has went down a lot from the swelling. Informed patient if she can find a ride then [facility] per [facility]. Also called [facility] they did not have a prescription on file for Furosemide so prescription was escribed to them. The original prescription was set to No print which is why they did not receive it.

## 2014-03-14 NOTE — Telephone Encounter (Signed)
Yes, she should take additional Furosemide 20mg  daily x 3 days. We can confirm with pharmacy that they received this.

## 2014-04-05 ENCOUNTER — Ambulatory Visit: Payer: Medicare Other | Admitting: Internal Medicine

## 2014-04-08 ENCOUNTER — Ambulatory Visit (INDEPENDENT_AMBULATORY_CARE_PROVIDER_SITE_OTHER): Payer: Medicare Other | Admitting: Internal Medicine

## 2014-04-08 ENCOUNTER — Encounter: Payer: Self-pay | Admitting: Internal Medicine

## 2014-04-08 VITALS — BP 140/70 | HR 90 | Temp 97.6°F | Wt 195.0 lb

## 2014-04-08 DIAGNOSIS — R300 Dysuria: Secondary | ICD-10-CM

## 2014-04-08 DIAGNOSIS — E118 Type 2 diabetes mellitus with unspecified complications: Secondary | ICD-10-CM

## 2014-04-08 DIAGNOSIS — R609 Edema, unspecified: Secondary | ICD-10-CM

## 2014-04-08 DIAGNOSIS — Z23 Encounter for immunization: Secondary | ICD-10-CM

## 2014-04-08 DIAGNOSIS — I10 Essential (primary) hypertension: Secondary | ICD-10-CM

## 2014-04-08 LAB — POCT URINALYSIS DIPSTICK
BILIRUBIN UA: NEGATIVE
GLUCOSE UA: NEGATIVE
Ketones, UA: NEGATIVE
Leukocytes, UA: NEGATIVE
Nitrite, UA: NEGATIVE
Protein, UA: NEGATIVE
RBC UA: NEGATIVE
Spec Grav, UA: <=1.005
UROBILINOGEN UA: 0.2
pH, UA: 5.5

## 2014-04-08 NOTE — Assessment & Plan Note (Signed)
Lab Results  Component Value Date   HGBA1C 8.1* 03/02/2014   Pt reports better control of BG at home. Will plan to repeat A1c with labs in 05/2014. Continue Glipizide.

## 2014-04-08 NOTE — Progress Notes (Signed)
Subjective:    Patient ID: Alexandra English, female    DOB: 08-31-1927, 80 y.o.   MRN: [id number]  HPI 80YO female presents for follow up.  Edema - Taking lasix occasionally, but not every day. Swelling in LE generally well controlled.  DM - BG running near 140. Compliant with medication. Continues to struggle with diet at assisted living.  Continues to feel sad about ongoing relationship issues with her son, Gery PrayBarry. No change since last visit.  Review of Systems  Constitutional: Negative for fever, chills, appetite change, fatigue and unexpected weight change.  HENT: Negative for congestion, ear pain, sinus pressure, sore throat, trouble swallowing and voice change.   Eyes: Negative for visual disturbance.  Respiratory: Negative for cough, shortness of breath, wheezing and stridor.   Cardiovascular: Positive for leg swelling. Negative for chest pain and palpitations.  Gastrointestinal: Negative for nausea, vomiting, abdominal pain, diarrhea, constipation, blood in stool, abdominal distention and anal bleeding.  Genitourinary: Negative for dysuria and flank pain.  Musculoskeletal: Negative for arthralgias, gait problem, myalgias and neck pain.  Skin: Negative for color change and rash.  Neurological: Negative for dizziness and headaches.  Hematological: Negative for adenopathy. Does not bruise/bleed easily.  Psychiatric/Behavioral: Positive for dysphoric mood. Negative for suicidal ideas and sleep disturbance. The patient is not nervous/anxious.        Objective:    BP 140/70  Pulse 90  Temp(Src) 97.6 F (36.4 C) (Oral)  Wt 195 lb (88.451 kg)  SpO2 96% Physical Exam  Constitutional: She is oriented to person, place, and time. She appears well-developed and well-nourished. No distress.  HENT:  Head: Normocephalic and atraumatic.  Right Ear: External ear normal.  Left Ear: External ear normal.  Nose: Nose normal.  Mouth/Throat: Oropharynx is clear and moist. No oropharyngeal  exudate.  Eyes: Conjunctivae are normal. Pupils are equal, round, and reactive to light. Right eye exhibits no discharge. Left eye exhibits no discharge. No scleral icterus.  Neck: Normal range of motion. Neck supple. No tracheal deviation present. No thyromegaly present.  Cardiovascular: Normal rate, regular rhythm, normal heart sounds and intact distal pulses.  Exam reveals no gallop and no friction rub.   No murmur heard. Pulmonary/Chest: Effort normal and breath sounds normal. No respiratory distress. She has no wheezes. She has no rales. She exhibits no tenderness.  Musculoskeletal: Normal range of motion. She exhibits edema (trace edema bilateral LE). She exhibits no tenderness.  Lymphadenopathy:    She has no cervical adenopathy.  Neurological: She is alert and oriented to person, place, and time. No cranial nerve deficit. She exhibits normal muscle tone. Coordination normal.  Skin: Skin is warm and dry. No rash noted. She is not diaphoretic. No erythema. No pallor.  Psychiatric: Her speech is normal and behavior is normal. Judgment and thought content normal. Her mood appears anxious. Cognition and memory are normal. She exhibits a depressed mood. She expresses no suicidal ideation.          Assessment & Plan:   Problem List Items Addressed This Visit   DM (diabetes mellitus), type 2 with complications      Lab Results  Component Value Date   HGBA1C 8.1* 03/02/2014   Pt reports better control of BG at home. Will plan to repeat A1c with labs in 05/2014. Continue Glipizide.    Edema - Primary     Symptoms well controlled with prn lasix. Will continue.    Hypertension      BP Readings from Last 3  Encounters:  04/08/14 140/70  03/11/14 130/62  03/02/14 148/70   BP generally well controlled with current medications. Will continue.     Other Visit Diagnoses   Dysuria        Relevant Orders       POCT Urinalysis Dipstick (Completed)    Need for vaccination with  13-polyvalent pneumococcal conjugate vaccine        Relevant Orders       Pneumococcal conjugate vaccine 13-valent (Completed)        Return in about 4 weeks (around 05/06/2014) for Wellness Visit.

## 2014-04-08 NOTE — Assessment & Plan Note (Signed)
BP Readings from Last 3 Encounters:  04/08/14 140/70  03/11/14 130/62  03/02/14 148/70   BP generally well controlled with current medications. Will continue.

## 2014-04-08 NOTE — Progress Notes (Signed)
Pre visit review using our clinic review tool, if applicable. No additional management support is needed unless otherwise documented below in the visit note. 

## 2014-04-08 NOTE — Assessment & Plan Note (Signed)
Symptoms well controlled with prn lasix. Will continue.

## 2014-04-11 ENCOUNTER — Encounter: Payer: Self-pay | Admitting: *Deleted

## 2014-04-15 ENCOUNTER — Other Ambulatory Visit: Payer: Self-pay | Admitting: Internal Medicine

## 2014-04-18 ENCOUNTER — Emergency Department: Payer: Self-pay | Admitting: Emergency Medicine

## 2014-05-13 ENCOUNTER — Encounter: Payer: Self-pay | Admitting: Internal Medicine

## 2014-05-17 ENCOUNTER — Encounter: Payer: Self-pay | Admitting: Internal Medicine

## 2014-05-17 ENCOUNTER — Ambulatory Visit (INDEPENDENT_AMBULATORY_CARE_PROVIDER_SITE_OTHER): Payer: Medicare Other | Admitting: Internal Medicine

## 2014-05-17 VITALS — BP 122/58 | HR 72 | Temp 97.9°F | Ht 65.0 in | Wt 199.5 lb

## 2014-05-17 DIAGNOSIS — M79609 Pain in unspecified limb: Secondary | ICD-10-CM

## 2014-05-17 DIAGNOSIS — M79601 Pain in right arm: Secondary | ICD-10-CM | POA: Insufficient documentation

## 2014-05-17 DIAGNOSIS — R609 Edema, unspecified: Secondary | ICD-10-CM

## 2014-05-17 DIAGNOSIS — E118 Type 2 diabetes mellitus with unspecified complications: Secondary | ICD-10-CM

## 2014-05-17 MED ORDER — ACCU-CHEK SOFTCLIX LANCET DEV MISC: Status: DC

## 2014-05-17 NOTE — Progress Notes (Signed)
Pre visit review using our clinic review tool, if applicable. No additional management support is needed unless otherwise documented below in the visit note. 

## 2014-05-17 NOTE — Progress Notes (Signed)
Subjective:    Patient ID: Alexandra English, female    DOB: 10-Mar-1927, 80 y.o.   MRN: [id number]  HPI 80YO female presents for acute visit.  Larey SeatFell in parking lot and injured right arm 5/4. Was seen in ED, and had xrays, but had no fracture. Continues to have some aching pain in right elbow. Has limited ROM after previous stroke. Not taking anything for pain.  DM - Blood sugars near 140s mostly. Compliant with meds.  Edema - has swelling in bilateral lower extremities. Worse over last several weeks. Does not completely resolve at night.  Review of Systems  Constitutional: Negative for fever, chills, appetite change, fatigue and unexpected weight change.  HENT: Negative for congestion, ear pain, sinus pressure, sore throat, trouble swallowing and voice change.   Eyes: Negative for visual disturbance.  Respiratory: Negative for cough, shortness of breath, wheezing and stridor.   Cardiovascular: Positive for leg swelling. Negative for chest pain and palpitations.  Gastrointestinal: Negative for nausea, vomiting, abdominal pain, diarrhea, constipation, blood in stool, abdominal distention and anal bleeding.  Genitourinary: Negative for dysuria and flank pain.  Musculoskeletal: Positive for arthralgias and myalgias. Negative for gait problem and neck pain.  Skin: Negative for color change and rash.  Neurological: Negative for dizziness and headaches.  Hematological: Negative for adenopathy. Does not bruise/bleed easily.  Psychiatric/Behavioral: Negative for suicidal ideas, sleep disturbance and dysphoric mood. The patient is not nervous/anxious.        Objective:    BP 122/58  Pulse 72  Temp(Src) 97.9 F (36.6 C) (Oral)  Ht 5\' 5"  (1.651 m)  Wt 199 lb 8 oz (90.493 kg)  BMI 33.20 kg/m2  SpO2 95% Physical Exam  Constitutional: She is oriented to person, place, and time. She appears well-developed and well-nourished. No distress.  HENT:  Head: Normocephalic and atraumatic.  Right  Ear: External ear normal.  Left Ear: External ear normal.  Nose: Nose normal.  Mouth/Throat: Oropharynx is clear and moist. No oropharyngeal exudate.  Eyes: Conjunctivae are normal. Pupils are equal, round, and reactive to light. Right eye exhibits no discharge. Left eye exhibits no discharge. No scleral icterus.  Neck: Normal range of motion. Neck supple. No tracheal deviation present. No thyromegaly present.  Cardiovascular: Normal rate, regular rhythm, normal heart sounds and intact distal pulses.  Exam reveals no gallop and no friction rub.   No murmur heard. Pulmonary/Chest: Effort normal and breath sounds normal. No accessory muscle usage. Not tachypneic. No respiratory distress. She has no decreased breath sounds. She has no wheezes. She has no rhonchi. She has no rales. She exhibits no tenderness.  Musculoskeletal: Normal range of motion. She exhibits edema (BLE pitting to upper shin).       Right elbow: She exhibits normal range of motion and no swelling. Tenderness found. Lateral epicondyle tenderness noted.  Lymphadenopathy:    She has no cervical adenopathy.  Neurological: She is alert and oriented to person, place, and time. No cranial nerve deficit. She exhibits normal muscle tone. Coordination normal.  Skin: Skin is warm and dry. No rash noted. She is not diaphoretic. No erythema. No pallor.  Psychiatric: She has a normal mood and affect. Her behavior is normal. Judgment and thought content normal.          Assessment & Plan:   Problem List Items Addressed This Visit     Unprioritized   DM (diabetes mellitus), type 2 with complications     BG improved per pt report. Will plan  to recheck A1c in 06/2014.    Edema     BLE edema noted by pt. Encouraged avoidance of sodium. Encouraged her to keep legs elevated. Encouraged use of compression hose.Will increase Furosemide to 20mg  po bid for next 3 days to help with swelling.    Right arm pain     Right arm pain after fall.  Exam normal except for mild tenderness over lateral epicondyle. Pt reports that xray was normal. Recommended PT evaluation, however pt would like to hold off for now and continue to monitor. Follow up in 4 weeks and prn.     Other Visit Diagnoses   Type II or unspecified type diabetes mellitus with unspecified complication, not stated as uncontrolled    -  Primary    Relevant Medications       Lancet Devices (ACCU-CHEK SOFTCLIX) lancets        Return in about 4 weeks (around 06/14/2014) for Recheck.

## 2014-05-17 NOTE — Assessment & Plan Note (Signed)
Right arm pain after fall. Exam normal except for mild tenderness over lateral epicondyle. Pt reports that xray was normal. Recommended PT evaluation, however pt would like to hold off for now and continue to monitor. Follow up in 4 weeks and prn.

## 2014-05-17 NOTE — Assessment & Plan Note (Signed)
BLE edema noted by pt. Encouraged avoidance of sodium. Encouraged her to keep legs elevated. Encouraged use of compression hose.Will increase Furosemide to 20mg  po bid for next 3 days to help with swelling.

## 2014-05-17 NOTE — Patient Instructions (Signed)
Increase Furosemide to 20mg  twice daily for the next 3 days to help with swelling in legs.  Follow up in 4 weeks.

## 2014-05-17 NOTE — Assessment & Plan Note (Signed)
BG improved per pt report. Will plan to recheck A1c in 06/2014.

## 2014-05-24 ENCOUNTER — Ambulatory Visit (INDEPENDENT_AMBULATORY_CARE_PROVIDER_SITE_OTHER): Payer: Medicare Other

## 2014-05-24 ENCOUNTER — Ambulatory Visit (INDEPENDENT_AMBULATORY_CARE_PROVIDER_SITE_OTHER): Payer: Medicare Other | Admitting: Podiatry

## 2014-05-24 VITALS — BP 145/73 | HR 101 | Resp 16

## 2014-05-24 DIAGNOSIS — E1140 Type 2 diabetes mellitus with diabetic neuropathy, unspecified: Secondary | ICD-10-CM

## 2014-05-24 DIAGNOSIS — E1149 Type 2 diabetes mellitus with other diabetic neurological complication: Secondary | ICD-10-CM

## 2014-05-24 DIAGNOSIS — M7750 Other enthesopathy of unspecified foot: Secondary | ICD-10-CM

## 2014-05-24 DIAGNOSIS — R609 Edema, unspecified: Secondary | ICD-10-CM

## 2014-05-24 NOTE — Progress Notes (Signed)
Subjective:     Patient ID: Alexandra English, female   DOB: [birth year], 80 y.o.   MRN: [id number]  HPI patient states I have a lot of swelling in my feet for the last month and I been on a fluid pill which has not helped me and I have had my physical by my physician   Review of Systems     Objective:   Physical Exam Neurovascular status intact with no loss of muscle strength with range of motion mildly diminished. Patient has moderate forefoot edema both feet with negative Homans sign noted    Assessment:     Difficult to understand what may be causing this but at this point it appears to be a nondescript swelling of the feet    Plan:     Reviewed x-rays and advised on elevation compression and dispensed 2 Ace wraps to wear at nighttime after soaks. If symptoms persist reappoint

## 2014-06-15 ENCOUNTER — Other Ambulatory Visit: Payer: Self-pay | Admitting: *Deleted

## 2014-06-15 MED ORDER — GLUCOSE BLOOD VI STRP: ORAL_STRIP | Status: DC

## 2014-06-15 MED ORDER — ONETOUCH ULTRASOFT LANCETS MISC: Status: DC

## 2014-06-15 MED ORDER — ONETOUCH ULTRA SYSTEM W/DEVICE KIT: 1.0000 | PACK | Freq: Once | Status: DC

## 2014-06-15 NOTE — Telephone Encounter (Signed)
Fax from pharmacy, insurance will no longer cover Contour meter and supplies. Need new Rxs for OneTouch Ultra meter, strips, and lancets.

## 2014-07-11 ENCOUNTER — Other Ambulatory Visit: Payer: Self-pay | Admitting: Internal Medicine

## 2014-07-12 ENCOUNTER — Other Ambulatory Visit: Payer: Self-pay | Admitting: Internal Medicine

## 2014-08-12 ENCOUNTER — Telehealth: Payer: Self-pay | Admitting: Internal Medicine

## 2014-08-12 NOTE — Telephone Encounter (Signed)
Pt called in and stated she is needing a follow up appt with Dr.Walker and stated her legs and feet are bothering her and swelling from where her diabetes is out of control and she would like to be seen in the afternoon and would like to have the 8th if possible.

## 2014-08-19 ENCOUNTER — Encounter: Payer: Self-pay | Admitting: Internal Medicine

## 2014-08-19 ENCOUNTER — Ambulatory Visit (INDEPENDENT_AMBULATORY_CARE_PROVIDER_SITE_OTHER): Payer: Medicare Other | Admitting: Internal Medicine

## 2014-08-19 VITALS — BP 120/58 | HR 77 | Temp 98.2°F | Ht 65.0 in | Wt 200.2 lb

## 2014-08-19 DIAGNOSIS — F341 Dysthymic disorder: Secondary | ICD-10-CM

## 2014-08-19 DIAGNOSIS — Z23 Encounter for immunization: Secondary | ICD-10-CM

## 2014-08-19 DIAGNOSIS — E785 Hyperlipidemia, unspecified: Secondary | ICD-10-CM

## 2014-08-19 DIAGNOSIS — E1149 Type 2 diabetes mellitus with other diabetic neurological complication: Secondary | ICD-10-CM

## 2014-08-19 DIAGNOSIS — F419 Anxiety disorder, unspecified: Secondary | ICD-10-CM

## 2014-08-19 DIAGNOSIS — F329 Major depressive disorder, single episode, unspecified: Secondary | ICD-10-CM

## 2014-08-19 DIAGNOSIS — E118 Type 2 diabetes mellitus with unspecified complications: Secondary | ICD-10-CM

## 2014-08-19 DIAGNOSIS — E1142 Type 2 diabetes mellitus with diabetic polyneuropathy: Secondary | ICD-10-CM | POA: Insufficient documentation

## 2014-08-19 DIAGNOSIS — I10 Essential (primary) hypertension: Secondary | ICD-10-CM

## 2014-08-19 LAB — COMPREHENSIVE METABOLIC PANEL
ALT: 22 U/L (ref 0–35)
AST: 25 U/L (ref 0–37)
Albumin: 3.6 g/dL (ref 3.5–5.2)
Alkaline Phosphatase: 113 U/L (ref 39–117)
BILIRUBIN TOTAL: 0.6 mg/dL (ref 0.2–1.2)
BUN: 13 mg/dL (ref 6–23)
CO2: 24 mEq/L (ref 19–32)
Calcium: 8.9 mg/dL (ref 8.4–10.5)
Chloride: 106 mEq/L (ref 96–112)
Creatinine, Ser: 1 mg/dL (ref 0.4–1.2)
GFR: 59.45 mL/min — ABNORMAL LOW (ref >60.00)
Glucose, Bld: 165 mg/dL — ABNORMAL HIGH (ref 70–99)
Potassium: 3.5 mEq/L (ref 3.5–5.1)
SODIUM: 138 meq/L (ref 135–145)
TOTAL PROTEIN: 6.8 g/dL (ref 6.0–8.3)

## 2014-08-19 LAB — LIPID PANEL
CHOL/HDL RATIO: 4
CHOLESTEROL: 143 mg/dL (ref 0–200)
HDL: 38.7 mg/dL — ABNORMAL LOW (ref >39.00)
LDL CALC: 68 mg/dL (ref 0–99)
NonHDL: 104.3
TRIGLYCERIDES: 182 mg/dL — AB (ref 0.0–149.0)
VLDL: 36.4 mg/dL (ref 0.0–40.0)

## 2014-08-19 LAB — MICROALBUMIN / CREATININE URINE RATIO
Creatinine,U: 41.5 mg/dL
Microalb Creat Ratio: 10.8 mg/g (ref 0.0–30.0)
Microalb, Ur: 4.5 mg/dL — ABNORMAL HIGH (ref 0.0–1.9)

## 2014-08-19 LAB — HEMOGLOBIN A1C: Hgb A1c MFr Bld: 9.3 % — ABNORMAL HIGH (ref 4.6–6.5)

## 2014-08-19 MED ORDER — GABAPENTIN 100 MG PO CAPS: 100.0000 mg | ORAL_CAPSULE | Freq: Every day | ORAL | Status: DC

## 2014-08-19 MED ORDER — ONETOUCH ULTRA SYSTEM W/DEVICE KIT: 1.0000 | PACK | Freq: Once | Status: AC

## 2014-08-19 NOTE — Assessment & Plan Note (Signed)
Symptoms recently worsened with more depression. Recommended increasing Mirtazapine, but she prefers to hold off for now. Will continue to monitor. Encouraged her to participate in activities with her friends at [facility].

## 2014-08-19 NOTE — Progress Notes (Signed)
Pre visit review using our clinic review tool, if applicable. No additional management support is needed unless otherwise documented below in the visit note. 

## 2014-08-19 NOTE — Addendum Note (Signed)
Addended by: Marchia Meiers on: 08/19/2014 01:26 PM   Modules accepted: Orders

## 2014-08-19 NOTE — Assessment & Plan Note (Signed)
Will check lipids with labs today.  

## 2014-08-19 NOTE — Assessment & Plan Note (Signed)
Lab Results  Component Value Date   HGBA1C 8.1* 03/02/2014   Will recheck A1c with labs today. Continue Glipizide.

## 2014-08-19 NOTE — Patient Instructions (Addendum)
Labs today.  Start Neurontin  at bedtime to help with leg pain.  Follow up in 3 months.

## 2014-08-19 NOTE — Assessment & Plan Note (Signed)
BP Readings from Last 3 Encounters:  08/19/14 120/58  05/24/14 145/73  05/17/14 122/58   BP well controlled on current medications. Will continue. Renal function with labs today.

## 2014-08-19 NOTE — Progress Notes (Signed)
Subjective:    Patient ID: Alexandra English, female    DOB: April 14, 1927, 80 y.o.   MRN: [id number]  HPI 80YO female presents for acute visit.  DM - Unable to get BG readings because meter not working. Compliant with medications.  Notes that she has been keeping to herself. Very little interaction with her family or other residents at [facility]. "Not sure" if she would say she is feeling depressed. Mostly watches TV in her room during the day. Stays up late, and wakes up late, but this is normal for her.  She notes worsening pain in her bilateral lower legs. Described as burning. Prominent at night. Not taking anything for this.  Review of Systems  Constitutional: Negative for fever, chills, appetite change, fatigue and unexpected weight change.  Eyes: Negative for visual disturbance.  Respiratory: Negative for shortness of breath.   Cardiovascular: Positive for leg swelling (bilateral LE to mid calf). Negative for chest pain.  Gastrointestinal: Negative for nausea, vomiting, abdominal pain, diarrhea, constipation and blood in stool.  Musculoskeletal: Positive for arthralgias and myalgias (legs).  Skin: Negative for color change and rash.  Hematological: Negative for adenopathy. Does not bruise/bleed easily.  Psychiatric/Behavioral: Negative for dysphoric mood. The patient is not nervous/anxious.        Objective:    BP 120/58  Pulse 77  Temp(Src) 98.2 F (36.8 C) (Oral)  Ht  (1.651 m)  Wt 200 lb 4 oz (90.833 kg)  BMI 33.32 kg/m2  SpO2 97% Physical Exam  Constitutional: She is oriented to person, place, and time. She appears well-developed and well-nourished. No distress.  HENT:  Head: Normocephalic and atraumatic.  Right Ear: External ear normal.  Left Ear: External ear normal.  Nose: Nose normal.  Mouth/Throat: Oropharynx is clear and moist. No oropharyngeal exudate.  Eyes: Conjunctivae are normal. Pupils are equal, round, and reactive to light. Right eye exhibits  no discharge. Left eye exhibits no discharge. No scleral icterus.  Neck: Normal range of motion. Neck supple. No tracheal deviation present. No thyromegaly present.  Cardiovascular: Normal rate, regular rhythm, normal heart sounds and intact distal pulses.  Exam reveals no gallop and no friction rub.   No murmur heard. Pulmonary/Chest: Effort normal and breath sounds normal. No accessory muscle usage. Not tachypneic. No respiratory distress. She has no decreased breath sounds. She has no wheezes. She has no rhonchi. She has no rales. She exhibits no tenderness.  Musculoskeletal: Normal range of motion. She exhibits edema (pitting to mid calf bilaterally). She exhibits no tenderness.  Lymphadenopathy:    She has no cervical adenopathy.  Neurological: She is alert and oriented to person, place, and time. No cranial nerve deficit. She exhibits normal muscle tone. Coordination normal.  Skin: Skin is warm and dry. No rash noted. She is not diaphoretic. No erythema. No pallor.  Psychiatric: Her speech is normal and behavior is normal. Judgment and thought content normal. Cognition and memory are normal. She exhibits a depressed mood.          Assessment & Plan:   Problem List Items Addressed This Visit     Unprioritized   Anxiety and depression     Symptoms recently worsened with more depression. Recommended increasing Mirtazapine, but she prefers to hold off for now. Will continue to monitor. Encouraged her to participate in activities with her friends at [facility].    Diabetic polyneuropathy associated with type 2 diabetes mellitus     Worsening neuropathic leg pain. Will start  Neurontin  po at bedtime. Follow up if symptoms are not improving and in 3 months.    Relevant Medications      gabapentin (NEURONTIN) capsule   DM (diabetes mellitus), type 2 with complications - Primary      Lab Results  Component Value Date   HGBA1C 8.1* 03/02/2014   Will recheck A1c with labs today.  Continue Glipizide.    Relevant Orders      Comprehensive metabolic panel      Hemoglobin A1c      Lipid panel      Microalbumin / creatinine urine ratio   Hyperlipidemia     Will check lipids with labs today.    Hypertension      BP Readings from Last 3 Encounters:  08/19/14 120/58  05/24/14 145/73  05/17/14 122/58   BP well controlled on current medications. Will continue. Renal function with labs today.        Return in about 3 months (around 11/18/2014) for Recheck of Diabetes.

## 2014-08-19 NOTE — Assessment & Plan Note (Signed)
Worsening neuropathic leg pain. Will start Neurontin  po at bedtime. Follow up if symptoms are not improving and in 3 months.

## 2014-09-29 ENCOUNTER — Encounter: Payer: Self-pay | Admitting: Internal Medicine

## 2014-09-29 ENCOUNTER — Ambulatory Visit (INDEPENDENT_AMBULATORY_CARE_PROVIDER_SITE_OTHER): Payer: Medicare Other | Admitting: Internal Medicine

## 2014-09-29 VITALS — BP 108/58 | HR 74 | Temp 97.9°F | Ht 65.0 in | Wt 194.0 lb

## 2014-09-29 DIAGNOSIS — F418 Other specified anxiety disorders: Secondary | ICD-10-CM

## 2014-09-29 DIAGNOSIS — F329 Major depressive disorder, single episode, unspecified: Secondary | ICD-10-CM

## 2014-09-29 DIAGNOSIS — R609 Edema, unspecified: Secondary | ICD-10-CM

## 2014-09-29 DIAGNOSIS — E118 Type 2 diabetes mellitus with unspecified complications: Secondary | ICD-10-CM

## 2014-09-29 DIAGNOSIS — I10 Essential (primary) hypertension: Secondary | ICD-10-CM

## 2014-09-29 DIAGNOSIS — E1142 Type 2 diabetes mellitus with diabetic polyneuropathy: Secondary | ICD-10-CM

## 2014-09-29 DIAGNOSIS — F419 Anxiety disorder, unspecified: Secondary | ICD-10-CM

## 2014-09-29 MED ORDER — GLIPIZIDE 10 MG PO TABS: 10.0000 mg | ORAL_TABLET | Freq: Two times a day (BID) | ORAL | Status: DC

## 2014-09-29 NOTE — Assessment & Plan Note (Signed)
Symptoms of diabetic nerve pain have improved without neurontin. Will continue to monitor.

## 2014-09-29 NOTE — Progress Notes (Signed)
Subjective:    Patient ID: Alexandra KempBetty L Dieterich, female    DOB: 1927-11-01, 80 y.o.   MRN: [id number]  HPI 80YO female presents for follow up.  DM - BG have been elevated. Most fasting BG in the 170s. No blood sugars over 200. Compliant with medication including glipizide daily.  Neuropathy - Did not start Neurontin. She was concerned about side effects. No current pain in legs.  Depression - Mood has been improved. Son may be moving closer to home.  Review of Systems  Constitutional: Negative for fever, chills, appetite change, fatigue and unexpected weight change.  Eyes: Negative for visual disturbance.  Respiratory: Negative for shortness of breath.   Cardiovascular: Negative for chest pain and leg swelling.  Gastrointestinal: Negative for nausea, vomiting, abdominal pain, diarrhea and constipation.  Musculoskeletal: Negative for myalgias.  Skin: Negative for color change and rash.  Neurological: Negative for weakness.  Hematological: Negative for adenopathy. Does not bruise/bleed easily.  Psychiatric/Behavioral: Negative for sleep disturbance and dysphoric mood. The patient is not nervous/anxious.        Objective:    BP 108/58  Pulse 74  Temp(Src) 97.9 F (36.6 C) (Oral)  Ht 5\' 5"  (1.651 m)  Wt 194 lb (87.998 kg)  BMI 32.28 kg/m2  SpO2 96% Physical Exam  Constitutional: She is oriented to person, place, and time. She appears well-developed and well-nourished. No distress.  HENT:  Head: Normocephalic and atraumatic.  Right Ear: External ear normal.  Left Ear: External ear normal.  Nose: Nose normal.  Mouth/Throat: Oropharynx is clear and moist. No oropharyngeal exudate.  Eyes: Conjunctivae are normal. Pupils are equal, round, and reactive to light. Right eye exhibits no discharge. Left eye exhibits no discharge. No scleral icterus.  Neck: Normal range of motion. Neck supple. No tracheal deviation present. No thyromegaly present.  Cardiovascular: Normal rate, regular  rhythm, normal heart sounds and intact distal pulses.  Exam reveals no gallop and no friction rub.   No murmur heard. Pulmonary/Chest: Effort normal and breath sounds normal. No accessory muscle usage. Not tachypneic. No respiratory distress. She has no decreased breath sounds. She has no wheezes. She has no rhonchi. She has no rales. She exhibits no tenderness.  Musculoskeletal: Normal range of motion. She exhibits no edema and no tenderness.  Lymphadenopathy:    She has no cervical adenopathy.  Neurological: She is alert and oriented to person, place, and time. No cranial nerve deficit. She exhibits normal muscle tone. Coordination normal.  Skin: Skin is warm and dry. No rash noted. She is not diaphoretic. No erythema. No pallor.  Psychiatric: She has a normal mood and affect. Her behavior is normal. Judgment and thought content normal.          Assessment & Plan:   Problem List Items Addressed This Visit     Unprioritized   Anxiety and depression     Symptoms improved recently. Will continue to monitor.    Diabetic polyneuropathy associated with type 2 diabetes mellitus     Symptoms of diabetic nerve pain have improved without neurontin. Will continue to monitor.    Relevant Medications      glipiZIDE (GLUCOTROL) tablet   DM (diabetes mellitus), type 2 with complications - Primary      Lab Results  Component Value Date   HGBA1C 9.3* 08/19/2014   BG have been elevated. Will increase Glipizide to 10mg  po bid. Monitor BG. Call if BG<70. Follow up repeat labs in 11/2014.    Relevant Medications  glipiZIDE (GLUCOTROL) tablet   Edema     Stable trace edema bilateral LE. Continue keep legs elevated, limit salt intake.    Hypertension      BP Readings from Last 3 Encounters:  09/29/14 108/58  08/19/14 120/58  05/24/14 145/73   BP well controlled on current medication. Will continue.        Return in about 8 weeks (around 11/24/2014) for Recheck of Diabetes.

## 2014-09-29 NOTE — Assessment & Plan Note (Signed)
Lab Results  Component Value Date   HGBA1C 9.3* 08/19/2014   BG have been elevated. Will increase Glipizide to 10mg  po bid. Monitor BG. Call if BG<70. Follow up repeat labs in 11/2014.

## 2014-09-29 NOTE — Assessment & Plan Note (Signed)
Symptoms improved recently. Will continue to monitor.

## 2014-09-29 NOTE — Assessment & Plan Note (Signed)
Stable trace edema bilateral LE. Continue keep legs elevated, limit salt intake.

## 2014-09-29 NOTE — Patient Instructions (Signed)
Please increase Glipizide to 10mg  twice daily.  Please call immediately if any blood sugars less than 70.  We will repeat labs in 11/2014.

## 2014-09-29 NOTE — Assessment & Plan Note (Signed)
BP Readings from Last 3 Encounters:  09/29/14 108/58  08/19/14 120/58  05/24/14 145/73   BP well controlled on current medication. Will continue.

## 2014-09-29 NOTE — Progress Notes (Signed)
Pre visit review using our clinic review tool, if applicable. No additional management support is needed unless otherwise documented below in the visit note. 

## 2014-10-07 ENCOUNTER — Other Ambulatory Visit: Payer: Self-pay | Admitting: Internal Medicine

## 2014-11-14 ENCOUNTER — Other Ambulatory Visit: Payer: Self-pay | Admitting: Internal Medicine

## 2014-11-15 NOTE — Telephone Encounter (Signed)
Ok refill? 

## 2014-11-17 ENCOUNTER — Other Ambulatory Visit: Payer: Self-pay | Admitting: Internal Medicine

## 2014-11-17 NOTE — Telephone Encounter (Signed)
Last OV 10.15.15, last refill 9.1.15.  Please advise refill

## 2014-11-18 NOTE — Telephone Encounter (Signed)
Rx faxed

## 2014-11-28 ENCOUNTER — Telehealth: Payer: Self-pay | Admitting: Internal Medicine

## 2014-11-28 NOTE — Telephone Encounter (Signed)
I spoke with the pharmacist.  Pharmacist confirmed a valid Rx is on file for #[id number].  Rx will be ready for pick up tomorrow afternoon.  Pt advised.

## 2014-11-28 NOTE — Telephone Encounter (Signed)
Ms. Rocco Serenemick called saying she's completely out of Glipizide. She's wondering if a refill Rx can be [facility]. She has an appt tomorrow but didn't know if the refill can be sent before she comes in. Please call the pt if you have any questions.  Pt ph# [phone] Thank you.

## 2014-11-29 ENCOUNTER — Ambulatory Visit (INDEPENDENT_AMBULATORY_CARE_PROVIDER_SITE_OTHER): Payer: Medicare Other | Admitting: Internal Medicine

## 2014-11-29 ENCOUNTER — Encounter: Payer: Self-pay | Admitting: Internal Medicine

## 2014-11-29 VITALS — BP 116/56 | HR 80 | Temp 97.5°F | Ht 65.0 in | Wt 193.5 lb

## 2014-11-29 DIAGNOSIS — I10 Essential (primary) hypertension: Secondary | ICD-10-CM

## 2014-11-29 DIAGNOSIS — E785 Hyperlipidemia, unspecified: Secondary | ICD-10-CM

## 2014-11-29 DIAGNOSIS — F329 Major depressive disorder, single episode, unspecified: Secondary | ICD-10-CM

## 2014-11-29 DIAGNOSIS — E118 Type 2 diabetes mellitus with unspecified complications: Secondary | ICD-10-CM

## 2014-11-29 DIAGNOSIS — F419 Anxiety disorder, unspecified: Secondary | ICD-10-CM

## 2014-11-29 DIAGNOSIS — F32A Depression, unspecified: Secondary | ICD-10-CM

## 2014-11-29 DIAGNOSIS — F418 Other specified anxiety disorders: Secondary | ICD-10-CM

## 2014-11-29 LAB — HEMOGLOBIN A1C: Hgb A1c MFr Bld: 9.4 % — ABNORMAL HIGH (ref 4.6–6.5)

## 2014-11-29 LAB — COMPREHENSIVE METABOLIC PANEL
ALK PHOS: 108 U/L (ref 39–117)
ALT: 19 U/L (ref 0–35)
AST: 20 U/L (ref 0–37)
Albumin: 3.8 g/dL (ref 3.5–5.2)
BILIRUBIN TOTAL: 0.5 mg/dL (ref 0.2–1.2)
BUN: 13 mg/dL (ref 6–23)
CO2: 20 mEq/L (ref 19–32)
CREATININE: 1 mg/dL (ref 0.4–1.2)
Calcium: 9 mg/dL (ref 8.4–10.5)
Chloride: 103 mEq/L (ref 96–112)
GFR: 59.41 mL/min — AB (ref >60.00)
GLUCOSE: 302 mg/dL — AB (ref 70–99)
Potassium: 3.9 mEq/L (ref 3.5–5.1)
SODIUM: 132 meq/L — AB (ref 135–145)
TOTAL PROTEIN: 7.3 g/dL (ref 6.0–8.3)

## 2014-11-29 LAB — LIPID PANEL
CHOLESTEROL: 142 mg/dL (ref 0–200)
HDL: 35.5 mg/dL — AB (ref >39.00)
NonHDL: 106.5
TRIGLYCERIDES: 209 mg/dL — AB (ref 0.0–149.0)
Total CHOL/HDL Ratio: 4
VLDL: 41.8 mg/dL — AB (ref 0.0–40.0)

## 2014-11-29 LAB — MICROALBUMIN / CREATININE URINE RATIO
Creatinine,U: 20.4 mg/dL
Microalb Creat Ratio: 10.3 mg/g (ref 0.0–30.0)
Microalb, Ur: 2.1 mg/dL — ABNORMAL HIGH (ref 0.0–1.9)

## 2014-11-29 LAB — LDL CHOLESTEROL, DIRECT: Direct LDL: 79.6 mg/dL

## 2014-11-29 NOTE — Assessment & Plan Note (Signed)
Symptoms are much improved compared to previous, with improved contact with her family. Will continue to monitor.

## 2014-11-29 NOTE — Progress Notes (Signed)
Pre visit review using our clinic review tool, if applicable. No additional management support is needed unless otherwise documented below in the visit note. 

## 2014-11-29 NOTE — Assessment & Plan Note (Signed)
Will check labs today. Reviewed instructions for use of Glipizide twice daily. Follow up 3 months and prn.

## 2014-11-29 NOTE — Assessment & Plan Note (Signed)
BP Readings from Last 3 Encounters:  11/29/14 116/56  09/29/14 108/58  08/19/14 120/58   BP well controlled on current medications. Renal function with labs today.

## 2014-11-29 NOTE — Assessment & Plan Note (Signed)
Will check LFTs with labs today. Continue Atorvastatin. 

## 2014-11-29 NOTE — Patient Instructions (Signed)
Labs today.   Follow up in 3 months.  

## 2014-11-29 NOTE — Progress Notes (Signed)
Subjective:    Patient ID: Alexandra English, female    DOB: Aug 17, 1927, 80 y.o.   MRN: [id number]  HPI 80YO female presents for follow up.  DM - Blood sugars ave been near 170s. Did not realize she needed to increase Glipizide, so has been taking once daily.  Feeling well. Her son is coming back to [place] to live and she is happy about this.  Compliant with other medications. No recent falls. [facility].   Past medical, surgical, family and social history per today's encounter.  Review of Systems  Constitutional: Negative for fever, chills, appetite change, fatigue and unexpected weight change.  Eyes: Negative for visual disturbance.  Respiratory: Negative for shortness of breath.   Cardiovascular: Negative for chest pain and leg swelling.  Gastrointestinal: Negative for nausea, vomiting, abdominal pain, diarrhea and constipation.  Musculoskeletal: Negative for myalgias and arthralgias.  Skin: Negative for color change and rash.  Hematological: Negative for adenopathy. Does not bruise/bleed easily.  Psychiatric/Behavioral: Negative for sleep disturbance and dysphoric mood. The patient is not nervous/anxious.        Objective:    BP 116/56 mmHg  Pulse 80  Temp(Src) 97.5 F (36.4 C) (Oral)  Ht 5\' 5"  (1.651 m)  Wt 193 lb 8 oz (87.771 kg)  BMI 32.20 kg/m2  SpO2 96% Physical Exam  Constitutional: She is oriented to person, place, and time. She appears well-developed and well-nourished. No distress.  HENT:  Head: Normocephalic and atraumatic.  Right Ear: External ear normal.  Left Ear: External ear normal.  Nose: Nose normal.  Mouth/Throat: Oropharynx is clear and moist. No oropharyngeal exudate.  Eyes: Conjunctivae are normal. Pupils are equal, round, and reactive to light. Right eye exhibits no discharge. Left eye exhibits no discharge. No scleral icterus.  Neck: Normal range of motion. Neck supple. No tracheal deviation present.  No thyromegaly present.  Cardiovascular: Normal rate, regular rhythm, normal heart sounds and intact distal pulses.  Exam reveals no gallop and no friction rub.   No murmur heard. Pulmonary/Chest: Effort normal and breath sounds normal. No accessory muscle usage. No tachypnea. No respiratory distress. She has no decreased breath sounds. She has no wheezes. She has no rhonchi. She has no rales. She exhibits no tenderness.  Musculoskeletal: Normal range of motion. She exhibits no edema or tenderness.  Lymphadenopathy:    She has no cervical adenopathy.  Neurological: She is alert and oriented to person, place, and time. No cranial nerve deficit. She exhibits normal muscle tone. Coordination normal.  Skin: Skin is warm and dry. No rash noted. She is not diaphoretic. No erythema. No pallor.  Psychiatric: She has a normal mood and affect. Her behavior is normal. Judgment and thought content normal.          Assessment & Plan:   Problem List Items Addressed This Visit      Unprioritized   Anxiety and depression    Symptoms are much improved compared to previous, with improved contact with her family. Will continue to monitor.    DM (diabetes mellitus), type 2 with complications - Primary    Will check labs today. Reviewed instructions for use of Glipizide twice daily. Follow up 3 months and prn.    Relevant Orders      Comprehensive metabolic panel      Hemoglobin A1c      Lipid panel      Microalbumin / creatinine urine ratio   Hyperlipidemia  Will check LFTs with labs today. Continue Atorvastatin.    Hypertension    BP Readings from Last 3 Encounters:  11/29/14 116/56  09/29/14 108/58  08/19/14 120/58   BP well controlled on current medications. Renal function with labs today.        Return in about 3 months (around 02/28/2015) for Recheck of Diabetes.

## 2014-12-20 LAB — HM DIABETES EYE EXAM

## 2014-12-22 DIAGNOSIS — H2513 Age-related nuclear cataract, bilateral: Secondary | ICD-10-CM | POA: Diagnosis not present

## 2014-12-22 DIAGNOSIS — E119 Type 2 diabetes mellitus without complications: Secondary | ICD-10-CM | POA: Diagnosis not present

## 2014-12-22 DIAGNOSIS — H40023 Open angle with borderline findings, high risk, bilateral: Secondary | ICD-10-CM | POA: Diagnosis not present

## 2014-12-22 DIAGNOSIS — H524 Presbyopia: Secondary | ICD-10-CM | POA: Diagnosis not present

## 2015-01-09 ENCOUNTER — Other Ambulatory Visit: Payer: Self-pay | Admitting: Internal Medicine

## 2015-01-10 ENCOUNTER — Other Ambulatory Visit: Payer: Self-pay | Admitting: Internal Medicine

## 2015-03-31 ENCOUNTER — Ambulatory Visit (INDEPENDENT_AMBULATORY_CARE_PROVIDER_SITE_OTHER): Payer: Medicare Other

## 2015-03-31 VITALS — BP 124/62 | HR 88 | Temp 97.6°F | Resp 16 | Ht 65.0 in | Wt 191.0 lb

## 2015-03-31 DIAGNOSIS — Z0000 Encounter for general adult medical examination without abnormal findings: Secondary | ICD-10-CM

## 2015-03-31 NOTE — Patient Instructions (Addendum)
Alexandra English , Thank you for taking time to come for your Medicare Wellness Visit. I appreciate your ongoing commitment to your health goals. Please review the following plan we discussed and let me know if I can assist you in the future.   These are the goals we discussed: Goals    None      This is a list of the screening recommended for you and due dates:  Health Maintenance  Topic Date Due  . Tetanus Vaccine  11/04/1948  . Colon Cancer Screening  11/05/1979  . Shingles Vaccine  11/04/1989  . DEXA scan (bone density measurement)  11/04/1994  . Hemoglobin A1C  05/31/2015  . Flu Shot  07/17/2015  . Complete foot exam   08/20/2015  . Urine Protein Check  11/30/2015  . Eye exam for diabetics  12/21/2015  . Pneumonia vaccines  Completed    Fall Prevention and Home Safety Falls cause injuries and can affect all age groups. It is possible to prevent falls.  HOW TO PREVENT FALLS  Wear shoes with rubber soles that do not have an opening for your toes.  Keep the inside and outside of your house well lit.  Use night lights throughout your home.  Remove clutter from floors.  Clean up floor spills.  Remove throw rugs or fasten them to the floor with carpet tape.  Do not place electrical cords across pathways.  Put grab bars by your tub, shower, and toilet. Do not use towel bars as grab bars.  Put handrails on both sides of the stairway. Fix loose handrails.  Do not climb on stools or stepladders, if possible.  Do not wax your floors.  Repair uneven or unsafe sidewalks, walkways, or stairs.  Keep items you use a lot within reach.  Be aware of pets.  Keep emergency numbers next to the telephone.  Put smoke detectors in your home and near bedrooms. Ask your doctor what other things you can do to prevent falls. Document Released: 09/28/2009 Document Revised: 06/02/2012 Document Reviewed: 03/03/2012 [facility] Patient Information 2015 [place], [place]. This information is not  intended to replace advice given to you by your health care provider. Make sure you discuss any questions you have with your health care provider.  Health Maintenance Adopting a healthy lifestyle and getting preventive care can go a long way to promote health and wellness. Talk with your health care provider about what schedule of regular examinations is right for you. This is a good chance for you to check in with your provider about disease prevention and staying healthy. In between checkups, there are plenty of things you can do on your own. Experts have done a lot of research about which lifestyle changes and preventive measures are most likely to keep you healthy. Ask your health care provider for more information. WEIGHT AND DIET  Eat a healthy diet  Be sure to include plenty of vegetables, fruits, low-fat dairy products, and lean protein.  Do not eat a lot of foods high in solid fats, added sugars, or salt.  Get regular exercise. This is one of the most important things you can do for your health.  Most adults should exercise for at least 150 minutes each week. The exercise should increase your heart rate and make you sweat (moderate-intensity exercise).  Most adults should also do strengthening exercises at least twice a week. This is in addition to the moderate-intensity exercise.  Maintain a healthy weight  Body mass index (BMI) is a  measurement that can be used to identify possible weight problems. It estimates body fat based on height and weight. Your health care provider can help determine your BMI and help you achieve or maintain a healthy weight.  For females 38 years of age and older:   A BMI below 18.5 is considered underweight.  A BMI of 18.5 to 24.9 is normal.  A BMI of 25 to 29.9 is considered overweight.  A BMI of 30 and above is considered obese.  Watch levels of cholesterol and blood lipids  You should start having your blood tested for lipids and cholesterol  at 79 years of age, then have this test every 5 years.  You may need to have your cholesterol levels checked more often if:  Your lipid or cholesterol levels are high.  You are older than 79 years of age.  You are at high risk for heart disease.  CANCER SCREENING   Lung Cancer  Lung cancer screening is recommended for adults 75-24 years old who are at high risk for lung cancer because of a history of smoking.  A yearly low-dose CT scan of the lungs is recommended for people who:  Currently smoke.  Have quit within the past 15 years.  Have at least a 30-pack-year history of smoking. A pack year is smoking an average of one pack of cigarettes a day for 1 year.  Yearly screening should continue until it has been 15 years since you quit.  Yearly screening should stop if you develop a health problem that would prevent you from having lung cancer treatment.  Breast Cancer  Practice breast self-awareness. This means understanding how your breasts normally appear and feel.  It also means doing regular breast self-exams. Let your health care provider know about any changes, no matter how small.  If you are in your 20s or 30s, you should have a clinical breast exam (CBE) by a health care provider every 1-3 years as part of a regular health exam.  If you are 62 or older, have a CBE every year. Also consider having a breast X-ray (mammogram) every year.  If you have a family history of breast cancer, talk to your health care provider about genetic screening.  If you are at high risk for breast cancer, talk to your health care provider about having an MRI and a mammogram every year.  Breast cancer gene (BRCA) assessment is recommended for women who have family members with BRCA-related cancers. BRCA-related cancers include:  Breast.  Ovarian.  Tubal.  Peritoneal cancers.  Results of the assessment will determine the need for genetic counseling and BRCA1 and BRCA2  testing. Cervical Cancer Routine pelvic examinations to screen for cervical cancer are no longer recommended for nonpregnant women who are considered low risk for cancer of the pelvic organs (ovaries, uterus, and vagina) and who do not have symptoms. A pelvic examination may be necessary if you have symptoms including those associated with pelvic infections. Ask your health care provider if a screening pelvic exam is right for you.   The Pap test is the screening test for cervical cancer for women who are considered at risk.  If you had a hysterectomy for a problem that was not cancer or a condition that could lead to cancer, then you no longer need Pap tests.  If you are older than 65 years, and you have had normal Pap tests for the past 10 years, you no longer need to have Pap tests.  If  you have had past treatment for cervical cancer or a condition that could lead to cancer, you need Pap tests and screening for cancer for at least 20 years after your treatment.  If you no longer get a Pap test, assess your risk factors if they change (such as having a new sexual partner). This can affect whether you should start being screened again.  Some women have medical problems that increase their chance of getting cervical cancer. If this is the case for you, your health care provider may recommend more frequent screening and Pap tests.  The human papillomavirus (HPV) test is another test that may be used for cervical cancer screening. The HPV test looks for the virus that can cause cell changes in the cervix. The cells collected during the Pap test can be tested for HPV.  The HPV test can be used to screen women 60 years of age and older. Getting tested for HPV can extend the interval between normal Pap tests from three to five years.  An HPV test also should be used to screen women of any age who have unclear Pap test results.  After 79 years of age, women should have HPV testing as often as Pap  tests.  Colorectal Cancer  This type of cancer can be detected and often prevented.  Routine colorectal cancer screening usually begins at 79 years of age and continues through 79 years of age.  Your health care provider may recommend screening at an earlier age if you have risk factors for colon cancer.  Your health care provider may also recommend using home test kits to check for hidden blood in the stool.  A small camera at the end of a tube can be used to examine your colon directly (sigmoidoscopy or colonoscopy). This is done to check for the earliest forms of colorectal cancer.  Routine screening usually begins at age 69.  Direct examination of the colon should be repeated every 5-10 years through 79 years of age. However, you may need to be screened more often if early forms of precancerous polyps or small growths are found. Skin Cancer  Check your skin from head to toe regularly.  Tell your health care provider about any new moles or changes in moles, especially if there is a change in a mole's shape or color.  Also tell your health care provider if you have a mole that is larger than the size of a pencil eraser.  Always use sunscreen. Apply sunscreen liberally and repeatedly throughout the day.  Protect yourself by wearing long sleeves, pants, a wide-brimmed hat, and sunglasses whenever you are outside. HEART DISEASE, DIABETES, AND HIGH BLOOD PRESSURE   Have your blood pressure checked at least every 1-2 years. High blood pressure causes heart disease and increases the risk of stroke.  If you are between 58 years and 2 years old, ask your health care provider if you should take aspirin to prevent strokes.  Have regular diabetes screenings. This involves taking a blood sample to check your fasting blood sugar level.  If you are at a normal weight and have a low risk for diabetes, have this test once every three years after 79 years of age.  If you are overweight and  have a high risk for diabetes, consider being tested at a younger age or more often. PREVENTING INFECTION  Hepatitis B  If you have a higher risk for hepatitis B, you should be screened for this virus. You are considered at  high risk for hepatitis B if:  You were born in a country where hepatitis B is common. Ask your health care provider which countries are considered high risk.  Your parents were born in a high-risk country, and you have not been immunized against hepatitis B (hepatitis B vaccine).  You have HIV or AIDS.  You use needles to inject street drugs.  You live with someone who has hepatitis B.  You have had sex with someone who has hepatitis B.  You get hemodialysis treatment.  You take certain medicines for conditions, including cancer, organ transplantation, and autoimmune conditions. Hepatitis C  Blood testing is recommended for:  Everyone born from [birth year] through 1963.  Anyone with known risk factors for hepatitis C. Sexually transmitted infections (STIs)  You should be screened for sexually transmitted infections (STIs) including gonorrhea and chlamydia if:  You are sexually active and are younger than 79 years of age.  You are older than 79 years of age and your health care provider tells you that you are at risk for this type of infection.  Your sexual activity has changed since you were last screened and you are at an increased risk for chlamydia or gonorrhea. Ask your health care provider if you are at risk.  If you do not have HIV, but are at risk, it may be recommended that you take a prescription medicine daily to prevent HIV infection. This is called pre-exposure prophylaxis (PrEP). You are considered at risk if:  You are sexually active and do not regularly use condoms or know the HIV status of your partner(s).  You take drugs by injection.  You are sexually active with a partner who has HIV. Talk with your health care provider about whether you  are at high risk of being infected with HIV. If you choose to begin PrEP, you should first be tested for HIV. You should then be tested every 3 months for as long as you are taking PrEP.  PREGNANCY   If you are premenopausal and you may become pregnant, ask your health care provider about preconception counseling.  If you may become pregnant, take 400 to 800 micrograms (mcg) of folic acid every day.  If you want to prevent pregnancy, talk to your health care provider about birth control (contraception). OSTEOPOROSIS AND MENOPAUSE   Osteoporosis is a disease in which the bones lose minerals and strength with aging. This can result in serious bone fractures. Your risk for osteoporosis can be identified using a bone density scan.  If you are 65 years of age or older, or if you are at risk for osteoporosis and fractures, ask your health care provider if you should be screened.  Ask your health care provider whether you should take a calcium or vitamin D supplement to lower your risk for osteoporosis.  Menopause may have certain physical symptoms and risks.  Hormone replacement therapy may reduce some of these symptoms and risks. Talk to your health care provider about whether hormone replacement therapy is right for you.  HOME CARE INSTRUCTIONS   Schedule regular health, dental, and eye exams.  Stay current with your immunizations.   Do not use any tobacco products including cigarettes, chewing tobacco, or electronic cigarettes.  If you are pregnant, do not drink alcohol.  If you are breastfeeding, limit how much and how often you drink alcohol.  Limit alcohol intake to no more than 1 drink per day for nonpregnant women. One drink equals 12 ounces of beer, 5  ounces of wine, or 1 ounces of hard liquor.  Do not use street drugs.  Do not share needles.  Ask your health care provider for help if you need support or information about quitting drugs.  Tell your health care provider if  you often feel depressed.  Tell your health care provider if you have ever been abused or do not feel safe at home. Document Released: 06/17/2011 Document Revised: 04/18/2014 Document Reviewed: 11/03/2013 [facility] Patient Information 2015 [place], [place]. This information is not intended to replace advice given to you by your health care provider. Make sure you discuss any questions you have with your health care provider.

## 2015-03-31 NOTE — Progress Notes (Signed)
Subjective:   Alexandra English is a 81 y.o. female who presents for Medicare Annual (Subsequent) preventive examination.  Review of Systems:   Cardiac Risk Factors include: advanced age (>20mn, >>35women);diabetes mellitus;hypertension     Objective:     Vitals: BP 124/62 mmHg  Pulse 88  Temp(Src) 97.6 F (36.4 C) (Oral)  Resp 16  Ht 5' 5"  (1.651 m)  Wt 191 lb (86.637 kg)  BMI 31.78 kg/m2  SpO2 97%  Tobacco History  Smoking status  . Never Smoker   Smokeless tobacco  . Never Used     Counseling given: Not Answered   Past Medical History  Diagnosis Date  . Thyroid disease     hypothyroidism  . Fracture     of rib  . Glaucoma     Dr. DThomasene Ripple . Sleep apnea     Per Pt. was supposed to start CPAP, however reluctant to try  . Hypertension   . Diabetes mellitus 04/2009    HgbA1c 7.1% 07/2009, Monofilament normal, Ortho pending  . Stroke     Left frontal4/2010, Left parietal and right parietal 04/2011 , Started on Plavix and then changed to Coumadin by recommendation of Dr. CCarlis Abbott  Past Surgical History  Procedure Laterality Date  . Orthopedic surgeon      Dr. DLatanya Maudlin  No family history on file. History  Sexual Activity  . Sexual Activity: Not on file    Outpatient Encounter Prescriptions as of 03/31/2015  Medication Sig  . amLODipine (NORVASC) 5 MG tablet TAKE ONE TABLET BY MOUTH ONCE DAILY  . aspirin 81 MG tablet Take 81 mg by mouth daily.    .Marland Kitchenatorvastatin (LIPITOR) 40 MG tablet Take 1 tablet (40 mg total) by mouth daily.  . Blood Glucose Monitoring Suppl (ONE TOUCH ULTRA SYSTEM KIT) W/DEVICE KIT 1 kit by Does not apply route once.  . cloNIDine (CATAPRES) 0.2 MG tablet TAKE ONE TABLET BY MOUTH AT BEDTIME  . furosemide (LASIX) 20 MG tablet Take 1 tablet (20 mg total) by mouth daily.  .Marland KitchenglipiZIDE (GLUCOTROL) 10 MG tablet Take 1 tablet (10 mg total) by mouth 2 (two) times daily before a meal.  . glucose blood test strip One Touch Use. Check blood sugar  bid  . hydrOXYzine (ATARAX/VISTARIL) 25 MG tablet   . Incontinence Supply Disposable (INCONTINENCE BRIEF LARGE) MISC Use as directed.  . Lancets (ONETOUCH ULTRASOFT) lancets Use as instructed  . levothyroxine (SYNTHROID, LEVOTHROID) 75 MCG tablet TAKE ONE TABLET BY MOUTH ONCE DAILY  . mirtazapine (REMERON) 15 MG tablet TAKE ONE TABLET BY MOUTH AT BEDTIME  . omeprazole (PRILOSEC) 20 MG capsule TAKE ONE CAPSULE BY MOUTH ONCE DAILY  . traMADol (ULTRAM) 50 MG tablet TAKE ONE TABLET BY MOUTH THREE TIMES DAILY AS NEEDED  . [DISCONTINUED] levothyroxine (SYNTHROID, LEVOTHROID) 75 MCG tablet TAKE ONE TABLET BY MOUTH ONCE DAILY    Activities of Daily Living In your present state of health, do you have any difficulty performing the following activities: 03/31/2015 03/31/2015  Hearing? N N  Vision? Y Y  Difficulty concentrating or making decisions? N N  Walking or climbing stairs? Y Y  Dressing or bathing? N N  Doing errands, shopping? YTempie Donning Preparing Food and eating ? N N  Using the Toilet? N N  In the past six months, have you accidently leaked urine? Y Y  Do you have problems with loss of bowel control? N N  Managing your Medications? N N  Managing your Finances? N N  Housekeeping or managing your Housekeeping? N N    Patient Care Team: Jackolyn Confer, MD as PCP - General (Internal Medicine) Thelma Comp, OD (Optometry)    Assessment:     Exercise Activities and Dietary recommendations Current Exercise Habits:: Exercise is limited by, Limited by:: Other - see comments (unsteady gait)  Goals    None     Fall Risk Fall Risk  03/31/2015 01/13/2013  Falls in the past year? Yes No  Number falls in past yr: 1 -  Injury with Fall? No -  Risk for fall due to : Impaired balance/gait -   Depression Screen PHQ 2/9 Scores 03/31/2015 01/13/2013  PHQ - 2 Score 1 0     Cognitive Testing MMSE - Mini Mental State Exam 03/31/2015  Orientation to time 5  Orientation to Place 5    Registration 3  Attention/ Calculation 3  Recall 3  Language- name 2 objects 2  Language- repeat 1  Language- follow 3 step command 3  Language- read & follow direction 1  Write a sentence 1  Copy design 1  Total score 28    Immunization History  Administered Date(s) Administered  . Influenza Split 09/11/2011, 09/04/2012  . Influenza,inj,Quad PF,36+ Mos 08/19/2014  . Pneumococcal Conjugate-13 04/08/2014  . Pneumococcal Polysaccharide-23 04/08/2012   Screening Tests Health Maintenance  Topic Date Due  . TETANUS/TDAP  11/04/1948  . COLONOSCOPY  11/05/1979  . ZOSTAVAX  11/04/1989  . DEXA SCAN  11/04/1994  . HEMOGLOBIN A1C  05/31/2015  . INFLUENZA VACCINE  07/17/2015  . FOOT EXAM  08/20/2015  . URINE MICROALBUMIN  11/30/2015  . OPHTHALMOLOGY EXAM  12/21/2015  . PNA vac Low Risk Adult  Completed      Plan:    During the course of the visit the patient was educated and counseled about the following appropriate screening and preventive services:   Vaccines to include Pneumoccal, Influenza, Hepatitis B, Td, Zostavax, HCV  Electrocardiogram  Cardiovascular Disease  Colorectal cancer screening  Bone density screening  Diabetes screening  Glaucoma screening  Mammography/PAP  Nutrition counseling   Patient Instructions (the written plan) was given to the patient.   Geni Bers, LPN  [id number]

## 2015-03-31 NOTE — Progress Notes (Signed)
Reviewed note. Agree with plan as outlined.

## 2015-04-11 ENCOUNTER — Other Ambulatory Visit: Payer: Self-pay | Admitting: Internal Medicine

## 2015-04-12 ENCOUNTER — Other Ambulatory Visit: Payer: Self-pay

## 2015-04-12 MED ORDER — AMLODIPINE BESYLATE 5 MG PO TABS: 5.0000 mg | ORAL_TABLET | Freq: Every day | ORAL | Status: DC

## 2015-04-12 MED ORDER — CLONIDINE HCL 0.2 MG PO TABS: 0.2000 mg | ORAL_TABLET | Freq: Every day | ORAL | Status: DC

## 2015-04-12 MED ORDER — HYDROXYZINE HCL 25 MG PO TABS
25.0000 mg | ORAL_TABLET | Freq: Every day | ORAL | Status: DC
Start: 2015-04-12 — End: 2015-06-20

## 2015-04-12 MED ORDER — ATORVASTATIN CALCIUM 40 MG PO TABS: 40.0000 mg | ORAL_TABLET | Freq: Every day | ORAL | Status: DC

## 2015-04-12 MED ORDER — MIRTAZAPINE 15 MG PO TABS: 15.0000 mg | ORAL_TABLET | Freq: Every day | ORAL | Status: DC

## 2015-06-20 ENCOUNTER — Telehealth: Payer: Self-pay

## 2015-06-20 ENCOUNTER — Other Ambulatory Visit: Payer: Self-pay | Admitting: Internal Medicine

## 2015-06-20 NOTE — Telephone Encounter (Signed)
The previous message was meant for another pt.  Someone being worked in [employer] account executivetomorrow at [employer]1:30.  If she is having increased pain, needs to be seen before pain medication can be called in.  Need to know what treating.

## 2015-06-20 NOTE — Telephone Encounter (Signed)
As we discussed, she needs to be evaluated.  Need to know what is gong on with pt to know how to treat.

## 2015-06-20 NOTE — Telephone Encounter (Signed)
Spoke with patient, [facility] and is unable to get to the office for an appointment.  Please advise?

## 2015-06-20 NOTE — Telephone Encounter (Signed)
Patient has not seen Dr. Dan HumphreysWalker since 12.15.15 for an annual visit.  Completed her annual wellness exam with our health coach in April.  Please advise in Dr. Tilman NeatWalker's absence? Thanks

## 2015-06-20 NOTE — Telephone Encounter (Signed)
The patient is hoping to get an rx for pain.  She states she has pulled a muscle in her back and is unable to come in for an apt.  Pharmacy - [facility]  Pt callback - [phone]

## 2015-06-20 NOTE — Telephone Encounter (Signed)
See attached note.

## 2015-06-20 NOTE — Telephone Encounter (Signed)
See if pt can come in tomorrow at 1:30 - being worked in.

## 2015-06-21 ENCOUNTER — Other Ambulatory Visit: Payer: Self-pay | Admitting: Internal Medicine

## 2015-06-22 ENCOUNTER — Other Ambulatory Visit: Payer: Self-pay | Admitting: Internal Medicine

## 2015-06-23 NOTE — Telephone Encounter (Signed)
Last OV 12.15.15.  Last refill 6.2.16.  Please advise refill

## 2015-06-27 ENCOUNTER — Ambulatory Visit (INDEPENDENT_AMBULATORY_CARE_PROVIDER_SITE_OTHER): Payer: Medicare Other | Admitting: Internal Medicine

## 2015-06-27 VITALS — BP 130/64 | HR 82 | Temp 98.0°F | Resp 14 | Ht 59.5 in | Wt 189.4 lb

## 2015-06-27 DIAGNOSIS — M549 Dorsalgia, unspecified: Secondary | ICD-10-CM | POA: Insufficient documentation

## 2015-06-27 DIAGNOSIS — R300 Dysuria: Secondary | ICD-10-CM | POA: Diagnosis not present

## 2015-06-27 DIAGNOSIS — E118 Type 2 diabetes mellitus with unspecified complications: Secondary | ICD-10-CM | POA: Diagnosis not present

## 2015-06-27 DIAGNOSIS — F05 Delirium due to known physiological condition: Secondary | ICD-10-CM | POA: Diagnosis not present

## 2015-06-27 DIAGNOSIS — M546 Pain in thoracic spine: Secondary | ICD-10-CM | POA: Diagnosis not present

## 2015-06-27 LAB — POCT URINALYSIS DIPSTICK
Bilirubin, UA: NEGATIVE
Blood, UA: NEGATIVE
Glucose, UA: 100
Ketones, UA: NEGATIVE
Nitrite, UA: NEGATIVE
Protein, UA: NEGATIVE
Urobilinogen, UA: 0.2
pH, UA: 5.5

## 2015-06-27 MED ORDER — CIPROFLOXACIN HCL 250 MG PO TABS: 250.0000 mg | ORAL_TABLET | Freq: Two times a day (BID) | ORAL | Status: DC

## 2015-06-27 NOTE — Assessment & Plan Note (Signed)
Mild confusion noted pt family over last few days. Likely related to UTI. She has supervision at assisted living. Will treat UTI and monitor with follow up prn and in 2 days.

## 2015-06-27 NOTE — Progress Notes (Signed)
Pre visit review using our clinic review tool, if applicable. No additional management support is needed unless otherwise documented below in the visit note. 

## 2015-06-27 NOTE — Assessment & Plan Note (Signed)
Pt has chronic back pain, now worsened in setting of likely UTI. Will continue prn Tramadol. Treat possible UTI. Follow up recheck in 2 days.

## 2015-06-27 NOTE — Assessment & Plan Note (Signed)
Pt has not had A1c check in over 6 months. Will check A1c with labs today.

## 2015-06-27 NOTE — Patient Instructions (Addendum)
Start Cipro 250mg  twice daily to help treat possible urinary tract infection.  Continue Tramadol as needed three times daily for back pain.  We will send labs today and call tomorrow with results.  Follow up at 4pm on Thursday.

## 2015-06-27 NOTE — Progress Notes (Signed)
Subjective:    Patient ID: Alexandra KempBetty L English, female    DOB: [birth year], 81 y.o.   MRN: [id number]  HPI  81YO female presents for acute visit.  Back pain - Left back pain started about 10 days ago. Described as severe, aching. Radiates around left side. No recent trauma or injury noted. Also having some burning with urination. No fever, chills. Taking Tylenol for pain with minimal improvement. Occasionally takes Tramadol with some improvement, esp at night. Notes occasional right leg pain. Continues to use [place] to ambulate. Son is with her today and reports she has appeared more confused recently. [facility].  She has not recently followed up for her DM.   Past medical, surgical, family and social history per today's encounter.  Review of Systems  Constitutional: Positive for fatigue. Negative for fever and chills.  Gastrointestinal: Negative for nausea, vomiting, abdominal pain, diarrhea, constipation and rectal pain.  Genitourinary: Positive for dysuria, frequency and flank pain (left). Negative for urgency, hematuria, decreased urine volume, vaginal bleeding, vaginal discharge, difficulty urinating, vaginal pain and pelvic pain.  Musculoskeletal: Positive for myalgias, back pain and arthralgias.  Psychiatric/Behavioral: Positive for confusion, sleep disturbance and decreased concentration. The patient is not nervous/anxious.        Objective:    BP 130/64 mmHg  Pulse 82  Temp(Src) 98 F (36.7 C)  Resp 14  Ht 4' 11.5" (1.511 m)  Wt 189 lb 6.4 oz (85.911 kg)  BMI 37.63 kg/m2  SpO2 97% Physical Exam  Constitutional: She is oriented to person, place, and time. She appears well-developed and well-nourished. No distress.  HENT:  Head: Normocephalic and atraumatic.  Right Ear: External ear normal.  Left Ear: External ear normal.  Nose: Nose normal.  Mouth/Throat: Oropharynx is clear and moist. No oropharyngeal exudate.  Eyes:  Conjunctivae are normal. Pupils are equal, round, and reactive to light. Right eye exhibits no discharge. Left eye exhibits no discharge. No scleral icterus.  Neck: Normal range of motion. Neck supple. No tracheal deviation present. No thyromegaly present.  Cardiovascular: Normal rate, regular rhythm, normal heart sounds and intact distal pulses.  Exam reveals no gallop and no friction rub.   No murmur heard. Pulmonary/Chest: Effort normal and breath sounds normal. No respiratory distress. She has no wheezes. She has no rales. She exhibits no tenderness.  Abdominal: There is tenderness (left flank).  Musculoskeletal: Normal range of motion. She exhibits no edema.       Thoracic back: She exhibits tenderness and pain.       Back:  Lymphadenopathy:    She has no cervical adenopathy.  Neurological: She is alert and oriented to person, place, and time. No cranial nerve deficit. She exhibits normal muscle tone. Coordination normal.  Skin: Skin is warm and dry. No rash noted. She is not diaphoretic. No erythema. No pallor.  Psychiatric: She has a normal mood and affect. Her behavior is normal. Judgment and thought content normal.          Assessment & Plan:  Over 40min of which >50% spent in face-to-face contact with patient discussing plan of care  Problem List Items Addressed This Visit      Unprioritized   Acute confusional state    Mild confusion noted pt family over last few days. Likely related to UTI. She has supervision at assisted living. Will treat UTI and monitor with follow up prn and in 2 days.      Back pain -  Primary    Pt has chronic back pain, now worsened in setting of likely UTI. Will continue prn Tramadol. Treat possible UTI. Follow up recheck in 2 days.      Relevant Orders   POCT urinalysis dipstick (Completed)   CBC   Comprehensive metabolic panel   DM (diabetes mellitus), type 2 with complications    Pt has not had A1c check in over 6 months. Will check A1c  with labs today.      Relevant Orders   Hemoglobin A1c   Dysuria    Symptoms of dysuria, confusion. UA remarkable only for leukocytes. Will send for culture and start empiric Cipro. Follow up recheck in 2 days and sooner as needed.      Relevant Orders   POCT urinalysis dipstick (Completed)   CULTURE, URINE COMPREHENSIVE       Return in about 2 days (around 06/29/2015) for Recheck.

## 2015-06-27 NOTE — Assessment & Plan Note (Signed)
Symptoms of dysuria, confusion. UA remarkable only for leukocytes. Will send for culture and start empiric Cipro. Follow up recheck in 2 days and sooner as needed.

## 2015-06-28 LAB — COMPREHENSIVE METABOLIC PANEL
ALBUMIN: 4.2 g/dL (ref 3.5–5.2)
ALT: 24 U/L (ref 0–35)
AST: 21 U/L (ref 0–37)
Alkaline Phosphatase: 105 U/L (ref 39–117)
BUN: 13 mg/dL (ref 6–23)
CHLORIDE: 102 meq/L (ref 96–112)
CO2: 19 mEq/L (ref 19–32)
Calcium: 10.1 mg/dL (ref 8.4–10.5)
Creatinine, Ser: 0.94 mg/dL (ref 0.40–1.20)
GFR: 60.06 mL/min (ref >60.00)
GLUCOSE: 207 mg/dL — AB (ref 70–99)
Potassium: 4.7 mEq/L (ref 3.5–5.1)
Sodium: 136 mEq/L (ref 135–145)
Total Bilirubin: 0.3 mg/dL (ref 0.2–1.2)
Total Protein: 8 g/dL (ref 6.0–8.3)

## 2015-06-28 LAB — CBC
HCT: 43.8 % (ref 36.0–46.0)
Hemoglobin: 14.6 g/dL (ref 12.0–15.0)
MCHC: 33.4 g/dL (ref 30.0–36.0)
MCV: 92.3 fl (ref 78.0–100.0)
Platelets: 327 10*3/uL (ref 150.0–400.0)
RBC: 4.75 Mil/uL (ref 3.87–5.11)
RDW: 12.7 % (ref 11.5–15.5)
WBC: 12.6 10*3/uL — AB (ref 4.0–10.5)

## 2015-06-28 LAB — HEMOGLOBIN A1C: HEMOGLOBIN A1C: 8.6 % — AB (ref 4.6–6.5)

## 2015-06-29 ENCOUNTER — Ambulatory Visit: Payer: Medicare Other | Admitting: Internal Medicine

## 2015-07-02 LAB — CULTURE, URINE COMPREHENSIVE: Colony Count: 60000

## 2015-07-03 ENCOUNTER — Ambulatory Visit
Admission: RE | Admit: 2015-07-03 | Discharge: 2015-07-03 | Disposition: A | Discharge status: Home / Self Care | Payer: Medicare Other | Source: Ambulatory Visit | Attending: Internal Medicine | Admitting: Internal Medicine

## 2015-07-03 ENCOUNTER — Encounter: Payer: Self-pay | Admitting: Internal Medicine

## 2015-07-03 ENCOUNTER — Ambulatory Visit (INDEPENDENT_AMBULATORY_CARE_PROVIDER_SITE_OTHER): Payer: Medicare Other | Admitting: Internal Medicine

## 2015-07-03 VITALS — BP 148/78 | HR 90 | Temp 97.9°F | Ht 59.5 in | Wt 191.0 lb

## 2015-07-03 DIAGNOSIS — K5730 Diverticulosis of large intestine without perforation or abscess without bleeding: Secondary | ICD-10-CM | POA: Diagnosis not present

## 2015-07-03 DIAGNOSIS — N281 Cyst of kidney, acquired: Secondary | ICD-10-CM | POA: Insufficient documentation

## 2015-07-03 DIAGNOSIS — R109 Unspecified abdominal pain: Secondary | ICD-10-CM | POA: Insufficient documentation

## 2015-07-03 DIAGNOSIS — R2681 Unsteadiness on feet: Secondary | ICD-10-CM | POA: Diagnosis not present

## 2015-07-03 DIAGNOSIS — F418 Other specified anxiety disorders: Secondary | ICD-10-CM

## 2015-07-03 DIAGNOSIS — F329 Major depressive disorder, single episode, unspecified: Secondary | ICD-10-CM

## 2015-07-03 DIAGNOSIS — K449 Diaphragmatic hernia without obstruction or gangrene: Secondary | ICD-10-CM | POA: Insufficient documentation

## 2015-07-03 DIAGNOSIS — F419 Anxiety disorder, unspecified: Secondary | ICD-10-CM

## 2015-07-03 NOTE — Assessment & Plan Note (Signed)
Left flank pain persistent. Will get CT renal stone evaluation. Complete Cipro for Citrobacter UTI.

## 2015-07-03 NOTE — Progress Notes (Signed)
Pre visit review using our clinic review tool, if applicable. No additional management support is needed unless otherwise documented below in the visit note. 

## 2015-07-03 NOTE — Assessment & Plan Note (Signed)
Gait instability noted by son. Multifactorial with history of stroke and DM with neuropathy. Will set up PT evaluation and training for falls prevention.

## 2015-07-03 NOTE — Assessment & Plan Note (Signed)
Recent worsening anxiety. Offered support today.  Continue Remeron at bedtime.

## 2015-07-03 NOTE — Patient Instructions (Signed)
We will set up CT abdomen to evaluate for a kidney stone.  Follow up in 2 weeks.

## 2015-07-03 NOTE — Progress Notes (Signed)
Subjective:    Patient ID: Alexandra English, female    DOB: Apr 20, 1927, 81 y.o.   MRN: [id number]  HPI  81YO female presents for follow up.  Seen last week for possible UTI. Started on cipro. Urine culture grew low levels of Citrobacter.  Continues to have some left flank pain. However, feels she may have passed a kidney stone on Saturday morning. No further burning with urination. No fever. Feeling anxious.  Son notes that she has gotten weaker. No longer [facility]. Using motorized scooter. Dragging right foot a times. Has history of stroke and diabetic neuropathy which contribute.  Past medical, surgical, family and social history per today's encounter.  Review of Systems  Constitutional: Negative for fever, chills, appetite change, fatigue and unexpected weight change.  Eyes: Negative for visual disturbance.  Respiratory: Negative for shortness of breath.   Cardiovascular: Negative for chest pain and leg swelling.  Gastrointestinal: Negative for abdominal pain.  Genitourinary: Positive for flank pain. Negative for dysuria, urgency and frequency.  Musculoskeletal: Positive for myalgias, arthralgias and gait problem.  Skin: Negative for color change and rash.  Neurological: Positive for speech difficulty (chronic), weakness and numbness. Negative for light-headedness and headaches.  Hematological: Negative for adenopathy. Does not bruise/bleed easily.  Psychiatric/Behavioral: Negative for dysphoric mood. The patient is not nervous/anxious.        Objective:    BP 148/78 mmHg  Pulse 90  Temp(Src) 97.9 F (36.6 C) (Oral)  Ht 4' 11.5" (1.511 m)  Wt 191 lb (86.637 kg)  BMI 37.95 kg/m2  SpO2 100% Physical Exam  Constitutional: She is oriented to person, place, and time. She appears well-developed and well-nourished. No distress.  HENT:  Head: Normocephalic and atraumatic.  Right Ear: External ear normal.  Left Ear: External ear normal.  Nose: Nose  normal.  Mouth/Throat: Oropharynx is clear and moist. No oropharyngeal exudate.  Eyes: Conjunctivae are normal. Pupils are equal, round, and reactive to light. Right eye exhibits no discharge. Left eye exhibits no discharge. No scleral icterus.  Neck: Normal range of motion. Neck supple. No tracheal deviation present. No thyromegaly present.  Cardiovascular: Normal rate, regular rhythm, normal heart sounds and intact distal pulses.  Exam reveals no gallop and no friction rub.   No murmur heard. Pulmonary/Chest: Effort normal and breath sounds normal. No respiratory distress. She has no wheezes. She has no rales. She exhibits no tenderness.  Musculoskeletal: Normal range of motion. She exhibits no edema or tenderness.  Lymphadenopathy:    She has no cervical adenopathy.  Neurological: She is alert and oriented to person, place, and time. She displays no atrophy and no tremor. No cranial nerve deficit or sensory deficit. She exhibits normal muscle tone. Gait abnormal. Coordination normal.  Uses Laconda Basich for gait assistance. Unsteady gait. Drags right foot intermittently  Skin: Skin is warm and dry. No rash noted. She is not diaphoretic. No erythema. No pallor.  Psychiatric: She has a normal mood and affect. Her behavior is normal. Judgment and thought content normal.          Assessment & Plan:   Problem List Items Addressed This Visit      Unprioritized   Anxiety and depression    Recent worsening anxiety. Offered support today.  Continue Remeron at bedtime.      Flank pain - Primary    Left flank pain persistent. Will get CT renal stone evaluation. Complete Cipro for Citrobacter UTI.      Relevant  Orders   CT RENAL STONE STUDY   Gait instability    Gait instability noted by son. Multifactorial with history of stroke and DM with neuropathy. Will set up PT evaluation and training for falls prevention.      Relevant Orders   Ambulatory referral to Physical Therapy       Return  in about 2 weeks (around 07/17/2015) for Recheck.

## 2015-07-11 DIAGNOSIS — R278 Other lack of coordination: Secondary | ICD-10-CM | POA: Diagnosis not present

## 2015-07-11 DIAGNOSIS — M545 Low back pain: Secondary | ICD-10-CM | POA: Diagnosis not present

## 2015-07-11 DIAGNOSIS — R2689 Other abnormalities of gait and mobility: Secondary | ICD-10-CM | POA: Diagnosis not present

## 2015-07-13 ENCOUNTER — Other Ambulatory Visit: Payer: Self-pay | Admitting: Internal Medicine

## 2015-07-14 DIAGNOSIS — R2689 Other abnormalities of gait and mobility: Secondary | ICD-10-CM | POA: Diagnosis not present

## 2015-07-14 DIAGNOSIS — M545 Low back pain: Secondary | ICD-10-CM | POA: Diagnosis not present

## 2015-07-14 DIAGNOSIS — R278 Other lack of coordination: Secondary | ICD-10-CM | POA: Diagnosis not present

## 2015-07-14 NOTE — Telephone Encounter (Signed)
Last TSH 3/15 ok to fill?

## 2015-07-17 DIAGNOSIS — M25512 Pain in left shoulder: Secondary | ICD-10-CM | POA: Diagnosis not present

## 2015-07-17 DIAGNOSIS — M25511 Pain in right shoulder: Secondary | ICD-10-CM | POA: Diagnosis not present

## 2015-07-17 DIAGNOSIS — R2689 Other abnormalities of gait and mobility: Secondary | ICD-10-CM | POA: Diagnosis not present

## 2015-07-17 DIAGNOSIS — R278 Other lack of coordination: Secondary | ICD-10-CM | POA: Diagnosis not present

## 2015-07-17 DIAGNOSIS — M545 Low back pain: Secondary | ICD-10-CM | POA: Diagnosis not present

## 2015-07-19 ENCOUNTER — Ambulatory Visit (INDEPENDENT_AMBULATORY_CARE_PROVIDER_SITE_OTHER): Payer: Medicare Other | Admitting: Internal Medicine

## 2015-07-19 ENCOUNTER — Telehealth: Payer: Self-pay

## 2015-07-19 ENCOUNTER — Encounter: Payer: Self-pay | Admitting: Internal Medicine

## 2015-07-19 VITALS — BP 133/74 | HR 72 | Temp 98.1°F | Ht 59.5 in | Wt 191.2 lb

## 2015-07-19 DIAGNOSIS — F32A Depression, unspecified: Secondary | ICD-10-CM

## 2015-07-19 DIAGNOSIS — R109 Unspecified abdominal pain: Secondary | ICD-10-CM

## 2015-07-19 DIAGNOSIS — F418 Other specified anxiety disorders: Secondary | ICD-10-CM

## 2015-07-19 DIAGNOSIS — F329 Major depressive disorder, single episode, unspecified: Secondary | ICD-10-CM

## 2015-07-19 DIAGNOSIS — F419 Anxiety disorder, unspecified: Secondary | ICD-10-CM

## 2015-07-19 DIAGNOSIS — R300 Dysuria: Secondary | ICD-10-CM | POA: Diagnosis not present

## 2015-07-19 DIAGNOSIS — R2681 Unsteadiness on feet: Secondary | ICD-10-CM | POA: Diagnosis not present

## 2015-07-19 LAB — POCT URINALYSIS DIPSTICK
Bilirubin, UA: NEGATIVE
Glucose, UA: NEGATIVE
KETONES UA: NEGATIVE
Nitrite, UA: NEGATIVE
Protein, UA: NEGATIVE
SPEC GRAV UA: 1.015
UROBILINOGEN UA: 0.2
pH, UA: 5.5

## 2015-07-19 LAB — URINALYSIS, ROUTINE W REFLEX MICROSCOPIC
BILIRUBIN URINE: NEGATIVE
Ketones, ur: NEGATIVE
Nitrite: NEGATIVE
Specific Gravity, Urine: <=1.005 — AB (ref 1.000–1.030)
Total Protein, Urine: NEGATIVE
URINE GLUCOSE: NEGATIVE
Urobilinogen, UA: 0.2 (ref 0.0–1.0)
pH: 6 (ref 5.0–8.0)

## 2015-07-19 MED ORDER — LIDOCAINE 5 % EX PTCH: 1.0000 | MEDICATED_PATCH | CUTANEOUS | Status: DC

## 2015-07-19 MED ORDER — MIRTAZAPINE 30 MG PO TABS: 15.0000 mg | ORAL_TABLET | Freq: Every day | ORAL | Status: DC

## 2015-07-19 NOTE — Progress Notes (Signed)
Subjective:    Patient ID: Alexandra English, female    DOB: November 24, 1927, 81 y.o.   MRN: [id number]  HPI  81YO female presents for follow up.  Last seen 7/18 with flank pain. CT renal stone protocol showed no acute findings. Continues to have left flank pain/low back pain. Undergoing PT with some improvement. Using Tramadol daily with some improvement. Notes some agitation with taking Tramadol.  Not sleeping well. Goes to bed 11pm and wakes 2-3 am.  DM - recent A1c was elevated 8.6%. Notes increased intake of ice cream and sweetened beverages.    Past medical, surgical, family and social history per today's encounter.   Review of Systems  Constitutional: Negative for fever, chills, appetite change, fatigue and unexpected weight change.  Eyes: Negative for visual disturbance.  Respiratory: Negative for shortness of breath.   Cardiovascular: Negative for chest pain and leg swelling.  Gastrointestinal: Negative for nausea, vomiting, abdominal pain, diarrhea and constipation.  Genitourinary: Positive for flank pain. Negative for dysuria, urgency, frequency and hematuria.  Musculoskeletal: Positive for myalgias, back pain and arthralgias.  Skin: Negative for color change and rash.  Hematological: Negative for adenopathy. Does not bruise/bleed easily.  Psychiatric/Behavioral: Positive for sleep disturbance and dysphoric mood. Negative for suicidal ideas. The patient is nervous/anxious.        Objective:    BP 133/74 mmHg  Pulse 72  Temp(Src) 98.1 F (36.7 C) (Oral)  Ht 4' 11.5" (1.511 m)  Wt 191 lb 4 oz (86.75 kg)  BMI 38.00 kg/m2  SpO2 93% Physical Exam  Constitutional: She is oriented to person, place, and time. She appears well-developed and well-nourished. No distress.  HENT:  Head: Normocephalic and atraumatic.  Right Ear: External ear normal.  Left Ear: External ear normal.  Nose: Nose normal.  Mouth/Throat: Oropharynx is clear and moist. No oropharyngeal exudate.    Eyes: Conjunctivae are normal. Pupils are equal, round, and reactive to light. Right eye exhibits no discharge. Left eye exhibits no discharge. No scleral icterus.  Neck: Normal range of motion. Neck supple. No tracheal deviation present. No thyromegaly present.  Cardiovascular: Normal rate, regular rhythm, normal heart sounds and intact distal pulses.  Exam reveals no gallop and no friction rub.   No murmur heard. Pulmonary/Chest: Effort normal and breath sounds normal. No respiratory distress. She has no wheezes. She has no rales. She exhibits no tenderness.  Musculoskeletal: Normal range of motion. She exhibits no edema or tenderness.  Lymphadenopathy:    She has no cervical adenopathy.  Neurological: She is alert and oriented to person, place, and time. No cranial nerve deficit. She exhibits normal muscle tone. Coordination normal.  Skin: Skin is warm and dry. No rash noted. She is not diaphoretic. No erythema. No pallor.  Psychiatric: Her behavior is normal. Judgment and thought content normal. Her mood appears anxious. Her speech is slurred (chronic mild slurring after stroke). Cognition and memory are normal.          Assessment & Plan:   Problem List Items Addressed This Visit      Unprioritized   Anxiety and depression    Recent increased anxiety. Will increase Remeron to  daily at bedtime. Follow up in 4 weeks and prn.      Relevant Medications   mirtazapine (REMERON) 30 MG tablet   Dysuria    Repeat UA pos for leuk. Will resend urine culture. Last culture showed infection sensitive to Cipro.      Relevant Orders   Urinalysis,  Routine w reflex microscopic   Urine Culture   Flank pain - Primary    Left flank/lower back pain. CT abdomen was normal. Pain most likely related to OA of lumbar spine and spasm of paraspinal muscles. Will continue PT and prn Tramadol. Add topical Lidoderm. Follow up 4 weeks and prn.      Relevant Medications   lidocaine (LIDODERM) 5 %    Other Relevant Orders   POCT Urinalysis Dipstick (Completed)   Urinalysis, Routine w reflex microscopic   Urine Culture   Gait instability    Gait instability after stroke. Will set up podiatry referral for inserts and diabetic shoes to see if this helps with stability. Continue PT. Follow up in 4 weeks.      Relevant Orders   Ambulatory referral to Podiatry       Return in about 4 weeks (around 08/16/2015) for Recheck.

## 2015-07-19 NOTE — Telephone Encounter (Signed)
Started PA process for Lidoderm 5% patches.  Awaiting results from [facility]

## 2015-07-19 NOTE — Assessment & Plan Note (Signed)
Left flank/lower back pain. CT abdomen was normal. Pain most likely related to OA of lumbar spine and spasm of paraspinal muscles. Will continue PT and prn Tramadol. Add topical Lidoderm. Follow up 4 weeks and prn.

## 2015-07-19 NOTE — Assessment & Plan Note (Addendum)
Gait instability after stroke. Will set up podiatry referral for inserts and diabetic shoes to see if this helps with stability. Continue PT. Follow up in 4 weeks.

## 2015-07-19 NOTE — Assessment & Plan Note (Signed)
Repeat UA pos for leuk. Will resend urine culture. Last culture showed infection sensitive to Cipro.

## 2015-07-19 NOTE — Patient Instructions (Addendum)
Increase Remeron (Mirtazipine) to  at bedtime.  We will set up evaluation with Dr. Orland Jarred in Podiatry.  Start Lidoderm patch to lower back to help with pain. Apply patch for 12 hours, then remove for 12 hours.  Check blood sugar 1-2 times daily and bring readings to next visit.

## 2015-07-19 NOTE — Progress Notes (Signed)
Pre visit review using our clinic review tool, if applicable. No additional management support is needed unless otherwise documented below in the visit note. 

## 2015-07-19 NOTE — Assessment & Plan Note (Signed)
Recent increased anxiety. Will increase Remeron to  daily at bedtime. Follow up in 4 weeks and prn.

## 2015-07-20 DIAGNOSIS — R2689 Other abnormalities of gait and mobility: Secondary | ICD-10-CM | POA: Diagnosis not present

## 2015-07-20 DIAGNOSIS — M25511 Pain in right shoulder: Secondary | ICD-10-CM | POA: Diagnosis not present

## 2015-07-20 DIAGNOSIS — M545 Low back pain: Secondary | ICD-10-CM | POA: Diagnosis not present

## 2015-07-20 DIAGNOSIS — M25512 Pain in left shoulder: Secondary | ICD-10-CM | POA: Diagnosis not present

## 2015-07-20 DIAGNOSIS — R278 Other lack of coordination: Secondary | ICD-10-CM | POA: Diagnosis not present

## 2015-07-21 LAB — URINE CULTURE

## 2015-07-24 ENCOUNTER — Telehealth: Payer: Self-pay | Admitting: Internal Medicine

## 2015-07-24 ENCOUNTER — Other Ambulatory Visit: Payer: Self-pay | Admitting: *Deleted

## 2015-07-24 DIAGNOSIS — M25512 Pain in left shoulder: Secondary | ICD-10-CM | POA: Diagnosis not present

## 2015-07-24 DIAGNOSIS — R2689 Other abnormalities of gait and mobility: Secondary | ICD-10-CM | POA: Diagnosis not present

## 2015-07-24 DIAGNOSIS — M545 Low back pain: Secondary | ICD-10-CM | POA: Diagnosis not present

## 2015-07-24 DIAGNOSIS — R278 Other lack of coordination: Secondary | ICD-10-CM | POA: Diagnosis not present

## 2015-07-24 DIAGNOSIS — M25511 Pain in right shoulder: Secondary | ICD-10-CM | POA: Diagnosis not present

## 2015-07-24 MED ORDER — CIPROFLOXACIN HCL 500 MG PO TABS: 500.0000 mg | ORAL_TABLET | Freq: Two times a day (BID) | ORAL | Status: DC

## 2015-07-24 NOTE — Telephone Encounter (Signed)
Patient left voice mail wanting test results.

## 2015-07-24 NOTE — Telephone Encounter (Signed)
Spoke with pt, advised of lab results and sent Rx to [facility] as requested

## 2015-07-27 DIAGNOSIS — M25511 Pain in right shoulder: Secondary | ICD-10-CM | POA: Diagnosis not present

## 2015-07-27 DIAGNOSIS — M545 Low back pain: Secondary | ICD-10-CM | POA: Diagnosis not present

## 2015-07-27 DIAGNOSIS — R2689 Other abnormalities of gait and mobility: Secondary | ICD-10-CM | POA: Diagnosis not present

## 2015-07-27 DIAGNOSIS — R278 Other lack of coordination: Secondary | ICD-10-CM | POA: Diagnosis not present

## 2015-07-27 DIAGNOSIS — M25512 Pain in left shoulder: Secondary | ICD-10-CM | POA: Diagnosis not present

## 2015-07-28 DIAGNOSIS — M25512 Pain in left shoulder: Secondary | ICD-10-CM | POA: Diagnosis not present

## 2015-07-31 ENCOUNTER — Other Ambulatory Visit: Payer: Self-pay | Admitting: Internal Medicine

## 2015-07-31 DIAGNOSIS — R278 Other lack of coordination: Secondary | ICD-10-CM | POA: Diagnosis not present

## 2015-07-31 DIAGNOSIS — M25511 Pain in right shoulder: Secondary | ICD-10-CM | POA: Diagnosis not present

## 2015-07-31 DIAGNOSIS — R2689 Other abnormalities of gait and mobility: Secondary | ICD-10-CM | POA: Diagnosis not present

## 2015-07-31 DIAGNOSIS — M545 Low back pain: Secondary | ICD-10-CM | POA: Diagnosis not present

## 2015-07-31 DIAGNOSIS — M25512 Pain in left shoulder: Secondary | ICD-10-CM | POA: Diagnosis not present

## 2015-08-01 DIAGNOSIS — M25511 Pain in right shoulder: Secondary | ICD-10-CM | POA: Diagnosis not present

## 2015-08-01 DIAGNOSIS — M25512 Pain in left shoulder: Secondary | ICD-10-CM | POA: Diagnosis not present

## 2015-08-01 DIAGNOSIS — R2689 Other abnormalities of gait and mobility: Secondary | ICD-10-CM | POA: Diagnosis not present

## 2015-08-01 DIAGNOSIS — M545 Low back pain: Secondary | ICD-10-CM | POA: Diagnosis not present

## 2015-08-01 DIAGNOSIS — R278 Other lack of coordination: Secondary | ICD-10-CM | POA: Diagnosis not present

## 2015-08-02 DIAGNOSIS — R2689 Other abnormalities of gait and mobility: Secondary | ICD-10-CM | POA: Diagnosis not present

## 2015-08-02 DIAGNOSIS — M25511 Pain in right shoulder: Secondary | ICD-10-CM | POA: Diagnosis not present

## 2015-08-02 DIAGNOSIS — M25512 Pain in left shoulder: Secondary | ICD-10-CM | POA: Diagnosis not present

## 2015-08-02 DIAGNOSIS — M545 Low back pain: Secondary | ICD-10-CM | POA: Diagnosis not present

## 2015-08-02 DIAGNOSIS — R278 Other lack of coordination: Secondary | ICD-10-CM | POA: Diagnosis not present

## 2015-08-04 DIAGNOSIS — M545 Low back pain: Secondary | ICD-10-CM | POA: Diagnosis not present

## 2015-08-04 DIAGNOSIS — R278 Other lack of coordination: Secondary | ICD-10-CM | POA: Diagnosis not present

## 2015-08-04 DIAGNOSIS — M25511 Pain in right shoulder: Secondary | ICD-10-CM | POA: Diagnosis not present

## 2015-08-04 DIAGNOSIS — M25512 Pain in left shoulder: Secondary | ICD-10-CM | POA: Diagnosis not present

## 2015-08-04 DIAGNOSIS — R2689 Other abnormalities of gait and mobility: Secondary | ICD-10-CM | POA: Diagnosis not present

## 2015-08-04 NOTE — Telephone Encounter (Signed)
We will have to discuss other options next visit.

## 2015-08-04 NOTE — Telephone Encounter (Signed)
Denied on August 4th. Please advise?

## 2015-08-07 DIAGNOSIS — M25511 Pain in right shoulder: Secondary | ICD-10-CM | POA: Diagnosis not present

## 2015-08-07 DIAGNOSIS — M545 Low back pain: Secondary | ICD-10-CM | POA: Diagnosis not present

## 2015-08-07 DIAGNOSIS — M25512 Pain in left shoulder: Secondary | ICD-10-CM | POA: Diagnosis not present

## 2015-08-07 DIAGNOSIS — R278 Other lack of coordination: Secondary | ICD-10-CM | POA: Diagnosis not present

## 2015-08-07 DIAGNOSIS — R2689 Other abnormalities of gait and mobility: Secondary | ICD-10-CM | POA: Diagnosis not present

## 2015-08-08 DIAGNOSIS — M545 Low back pain: Secondary | ICD-10-CM | POA: Diagnosis not present

## 2015-08-08 DIAGNOSIS — M25512 Pain in left shoulder: Secondary | ICD-10-CM | POA: Diagnosis not present

## 2015-08-08 DIAGNOSIS — R278 Other lack of coordination: Secondary | ICD-10-CM | POA: Diagnosis not present

## 2015-08-08 DIAGNOSIS — R2689 Other abnormalities of gait and mobility: Secondary | ICD-10-CM | POA: Diagnosis not present

## 2015-08-08 DIAGNOSIS — M25511 Pain in right shoulder: Secondary | ICD-10-CM | POA: Diagnosis not present

## 2015-08-09 DIAGNOSIS — M545 Low back pain: Secondary | ICD-10-CM | POA: Diagnosis not present

## 2015-08-09 DIAGNOSIS — R278 Other lack of coordination: Secondary | ICD-10-CM | POA: Diagnosis not present

## 2015-08-09 DIAGNOSIS — M25512 Pain in left shoulder: Secondary | ICD-10-CM | POA: Diagnosis not present

## 2015-08-09 DIAGNOSIS — R2689 Other abnormalities of gait and mobility: Secondary | ICD-10-CM | POA: Diagnosis not present

## 2015-08-09 DIAGNOSIS — M25511 Pain in right shoulder: Secondary | ICD-10-CM | POA: Diagnosis not present

## 2015-08-10 DIAGNOSIS — M25511 Pain in right shoulder: Secondary | ICD-10-CM | POA: Diagnosis not present

## 2015-08-10 DIAGNOSIS — M25512 Pain in left shoulder: Secondary | ICD-10-CM | POA: Diagnosis not present

## 2015-08-10 DIAGNOSIS — Q6689 Other  specified congenital deformities of feet: Secondary | ICD-10-CM | POA: Diagnosis not present

## 2015-08-10 DIAGNOSIS — M545 Low back pain: Secondary | ICD-10-CM | POA: Diagnosis not present

## 2015-08-10 DIAGNOSIS — R278 Other lack of coordination: Secondary | ICD-10-CM | POA: Diagnosis not present

## 2015-08-10 DIAGNOSIS — L851 Acquired keratosis [keratoderma] palmaris et plantaris: Secondary | ICD-10-CM | POA: Diagnosis not present

## 2015-08-10 DIAGNOSIS — R2689 Other abnormalities of gait and mobility: Secondary | ICD-10-CM | POA: Diagnosis not present

## 2015-08-10 DIAGNOSIS — E1140 Type 2 diabetes mellitus with diabetic neuropathy, unspecified: Secondary | ICD-10-CM | POA: Diagnosis not present

## 2015-08-14 DIAGNOSIS — R278 Other lack of coordination: Secondary | ICD-10-CM | POA: Diagnosis not present

## 2015-08-14 DIAGNOSIS — M545 Low back pain: Secondary | ICD-10-CM | POA: Diagnosis not present

## 2015-08-14 DIAGNOSIS — R2689 Other abnormalities of gait and mobility: Secondary | ICD-10-CM | POA: Diagnosis not present

## 2015-08-14 DIAGNOSIS — M25512 Pain in left shoulder: Secondary | ICD-10-CM | POA: Diagnosis not present

## 2015-08-14 DIAGNOSIS — M25511 Pain in right shoulder: Secondary | ICD-10-CM | POA: Diagnosis not present

## 2015-08-15 DIAGNOSIS — R278 Other lack of coordination: Secondary | ICD-10-CM | POA: Diagnosis not present

## 2015-08-15 DIAGNOSIS — M25512 Pain in left shoulder: Secondary | ICD-10-CM | POA: Diagnosis not present

## 2015-08-15 DIAGNOSIS — M25511 Pain in right shoulder: Secondary | ICD-10-CM | POA: Diagnosis not present

## 2015-08-15 DIAGNOSIS — M545 Low back pain: Secondary | ICD-10-CM | POA: Diagnosis not present

## 2015-08-15 DIAGNOSIS — R2689 Other abnormalities of gait and mobility: Secondary | ICD-10-CM | POA: Diagnosis not present

## 2015-08-16 DIAGNOSIS — M545 Low back pain: Secondary | ICD-10-CM | POA: Diagnosis not present

## 2015-08-16 DIAGNOSIS — M25512 Pain in left shoulder: Secondary | ICD-10-CM | POA: Diagnosis not present

## 2015-08-16 DIAGNOSIS — M25511 Pain in right shoulder: Secondary | ICD-10-CM | POA: Diagnosis not present

## 2015-08-16 DIAGNOSIS — R2689 Other abnormalities of gait and mobility: Secondary | ICD-10-CM | POA: Diagnosis not present

## 2015-08-16 DIAGNOSIS — R278 Other lack of coordination: Secondary | ICD-10-CM | POA: Diagnosis not present

## 2015-08-17 DIAGNOSIS — M545 Low back pain: Secondary | ICD-10-CM | POA: Diagnosis not present

## 2015-08-17 DIAGNOSIS — R2689 Other abnormalities of gait and mobility: Secondary | ICD-10-CM | POA: Diagnosis not present

## 2015-08-17 DIAGNOSIS — M25511 Pain in right shoulder: Secondary | ICD-10-CM | POA: Diagnosis not present

## 2015-08-17 DIAGNOSIS — R278 Other lack of coordination: Secondary | ICD-10-CM | POA: Diagnosis not present

## 2015-08-17 DIAGNOSIS — M25512 Pain in left shoulder: Secondary | ICD-10-CM | POA: Diagnosis not present

## 2015-08-21 ENCOUNTER — Other Ambulatory Visit: Payer: Self-pay | Admitting: Internal Medicine

## 2015-08-22 DIAGNOSIS — M545 Low back pain: Secondary | ICD-10-CM | POA: Diagnosis not present

## 2015-08-22 DIAGNOSIS — M25512 Pain in left shoulder: Secondary | ICD-10-CM | POA: Diagnosis not present

## 2015-08-22 DIAGNOSIS — M25511 Pain in right shoulder: Secondary | ICD-10-CM | POA: Diagnosis not present

## 2015-08-22 DIAGNOSIS — R2689 Other abnormalities of gait and mobility: Secondary | ICD-10-CM | POA: Diagnosis not present

## 2015-08-22 DIAGNOSIS — R278 Other lack of coordination: Secondary | ICD-10-CM | POA: Diagnosis not present

## 2015-08-23 ENCOUNTER — Other Ambulatory Visit: Payer: Self-pay | Admitting: Internal Medicine

## 2015-08-23 DIAGNOSIS — M25512 Pain in left shoulder: Secondary | ICD-10-CM | POA: Diagnosis not present

## 2015-08-23 DIAGNOSIS — R2689 Other abnormalities of gait and mobility: Secondary | ICD-10-CM | POA: Diagnosis not present

## 2015-08-23 DIAGNOSIS — M545 Low back pain: Secondary | ICD-10-CM | POA: Diagnosis not present

## 2015-08-23 DIAGNOSIS — M25511 Pain in right shoulder: Secondary | ICD-10-CM | POA: Diagnosis not present

## 2015-08-23 DIAGNOSIS — R278 Other lack of coordination: Secondary | ICD-10-CM | POA: Diagnosis not present

## 2015-08-24 DIAGNOSIS — R278 Other lack of coordination: Secondary | ICD-10-CM | POA: Diagnosis not present

## 2015-08-24 DIAGNOSIS — R2689 Other abnormalities of gait and mobility: Secondary | ICD-10-CM | POA: Diagnosis not present

## 2015-08-24 DIAGNOSIS — M545 Low back pain: Secondary | ICD-10-CM | POA: Diagnosis not present

## 2015-08-24 DIAGNOSIS — M25512 Pain in left shoulder: Secondary | ICD-10-CM | POA: Diagnosis not present

## 2015-08-24 DIAGNOSIS — M25511 Pain in right shoulder: Secondary | ICD-10-CM | POA: Diagnosis not present

## 2015-08-25 DIAGNOSIS — R2689 Other abnormalities of gait and mobility: Secondary | ICD-10-CM | POA: Diagnosis not present

## 2015-08-25 DIAGNOSIS — R278 Other lack of coordination: Secondary | ICD-10-CM | POA: Diagnosis not present

## 2015-08-25 DIAGNOSIS — M545 Low back pain: Secondary | ICD-10-CM | POA: Diagnosis not present

## 2015-08-25 DIAGNOSIS — M25511 Pain in right shoulder: Secondary | ICD-10-CM | POA: Diagnosis not present

## 2015-08-25 DIAGNOSIS — M25512 Pain in left shoulder: Secondary | ICD-10-CM | POA: Diagnosis not present

## 2015-08-28 DIAGNOSIS — M25511 Pain in right shoulder: Secondary | ICD-10-CM | POA: Diagnosis not present

## 2015-08-28 DIAGNOSIS — M25512 Pain in left shoulder: Secondary | ICD-10-CM | POA: Diagnosis not present

## 2015-08-28 DIAGNOSIS — R278 Other lack of coordination: Secondary | ICD-10-CM | POA: Diagnosis not present

## 2015-08-28 DIAGNOSIS — R2689 Other abnormalities of gait and mobility: Secondary | ICD-10-CM | POA: Diagnosis not present

## 2015-08-28 DIAGNOSIS — M545 Low back pain: Secondary | ICD-10-CM | POA: Diagnosis not present

## 2015-08-29 DIAGNOSIS — M25512 Pain in left shoulder: Secondary | ICD-10-CM | POA: Diagnosis not present

## 2015-08-29 DIAGNOSIS — M25511 Pain in right shoulder: Secondary | ICD-10-CM | POA: Diagnosis not present

## 2015-08-29 DIAGNOSIS — R278 Other lack of coordination: Secondary | ICD-10-CM | POA: Diagnosis not present

## 2015-08-29 DIAGNOSIS — R2689 Other abnormalities of gait and mobility: Secondary | ICD-10-CM | POA: Diagnosis not present

## 2015-08-29 DIAGNOSIS — M545 Low back pain: Secondary | ICD-10-CM | POA: Diagnosis not present

## 2015-08-30 DIAGNOSIS — R2689 Other abnormalities of gait and mobility: Secondary | ICD-10-CM | POA: Diagnosis not present

## 2015-08-30 DIAGNOSIS — M25511 Pain in right shoulder: Secondary | ICD-10-CM | POA: Diagnosis not present

## 2015-08-30 DIAGNOSIS — M545 Low back pain: Secondary | ICD-10-CM | POA: Diagnosis not present

## 2015-08-30 DIAGNOSIS — M25512 Pain in left shoulder: Secondary | ICD-10-CM | POA: Diagnosis not present

## 2015-08-30 DIAGNOSIS — R278 Other lack of coordination: Secondary | ICD-10-CM | POA: Diagnosis not present

## 2015-08-31 DIAGNOSIS — M545 Low back pain: Secondary | ICD-10-CM | POA: Diagnosis not present

## 2015-08-31 DIAGNOSIS — R2689 Other abnormalities of gait and mobility: Secondary | ICD-10-CM | POA: Diagnosis not present

## 2015-08-31 DIAGNOSIS — R278 Other lack of coordination: Secondary | ICD-10-CM | POA: Diagnosis not present

## 2015-08-31 DIAGNOSIS — M25511 Pain in right shoulder: Secondary | ICD-10-CM | POA: Diagnosis not present

## 2015-08-31 DIAGNOSIS — M25512 Pain in left shoulder: Secondary | ICD-10-CM | POA: Diagnosis not present

## 2015-09-04 DIAGNOSIS — R278 Other lack of coordination: Secondary | ICD-10-CM | POA: Diagnosis not present

## 2015-09-04 DIAGNOSIS — M25511 Pain in right shoulder: Secondary | ICD-10-CM | POA: Diagnosis not present

## 2015-09-04 DIAGNOSIS — R2689 Other abnormalities of gait and mobility: Secondary | ICD-10-CM | POA: Diagnosis not present

## 2015-09-04 DIAGNOSIS — M25512 Pain in left shoulder: Secondary | ICD-10-CM | POA: Diagnosis not present

## 2015-09-04 DIAGNOSIS — M545 Low back pain: Secondary | ICD-10-CM | POA: Diagnosis not present

## 2015-09-05 DIAGNOSIS — R2689 Other abnormalities of gait and mobility: Secondary | ICD-10-CM | POA: Diagnosis not present

## 2015-09-05 DIAGNOSIS — M25511 Pain in right shoulder: Secondary | ICD-10-CM | POA: Diagnosis not present

## 2015-09-05 DIAGNOSIS — R278 Other lack of coordination: Secondary | ICD-10-CM | POA: Diagnosis not present

## 2015-09-05 DIAGNOSIS — M25512 Pain in left shoulder: Secondary | ICD-10-CM | POA: Diagnosis not present

## 2015-09-05 DIAGNOSIS — M545 Low back pain: Secondary | ICD-10-CM | POA: Diagnosis not present

## 2015-09-06 DIAGNOSIS — M25511 Pain in right shoulder: Secondary | ICD-10-CM | POA: Diagnosis not present

## 2015-09-06 DIAGNOSIS — M545 Low back pain: Secondary | ICD-10-CM | POA: Diagnosis not present

## 2015-09-06 DIAGNOSIS — M25512 Pain in left shoulder: Secondary | ICD-10-CM | POA: Diagnosis not present

## 2015-09-06 DIAGNOSIS — R2689 Other abnormalities of gait and mobility: Secondary | ICD-10-CM | POA: Diagnosis not present

## 2015-09-06 DIAGNOSIS — R278 Other lack of coordination: Secondary | ICD-10-CM | POA: Diagnosis not present

## 2015-09-07 DIAGNOSIS — M545 Low back pain: Secondary | ICD-10-CM | POA: Diagnosis not present

## 2015-09-07 DIAGNOSIS — M25511 Pain in right shoulder: Secondary | ICD-10-CM | POA: Diagnosis not present

## 2015-09-07 DIAGNOSIS — R2689 Other abnormalities of gait and mobility: Secondary | ICD-10-CM | POA: Diagnosis not present

## 2015-09-07 DIAGNOSIS — R278 Other lack of coordination: Secondary | ICD-10-CM | POA: Diagnosis not present

## 2015-09-07 DIAGNOSIS — M25512 Pain in left shoulder: Secondary | ICD-10-CM | POA: Diagnosis not present

## 2015-09-11 DIAGNOSIS — M545 Low back pain: Secondary | ICD-10-CM | POA: Diagnosis not present

## 2015-09-11 DIAGNOSIS — R2689 Other abnormalities of gait and mobility: Secondary | ICD-10-CM | POA: Diagnosis not present

## 2015-09-11 DIAGNOSIS — M25511 Pain in right shoulder: Secondary | ICD-10-CM | POA: Diagnosis not present

## 2015-09-11 DIAGNOSIS — R278 Other lack of coordination: Secondary | ICD-10-CM | POA: Diagnosis not present

## 2015-09-11 DIAGNOSIS — M25512 Pain in left shoulder: Secondary | ICD-10-CM | POA: Diagnosis not present

## 2015-09-12 DIAGNOSIS — M25512 Pain in left shoulder: Secondary | ICD-10-CM | POA: Diagnosis not present

## 2015-09-12 DIAGNOSIS — M25511 Pain in right shoulder: Secondary | ICD-10-CM | POA: Diagnosis not present

## 2015-09-12 DIAGNOSIS — R2689 Other abnormalities of gait and mobility: Secondary | ICD-10-CM | POA: Diagnosis not present

## 2015-09-12 DIAGNOSIS — M545 Low back pain: Secondary | ICD-10-CM | POA: Diagnosis not present

## 2015-09-12 DIAGNOSIS — R278 Other lack of coordination: Secondary | ICD-10-CM | POA: Diagnosis not present

## 2015-09-14 DIAGNOSIS — M545 Low back pain: Secondary | ICD-10-CM | POA: Diagnosis not present

## 2015-09-14 DIAGNOSIS — M25512 Pain in left shoulder: Secondary | ICD-10-CM | POA: Diagnosis not present

## 2015-09-14 DIAGNOSIS — R278 Other lack of coordination: Secondary | ICD-10-CM | POA: Diagnosis not present

## 2015-09-14 DIAGNOSIS — R2689 Other abnormalities of gait and mobility: Secondary | ICD-10-CM | POA: Diagnosis not present

## 2015-09-14 DIAGNOSIS — M25511 Pain in right shoulder: Secondary | ICD-10-CM | POA: Diagnosis not present

## 2015-09-20 DIAGNOSIS — R278 Other lack of coordination: Secondary | ICD-10-CM | POA: Diagnosis not present

## 2015-09-20 DIAGNOSIS — M545 Low back pain: Secondary | ICD-10-CM | POA: Diagnosis not present

## 2015-09-20 DIAGNOSIS — M25512 Pain in left shoulder: Secondary | ICD-10-CM | POA: Diagnosis not present

## 2015-09-20 DIAGNOSIS — R2689 Other abnormalities of gait and mobility: Secondary | ICD-10-CM | POA: Diagnosis not present

## 2015-09-20 DIAGNOSIS — M25511 Pain in right shoulder: Secondary | ICD-10-CM | POA: Diagnosis not present

## 2015-09-21 ENCOUNTER — Other Ambulatory Visit: Payer: Self-pay | Admitting: Internal Medicine

## 2015-09-21 DIAGNOSIS — R2689 Other abnormalities of gait and mobility: Secondary | ICD-10-CM | POA: Diagnosis not present

## 2015-09-21 DIAGNOSIS — M25511 Pain in right shoulder: Secondary | ICD-10-CM | POA: Diagnosis not present

## 2015-09-21 DIAGNOSIS — M25512 Pain in left shoulder: Secondary | ICD-10-CM | POA: Diagnosis not present

## 2015-09-21 DIAGNOSIS — R278 Other lack of coordination: Secondary | ICD-10-CM | POA: Diagnosis not present

## 2015-09-21 DIAGNOSIS — M545 Low back pain: Secondary | ICD-10-CM | POA: Diagnosis not present

## 2015-09-28 DIAGNOSIS — M25511 Pain in right shoulder: Secondary | ICD-10-CM | POA: Diagnosis not present

## 2015-09-28 DIAGNOSIS — R2689 Other abnormalities of gait and mobility: Secondary | ICD-10-CM | POA: Diagnosis not present

## 2015-09-28 DIAGNOSIS — R278 Other lack of coordination: Secondary | ICD-10-CM | POA: Diagnosis not present

## 2015-09-28 DIAGNOSIS — M545 Low back pain: Secondary | ICD-10-CM | POA: Diagnosis not present

## 2015-09-28 DIAGNOSIS — M25512 Pain in left shoulder: Secondary | ICD-10-CM | POA: Diagnosis not present

## 2015-09-29 DIAGNOSIS — M545 Low back pain: Secondary | ICD-10-CM | POA: Diagnosis not present

## 2015-09-29 DIAGNOSIS — M25511 Pain in right shoulder: Secondary | ICD-10-CM | POA: Diagnosis not present

## 2015-09-29 DIAGNOSIS — R2689 Other abnormalities of gait and mobility: Secondary | ICD-10-CM | POA: Diagnosis not present

## 2015-09-29 DIAGNOSIS — M25512 Pain in left shoulder: Secondary | ICD-10-CM | POA: Diagnosis not present

## 2015-09-29 DIAGNOSIS — R278 Other lack of coordination: Secondary | ICD-10-CM | POA: Diagnosis not present

## 2015-10-02 ENCOUNTER — Ambulatory Visit (INDEPENDENT_AMBULATORY_CARE_PROVIDER_SITE_OTHER): Payer: Medicare Other | Admitting: Internal Medicine

## 2015-10-02 ENCOUNTER — Encounter: Payer: Self-pay | Admitting: Internal Medicine

## 2015-10-02 VITALS — BP 146/72 | HR 90 | Temp 98.1°F | Ht 60.0 in | Wt 189.2 lb

## 2015-10-02 DIAGNOSIS — Z794 Long term (current) use of insulin: Secondary | ICD-10-CM

## 2015-10-02 DIAGNOSIS — R2681 Unsteadiness on feet: Secondary | ICD-10-CM | POA: Diagnosis not present

## 2015-10-02 DIAGNOSIS — R300 Dysuria: Secondary | ICD-10-CM

## 2015-10-02 DIAGNOSIS — E118 Type 2 diabetes mellitus with unspecified complications: Secondary | ICD-10-CM | POA: Diagnosis not present

## 2015-10-02 DIAGNOSIS — L84 Corns and callosities: Secondary | ICD-10-CM

## 2015-10-02 DIAGNOSIS — E039 Hypothyroidism, unspecified: Secondary | ICD-10-CM

## 2015-10-02 DIAGNOSIS — I10 Essential (primary) hypertension: Secondary | ICD-10-CM | POA: Diagnosis not present

## 2015-10-02 DIAGNOSIS — E1142 Type 2 diabetes mellitus with diabetic polyneuropathy: Secondary | ICD-10-CM

## 2015-10-02 LAB — POCT URINALYSIS DIPSTICK
Bilirubin, UA: NEGATIVE
Glucose, UA: 250
Ketones, UA: NEGATIVE
Nitrite, UA: NEGATIVE
Protein, UA: NEGATIVE
Spec Grav, UA: 1.01
Urobilinogen, UA: 0.2
pH, UA: 6

## 2015-10-02 MED ORDER — HYDROXYZINE HCL 25 MG PO TABS: 25.0000 mg | ORAL_TABLET | Freq: Every day | ORAL | Status: DC

## 2015-10-02 NOTE — Assessment & Plan Note (Signed)
BP Readings from Last 3 Encounters:  10/02/15 146/72  07/19/15 133/74  07/03/15 148/78   BP generally well controlled. Continue current medication. Renal function with labs.

## 2015-10-02 NOTE — Assessment & Plan Note (Signed)
Strength improved with PT and OT. Will continue. Continue using Aneesa Romey to help prevent falls.

## 2015-10-02 NOTE — Assessment & Plan Note (Signed)
Strength and balance seems to have improved with PT/OT. Will continue current management.

## 2015-10-02 NOTE — Assessment & Plan Note (Addendum)
Recent dysuria. UA today showed trace blood and leukocytes. Will send for culture. Symptoms likely related to glucosuria, rather than infection.

## 2015-10-02 NOTE — Assessment & Plan Note (Signed)
Recheck TSH with labs today. 

## 2015-10-02 NOTE — Patient Instructions (Signed)
Labs today

## 2015-10-02 NOTE — Assessment & Plan Note (Signed)
BG well controlled at home by report. Continue Glipizide. A1c with labs today.

## 2015-10-02 NOTE — Progress Notes (Signed)
Subjective:    Patient ID: Alexandra English, female    DOB: 1927/02/16, 81 y.o.   MRN: [id number]  HPI  81YO female presents for follow up.  Dysuria - Notes some burning with urination over last few weeks. No fever, chills, flank pain. Not taking anything for this.  DM - BG running near 100, however elevated 210 this morning. Compliant with medication.  Also recently seen by podiatry. Recommended diabetic shoes.  Continues with PT and OT twice weekly. Son feels that strength has improved  Wt Readings from Last 3 Encounters:  10/02/15 189 lb 4 oz (85.843 kg)  07/19/15 191 lb 4 oz (86.75 kg)  07/03/15 191 lb (86.637 kg)   BP Readings from Last 3 Encounters:  10/02/15 146/72  07/19/15 133/74  07/03/15 148/78    Past Medical History  Diagnosis Date  . Thyroid disease     hypothyroidism  . Fracture     of rib  . Glaucoma     Dr. Dorcas Mcmurray  . Sleep apnea     Per Pt. was supposed to start CPAP, however reluctant to try  . Hypertension   . Diabetes mellitus 04/2009    HgbA1c 7.1% 07/2009, Monofilament normal, Ortho pending  . Stroke [facility])     Left frontal4/2010, Left parietal and right parietal 04/2011 , Started on Plavix and then changed to Coumadin by recommendation of Dr. Chestine Spore   No family history on file. Past Surgical History  Procedure Laterality Date  . Orthopedic surgeon      Dr. Yisroel Ramming   Social History   Social History  . Marital Status: Divorced    Spouse Name: N/A  . Number of Children: N/A  . Years of Education: N/A   Social History Main Topics  . Smoking status: Never Smoker   . Smokeless tobacco: Never Used  . Alcohol Use: None  . Drug Use: None  . Sexual Activity: Not Asked   Other Topics Concern  . None   Social History Narrative    Review of Systems  Constitutional: Negative for fever, chills, appetite change, fatigue and unexpected weight change.  Eyes: Negative for visual disturbance.  Respiratory: Negative for shortness of breath and  wheezing.   Cardiovascular: Negative for chest pain and leg swelling.  Gastrointestinal: Negative for nausea, vomiting, abdominal pain, diarrhea and constipation.  Genitourinary: Positive for dysuria, urgency and frequency. Negative for hematuria and flank pain.  Musculoskeletal: Negative for myalgias and arthralgias.  Skin: Negative for color change and rash.  Hematological: Negative for adenopathy. Does not bruise/bleed easily.  Psychiatric/Behavioral: Negative for sleep disturbance and dysphoric mood. The patient is not nervous/anxious.        Objective:    BP 146/72 mmHg  Pulse 90  Temp(Src) 98.1 F (36.7 C) (Oral)  Ht 5' (1.524 m)  Wt 189 lb 4 oz (85.843 kg)  BMI 36.96 kg/m2  SpO2 98% Physical Exam  Constitutional: She is oriented to person, place, and time. She appears well-developed and well-nourished. No distress.  HENT:  Head: Normocephalic and atraumatic.  Right Ear: External ear normal.  Left Ear: External ear normal.  Nose: Nose normal.  Mouth/Throat: Oropharynx is clear and moist. No oropharyngeal exudate.  Eyes: Conjunctivae are normal. Pupils are equal, round, and reactive to light. Right eye exhibits no discharge. Left eye exhibits no discharge. No scleral icterus.  Neck: Normal range of motion. Neck supple. No tracheal deviation present. No thyromegaly present.  Cardiovascular: Normal rate, regular rhythm, normal heart sounds and  intact distal pulses.  Exam reveals no gallop and no friction rub.   No murmur heard. Pulmonary/Chest: Effort normal and breath sounds normal. No respiratory distress. She has no wheezes. She has no rales. She exhibits no tenderness.  Musculoskeletal: Normal range of motion. She exhibits edema (trace ankles). She exhibits no tenderness.  Lymphadenopathy:    She has no cervical adenopathy.  Neurological: She is alert and oriented to person, place, and time. No cranial nerve deficit. She exhibits normal muscle tone. Coordination normal.    Skin: Skin is warm and dry. No rash noted. She is not diaphoretic. No erythema. No pallor.  Psychiatric: She has a normal mood and affect. Her behavior is normal. Judgment and thought content normal.          Assessment & Plan:   Problem List Items Addressed This Visit      Unprioritized   Diabetic polyneuropathy associated with type 2 diabetes mellitus (HCC)    Strength and balance seems to have improved with PT/OT. Will continue current management.      Relevant Medications   hydrOXYzine (ATARAX/VISTARIL) 25 MG tablet   DM (diabetes mellitus), type 2 with complications (HCC) - Primary    BG well controlled at home by report. Continue Glipizide. A1c with labs today.      Relevant Orders   Comprehensive metabolic panel   Hemoglobin A1c   Lipid panel   Microalbumin / creatinine urine ratio   Dysuria    Recent dysuria. UA today showed trace blood and leukocytes. Will send for culture. Symptoms likely related to glucosuria, rather than infection.      Relevant Orders   POCT Urinalysis Dipstick (Completed)   Gait instability    Strength improved with PT and OT. Will continue. Continue using [place] [employer] to help prevent falls.      Hypertension    BP Readings from Last 3 Encounters:  10/02/15 146/72  07/19/15 133/74  07/03/15 148/78   BP generally well controlled. Continue current medication. Renal function with labs.      Hypothyroidism    Recheck TSH with labs today.      Relevant Orders   TSH   Pre-ulcerative corn or callous    Prescription written for diabetic shoes.          Return in about 3 months (around 01/02/2016) for Recheck of Diabetes.

## 2015-10-02 NOTE — Assessment & Plan Note (Signed)
Prescription written for diabetic shoes.

## 2015-10-02 NOTE — Progress Notes (Signed)
Pre visit review using our clinic review tool, if applicable. No additional management support is needed unless otherwise documented below in the visit note. 

## 2015-10-03 DIAGNOSIS — R278 Other lack of coordination: Secondary | ICD-10-CM | POA: Diagnosis not present

## 2015-10-03 DIAGNOSIS — M545 Low back pain: Secondary | ICD-10-CM | POA: Diagnosis not present

## 2015-10-03 DIAGNOSIS — M25512 Pain in left shoulder: Secondary | ICD-10-CM | POA: Diagnosis not present

## 2015-10-03 DIAGNOSIS — R2689 Other abnormalities of gait and mobility: Secondary | ICD-10-CM | POA: Diagnosis not present

## 2015-10-03 DIAGNOSIS — M25511 Pain in right shoulder: Secondary | ICD-10-CM | POA: Diagnosis not present

## 2015-10-03 LAB — COMPREHENSIVE METABOLIC PANEL
ALT: 17 U/L (ref 0–35)
AST: 16 U/L (ref 0–37)
Albumin: 4 g/dL (ref 3.5–5.2)
Alkaline Phosphatase: 118 U/L — ABNORMAL HIGH (ref 39–117)
BUN: 13 mg/dL (ref 6–23)
CHLORIDE: 104 meq/L (ref 96–112)
CO2: 24 meq/L (ref 19–32)
CREATININE: 1 mg/dL (ref 0.40–1.20)
Calcium: 9.8 mg/dL (ref 8.4–10.5)
GFR: 55.89 mL/min — ABNORMAL LOW (ref >60.00)
GLUCOSE: 236 mg/dL — AB (ref 70–99)
POTASSIUM: 4 meq/L (ref 3.5–5.1)
SODIUM: 139 meq/L (ref 135–145)
Total Bilirubin: 0.4 mg/dL (ref 0.2–1.2)
Total Protein: 7.4 g/dL (ref 6.0–8.3)

## 2015-10-03 LAB — TSH: TSH: 1.18 u[IU]/mL (ref 0.35–4.50)

## 2015-10-03 LAB — LIPID PANEL
CHOL/HDL RATIO: 4
Cholesterol: 159 mg/dL (ref 0–200)
HDL: 44.5 mg/dL (ref >39.00)
NonHDL: 114.65
Triglycerides: 209 mg/dL — ABNORMAL HIGH (ref 0.0–149.0)
VLDL: 41.8 mg/dL — AB (ref 0.0–40.0)

## 2015-10-03 LAB — MICROALBUMIN / CREATININE URINE RATIO
CREATININE, U: 22.2 mg/dL
MICROALB UR: 4.1 mg/dL — AB (ref 0.0–1.9)
Microalb Creat Ratio: 18.4 mg/g (ref 0.0–30.0)

## 2015-10-03 LAB — HEMOGLOBIN A1C: HEMOGLOBIN A1C: 8.5 % — AB (ref 4.6–6.5)

## 2015-10-03 LAB — LDL CHOLESTEROL, DIRECT: LDL DIRECT: 90 mg/dL

## 2015-10-04 ENCOUNTER — Other Ambulatory Visit: Payer: Self-pay | Admitting: *Deleted

## 2015-10-04 ENCOUNTER — Telehealth: Payer: Self-pay | Admitting: Internal Medicine

## 2015-10-04 LAB — URINE CULTURE: Colony Count: >=100000

## 2015-10-04 MED ORDER — SITAGLIPTIN PHOSPHATE 25 MG PO TABS: 25.0000 mg | ORAL_TABLET | Freq: Every day | ORAL | Status: DC

## 2015-10-04 MED ORDER — DOXYCYCLINE HYCLATE 100 MG PO TABS: 100.0000 mg | ORAL_TABLET | Freq: Two times a day (BID) | ORAL | Status: DC

## 2015-10-04 NOTE — Telephone Encounter (Signed)
See result notes for details, sent RX to pharmacy

## 2015-10-04 NOTE — Telephone Encounter (Signed)
Pt called back stating she received a phone call from the office. I see in her chart that it reads refill from 10/04/2015. Pt was not sure why she received a call either.  I tried to reach PalmerJulie no answer. Thank you!

## 2015-10-06 ENCOUNTER — Other Ambulatory Visit: Payer: Self-pay | Admitting: Internal Medicine

## 2015-10-09 DIAGNOSIS — M25512 Pain in left shoulder: Secondary | ICD-10-CM | POA: Diagnosis not present

## 2015-10-09 DIAGNOSIS — R278 Other lack of coordination: Secondary | ICD-10-CM | POA: Diagnosis not present

## 2015-10-09 DIAGNOSIS — M25511 Pain in right shoulder: Secondary | ICD-10-CM | POA: Diagnosis not present

## 2015-10-09 DIAGNOSIS — R2689 Other abnormalities of gait and mobility: Secondary | ICD-10-CM | POA: Diagnosis not present

## 2015-10-09 DIAGNOSIS — M545 Low back pain: Secondary | ICD-10-CM | POA: Diagnosis not present

## 2015-10-10 DIAGNOSIS — R278 Other lack of coordination: Secondary | ICD-10-CM | POA: Diagnosis not present

## 2015-10-10 DIAGNOSIS — R2689 Other abnormalities of gait and mobility: Secondary | ICD-10-CM | POA: Diagnosis not present

## 2015-10-10 DIAGNOSIS — M25512 Pain in left shoulder: Secondary | ICD-10-CM | POA: Diagnosis not present

## 2015-10-10 DIAGNOSIS — M545 Low back pain: Secondary | ICD-10-CM | POA: Diagnosis not present

## 2015-10-10 DIAGNOSIS — M25511 Pain in right shoulder: Secondary | ICD-10-CM | POA: Diagnosis not present

## 2015-10-12 ENCOUNTER — Other Ambulatory Visit: Payer: Self-pay | Admitting: Internal Medicine

## 2015-10-16 DIAGNOSIS — M545 Low back pain: Secondary | ICD-10-CM | POA: Diagnosis not present

## 2015-10-16 DIAGNOSIS — R2689 Other abnormalities of gait and mobility: Secondary | ICD-10-CM | POA: Diagnosis not present

## 2015-10-16 DIAGNOSIS — M25511 Pain in right shoulder: Secondary | ICD-10-CM | POA: Diagnosis not present

## 2015-10-16 DIAGNOSIS — M25512 Pain in left shoulder: Secondary | ICD-10-CM | POA: Diagnosis not present

## 2015-10-16 DIAGNOSIS — R278 Other lack of coordination: Secondary | ICD-10-CM | POA: Diagnosis not present

## 2015-10-18 DIAGNOSIS — M25511 Pain in right shoulder: Secondary | ICD-10-CM | POA: Diagnosis not present

## 2015-10-18 DIAGNOSIS — R278 Other lack of coordination: Secondary | ICD-10-CM | POA: Diagnosis not present

## 2015-10-18 DIAGNOSIS — R2689 Other abnormalities of gait and mobility: Secondary | ICD-10-CM | POA: Diagnosis not present

## 2015-10-18 DIAGNOSIS — M545 Low back pain: Secondary | ICD-10-CM | POA: Diagnosis not present

## 2015-10-18 DIAGNOSIS — M25512 Pain in left shoulder: Secondary | ICD-10-CM | POA: Diagnosis not present

## 2015-10-19 DIAGNOSIS — R2689 Other abnormalities of gait and mobility: Secondary | ICD-10-CM | POA: Diagnosis not present

## 2015-10-19 DIAGNOSIS — M545 Low back pain: Secondary | ICD-10-CM | POA: Diagnosis not present

## 2015-10-19 DIAGNOSIS — R278 Other lack of coordination: Secondary | ICD-10-CM | POA: Diagnosis not present

## 2015-10-19 DIAGNOSIS — M25511 Pain in right shoulder: Secondary | ICD-10-CM | POA: Diagnosis not present

## 2015-10-19 DIAGNOSIS — M25512 Pain in left shoulder: Secondary | ICD-10-CM | POA: Diagnosis not present

## 2015-10-23 DIAGNOSIS — R278 Other lack of coordination: Secondary | ICD-10-CM | POA: Diagnosis not present

## 2015-10-23 DIAGNOSIS — M25511 Pain in right shoulder: Secondary | ICD-10-CM | POA: Diagnosis not present

## 2015-10-23 DIAGNOSIS — M25512 Pain in left shoulder: Secondary | ICD-10-CM | POA: Diagnosis not present

## 2015-10-23 DIAGNOSIS — R2689 Other abnormalities of gait and mobility: Secondary | ICD-10-CM | POA: Diagnosis not present

## 2015-10-23 DIAGNOSIS — M545 Low back pain: Secondary | ICD-10-CM | POA: Diagnosis not present

## 2015-10-24 DIAGNOSIS — M545 Low back pain: Secondary | ICD-10-CM | POA: Diagnosis not present

## 2015-10-24 DIAGNOSIS — M25512 Pain in left shoulder: Secondary | ICD-10-CM | POA: Diagnosis not present

## 2015-10-24 DIAGNOSIS — R2689 Other abnormalities of gait and mobility: Secondary | ICD-10-CM | POA: Diagnosis not present

## 2015-10-24 DIAGNOSIS — R278 Other lack of coordination: Secondary | ICD-10-CM | POA: Diagnosis not present

## 2015-10-24 DIAGNOSIS — M25511 Pain in right shoulder: Secondary | ICD-10-CM | POA: Diagnosis not present

## 2015-10-25 DIAGNOSIS — R278 Other lack of coordination: Secondary | ICD-10-CM | POA: Diagnosis not present

## 2015-10-25 DIAGNOSIS — M545 Low back pain: Secondary | ICD-10-CM | POA: Diagnosis not present

## 2015-10-25 DIAGNOSIS — R2689 Other abnormalities of gait and mobility: Secondary | ICD-10-CM | POA: Diagnosis not present

## 2015-10-25 DIAGNOSIS — M25511 Pain in right shoulder: Secondary | ICD-10-CM | POA: Diagnosis not present

## 2015-10-25 DIAGNOSIS — M25512 Pain in left shoulder: Secondary | ICD-10-CM | POA: Diagnosis not present

## 2015-10-26 DIAGNOSIS — M25511 Pain in right shoulder: Secondary | ICD-10-CM | POA: Diagnosis not present

## 2015-10-26 DIAGNOSIS — R2689 Other abnormalities of gait and mobility: Secondary | ICD-10-CM | POA: Diagnosis not present

## 2015-10-26 DIAGNOSIS — R278 Other lack of coordination: Secondary | ICD-10-CM | POA: Diagnosis not present

## 2015-10-26 DIAGNOSIS — M25512 Pain in left shoulder: Secondary | ICD-10-CM | POA: Diagnosis not present

## 2015-10-26 DIAGNOSIS — M545 Low back pain: Secondary | ICD-10-CM | POA: Diagnosis not present

## 2015-10-27 ENCOUNTER — Other Ambulatory Visit: Payer: Self-pay | Admitting: Internal Medicine

## 2015-10-30 ENCOUNTER — Telehealth: Payer: Self-pay | Admitting: Internal Medicine

## 2015-10-30 DIAGNOSIS — M25511 Pain in right shoulder: Secondary | ICD-10-CM | POA: Diagnosis not present

## 2015-10-30 DIAGNOSIS — M25512 Pain in left shoulder: Secondary | ICD-10-CM | POA: Diagnosis not present

## 2015-10-30 DIAGNOSIS — M545 Low back pain: Secondary | ICD-10-CM | POA: Diagnosis not present

## 2015-10-30 DIAGNOSIS — R278 Other lack of coordination: Secondary | ICD-10-CM | POA: Diagnosis not present

## 2015-10-30 DIAGNOSIS — R2689 Other abnormalities of gait and mobility: Secondary | ICD-10-CM | POA: Diagnosis not present

## 2015-10-30 NOTE — Telephone Encounter (Signed)
Patient stated that she needed a refill and that [facility] wouldn't refill it. When [facility] was called they stated she got it on 10/14/15 for a 90 day supple. Patient states it was only 30 days i recommended she talk to the pharmacy.

## 2015-10-31 DIAGNOSIS — R2689 Other abnormalities of gait and mobility: Secondary | ICD-10-CM | POA: Diagnosis not present

## 2015-10-31 DIAGNOSIS — M25511 Pain in right shoulder: Secondary | ICD-10-CM | POA: Diagnosis not present

## 2015-10-31 DIAGNOSIS — R278 Other lack of coordination: Secondary | ICD-10-CM | POA: Diagnosis not present

## 2015-10-31 DIAGNOSIS — M545 Low back pain: Secondary | ICD-10-CM | POA: Diagnosis not present

## 2015-10-31 DIAGNOSIS — M25512 Pain in left shoulder: Secondary | ICD-10-CM | POA: Diagnosis not present

## 2015-11-01 DIAGNOSIS — M25512 Pain in left shoulder: Secondary | ICD-10-CM | POA: Diagnosis not present

## 2015-11-01 DIAGNOSIS — R278 Other lack of coordination: Secondary | ICD-10-CM | POA: Diagnosis not present

## 2015-11-01 DIAGNOSIS — R2689 Other abnormalities of gait and mobility: Secondary | ICD-10-CM | POA: Diagnosis not present

## 2015-11-01 DIAGNOSIS — M545 Low back pain: Secondary | ICD-10-CM | POA: Diagnosis not present

## 2015-11-01 DIAGNOSIS — M25511 Pain in right shoulder: Secondary | ICD-10-CM | POA: Diagnosis not present

## 2015-11-06 DIAGNOSIS — M25512 Pain in left shoulder: Secondary | ICD-10-CM | POA: Diagnosis not present

## 2015-11-06 DIAGNOSIS — M25511 Pain in right shoulder: Secondary | ICD-10-CM | POA: Diagnosis not present

## 2015-11-06 DIAGNOSIS — R2689 Other abnormalities of gait and mobility: Secondary | ICD-10-CM | POA: Diagnosis not present

## 2015-11-06 DIAGNOSIS — R278 Other lack of coordination: Secondary | ICD-10-CM | POA: Diagnosis not present

## 2015-11-06 DIAGNOSIS — M545 Low back pain: Secondary | ICD-10-CM | POA: Diagnosis not present

## 2015-11-07 DIAGNOSIS — R278 Other lack of coordination: Secondary | ICD-10-CM | POA: Diagnosis not present

## 2015-11-07 DIAGNOSIS — M545 Low back pain: Secondary | ICD-10-CM | POA: Diagnosis not present

## 2015-11-07 DIAGNOSIS — M25512 Pain in left shoulder: Secondary | ICD-10-CM | POA: Diagnosis not present

## 2015-11-07 DIAGNOSIS — M25511 Pain in right shoulder: Secondary | ICD-10-CM | POA: Diagnosis not present

## 2015-11-07 DIAGNOSIS — R2689 Other abnormalities of gait and mobility: Secondary | ICD-10-CM | POA: Diagnosis not present

## 2015-11-08 DIAGNOSIS — R2689 Other abnormalities of gait and mobility: Secondary | ICD-10-CM | POA: Diagnosis not present

## 2015-11-08 DIAGNOSIS — M545 Low back pain: Secondary | ICD-10-CM | POA: Diagnosis not present

## 2015-11-08 DIAGNOSIS — M25511 Pain in right shoulder: Secondary | ICD-10-CM | POA: Diagnosis not present

## 2015-11-08 DIAGNOSIS — M25512 Pain in left shoulder: Secondary | ICD-10-CM | POA: Diagnosis not present

## 2015-11-08 DIAGNOSIS — R278 Other lack of coordination: Secondary | ICD-10-CM | POA: Diagnosis not present

## 2015-11-15 DIAGNOSIS — R278 Other lack of coordination: Secondary | ICD-10-CM | POA: Diagnosis not present

## 2015-11-15 DIAGNOSIS — M25512 Pain in left shoulder: Secondary | ICD-10-CM | POA: Diagnosis not present

## 2015-11-15 DIAGNOSIS — R2689 Other abnormalities of gait and mobility: Secondary | ICD-10-CM | POA: Diagnosis not present

## 2015-11-15 DIAGNOSIS — M545 Low back pain: Secondary | ICD-10-CM | POA: Diagnosis not present

## 2015-11-15 DIAGNOSIS — M25511 Pain in right shoulder: Secondary | ICD-10-CM | POA: Diagnosis not present

## 2015-11-16 DIAGNOSIS — M25511 Pain in right shoulder: Secondary | ICD-10-CM | POA: Diagnosis not present

## 2015-11-16 DIAGNOSIS — M25512 Pain in left shoulder: Secondary | ICD-10-CM | POA: Diagnosis not present

## 2015-11-16 DIAGNOSIS — R278 Other lack of coordination: Secondary | ICD-10-CM | POA: Diagnosis not present

## 2015-11-16 DIAGNOSIS — R2689 Other abnormalities of gait and mobility: Secondary | ICD-10-CM | POA: Diagnosis not present

## 2015-11-16 DIAGNOSIS — M545 Low back pain: Secondary | ICD-10-CM | POA: Diagnosis not present

## 2015-11-21 DIAGNOSIS — M25511 Pain in right shoulder: Secondary | ICD-10-CM | POA: Diagnosis not present

## 2015-11-21 DIAGNOSIS — M545 Low back pain: Secondary | ICD-10-CM | POA: Diagnosis not present

## 2015-11-21 DIAGNOSIS — M25512 Pain in left shoulder: Secondary | ICD-10-CM | POA: Diagnosis not present

## 2015-11-21 DIAGNOSIS — R2689 Other abnormalities of gait and mobility: Secondary | ICD-10-CM | POA: Diagnosis not present

## 2015-11-21 DIAGNOSIS — R278 Other lack of coordination: Secondary | ICD-10-CM | POA: Diagnosis not present

## 2015-11-22 DIAGNOSIS — M545 Low back pain: Secondary | ICD-10-CM | POA: Diagnosis not present

## 2015-11-22 DIAGNOSIS — M25512 Pain in left shoulder: Secondary | ICD-10-CM | POA: Diagnosis not present

## 2015-11-22 DIAGNOSIS — M25511 Pain in right shoulder: Secondary | ICD-10-CM | POA: Diagnosis not present

## 2015-11-22 DIAGNOSIS — R2689 Other abnormalities of gait and mobility: Secondary | ICD-10-CM | POA: Diagnosis not present

## 2015-11-22 DIAGNOSIS — R278 Other lack of coordination: Secondary | ICD-10-CM | POA: Diagnosis not present

## 2015-11-24 DIAGNOSIS — M545 Low back pain: Secondary | ICD-10-CM | POA: Diagnosis not present

## 2015-11-24 DIAGNOSIS — R2689 Other abnormalities of gait and mobility: Secondary | ICD-10-CM | POA: Diagnosis not present

## 2015-11-24 DIAGNOSIS — M25511 Pain in right shoulder: Secondary | ICD-10-CM | POA: Diagnosis not present

## 2015-11-24 DIAGNOSIS — R278 Other lack of coordination: Secondary | ICD-10-CM | POA: Diagnosis not present

## 2015-11-24 DIAGNOSIS — M25512 Pain in left shoulder: Secondary | ICD-10-CM | POA: Diagnosis not present

## 2015-11-29 DIAGNOSIS — R278 Other lack of coordination: Secondary | ICD-10-CM | POA: Diagnosis not present

## 2015-11-29 DIAGNOSIS — M545 Low back pain: Secondary | ICD-10-CM | POA: Diagnosis not present

## 2015-11-29 DIAGNOSIS — M25511 Pain in right shoulder: Secondary | ICD-10-CM | POA: Diagnosis not present

## 2015-11-29 DIAGNOSIS — M25512 Pain in left shoulder: Secondary | ICD-10-CM | POA: Diagnosis not present

## 2015-11-29 DIAGNOSIS — R2689 Other abnormalities of gait and mobility: Secondary | ICD-10-CM | POA: Diagnosis not present

## 2015-12-01 DIAGNOSIS — M25512 Pain in left shoulder: Secondary | ICD-10-CM | POA: Diagnosis not present

## 2015-12-01 DIAGNOSIS — R278 Other lack of coordination: Secondary | ICD-10-CM | POA: Diagnosis not present

## 2015-12-01 DIAGNOSIS — M25511 Pain in right shoulder: Secondary | ICD-10-CM | POA: Diagnosis not present

## 2015-12-01 DIAGNOSIS — R2689 Other abnormalities of gait and mobility: Secondary | ICD-10-CM | POA: Diagnosis not present

## 2015-12-01 DIAGNOSIS — M545 Low back pain: Secondary | ICD-10-CM | POA: Diagnosis not present

## 2015-12-06 ENCOUNTER — Other Ambulatory Visit: Payer: Self-pay | Admitting: Internal Medicine

## 2015-12-28 DIAGNOSIS — R278 Other lack of coordination: Secondary | ICD-10-CM | POA: Diagnosis not present

## 2015-12-28 DIAGNOSIS — R2689 Other abnormalities of gait and mobility: Secondary | ICD-10-CM | POA: Diagnosis not present

## 2015-12-28 DIAGNOSIS — M545 Low back pain: Secondary | ICD-10-CM | POA: Diagnosis not present

## 2016-01-02 DIAGNOSIS — M545 Low back pain: Secondary | ICD-10-CM | POA: Diagnosis not present

## 2016-01-02 DIAGNOSIS — R2689 Other abnormalities of gait and mobility: Secondary | ICD-10-CM | POA: Diagnosis not present

## 2016-01-02 DIAGNOSIS — R278 Other lack of coordination: Secondary | ICD-10-CM | POA: Diagnosis not present

## 2016-01-09 DIAGNOSIS — R278 Other lack of coordination: Secondary | ICD-10-CM | POA: Diagnosis not present

## 2016-01-09 DIAGNOSIS — M545 Low back pain: Secondary | ICD-10-CM | POA: Diagnosis not present

## 2016-01-09 DIAGNOSIS — R2689 Other abnormalities of gait and mobility: Secondary | ICD-10-CM | POA: Diagnosis not present

## 2016-01-15 ENCOUNTER — Other Ambulatory Visit: Payer: Self-pay | Admitting: Internal Medicine

## 2016-01-16 ENCOUNTER — Telehealth: Payer: Self-pay | Admitting: *Deleted

## 2016-01-16 NOTE — Telephone Encounter (Signed)
She needs to be seen. With altered mental status, this may be more than UTI. She has h/o stroke. Recommend evaluation today. May have to be at urgent care or ED, given time.

## 2016-01-16 NOTE — Telephone Encounter (Signed)
Please advise 

## 2016-01-16 NOTE — Telephone Encounter (Signed)
Patient son stated that his mother may have a possible UTI. She's having burning while urinating, off balance and confused mental status. He's aware that she would need an appointment to receive medication, however due to her age he requested that she has something called in, or pick up a specimen cup to bring back for testing. Please advise Please call Son [phone]

## 2016-01-16 NOTE — Telephone Encounter (Signed)
Pt's son stated that de woould rather bring her in to see Dr.Walker, scheduled pt for 8am

## 2016-01-17 ENCOUNTER — Encounter: Payer: Self-pay | Admitting: Internal Medicine

## 2016-01-17 ENCOUNTER — Ambulatory Visit (INDEPENDENT_AMBULATORY_CARE_PROVIDER_SITE_OTHER): Payer: Medicare Other | Admitting: Internal Medicine

## 2016-01-17 VITALS — BP 164/66 | HR 83 | Temp 97.6°F | Ht 60.0 in | Wt 190.5 lb

## 2016-01-17 DIAGNOSIS — F418 Other specified anxiety disorders: Secondary | ICD-10-CM | POA: Diagnosis not present

## 2016-01-17 DIAGNOSIS — Z794 Long term (current) use of insulin: Secondary | ICD-10-CM

## 2016-01-17 DIAGNOSIS — F329 Major depressive disorder, single episode, unspecified: Secondary | ICD-10-CM

## 2016-01-17 DIAGNOSIS — B372 Candidiasis of skin and nail: Secondary | ICD-10-CM | POA: Diagnosis not present

## 2016-01-17 DIAGNOSIS — R300 Dysuria: Secondary | ICD-10-CM | POA: Diagnosis not present

## 2016-01-17 DIAGNOSIS — E118 Type 2 diabetes mellitus with unspecified complications: Secondary | ICD-10-CM

## 2016-01-17 DIAGNOSIS — F419 Anxiety disorder, unspecified: Secondary | ICD-10-CM

## 2016-01-17 LAB — COMPREHENSIVE METABOLIC PANEL
ALBUMIN: 4.1 g/dL (ref 3.5–5.2)
ALT: 17 U/L (ref 0–35)
AST: 19 U/L (ref 0–37)
Alkaline Phosphatase: 114 U/L (ref 39–117)
BUN: 13 mg/dL (ref 6–23)
CHLORIDE: 101 meq/L (ref 96–112)
CO2: 19 meq/L (ref 19–32)
CREATININE: 0.85 mg/dL (ref 0.40–1.20)
Calcium: 10.1 mg/dL (ref 8.4–10.5)
GFR: 67.37 mL/min (ref >60.00)
Glucose, Bld: 217 mg/dL — ABNORMAL HIGH (ref 70–99)
Potassium: 4.1 mEq/L (ref 3.5–5.1)
SODIUM: 136 meq/L (ref 135–145)
Total Bilirubin: 0.4 mg/dL (ref 0.2–1.2)
Total Protein: 8 g/dL (ref 6.0–8.3)

## 2016-01-17 LAB — POCT URINALYSIS DIPSTICK
Bilirubin, UA: NEGATIVE
GLUCOSE UA: NEGATIVE
Ketones, UA: NEGATIVE
NITRITE UA: NEGATIVE
PH UA: 5
Protein, UA: NEGATIVE
Spec Grav, UA: <=1.005
UROBILINOGEN UA: 0.2

## 2016-01-17 LAB — HEMOGLOBIN A1C: Hgb A1c MFr Bld: 9.4 % — ABNORMAL HIGH (ref 4.6–6.5)

## 2016-01-17 MED ORDER — DOXYCYCLINE HYCLATE 100 MG PO TABS: 100.0000 mg | ORAL_TABLET | Freq: Two times a day (BID) | ORAL | Status: DC

## 2016-01-17 MED ORDER — NYSTATIN 100000 UNIT/GM EX CREA: 1.0000 "application " | TOPICAL_CREAM | Freq: Two times a day (BID) | CUTANEOUS | Status: DC

## 2016-01-17 MED ORDER — MIRTAZAPINE 30 MG PO TABS: 30.0000 mg | ORAL_TABLET | Freq: Every day | ORAL | Status: DC

## 2016-01-17 NOTE — Assessment & Plan Note (Signed)
Rash consistent with candidal dermatitis left breast. Will start topical Nystatin. Follow up 2 weeks for recheck.

## 2016-01-17 NOTE — Progress Notes (Addendum)
Subjective:    Patient ID: Alexandra English, female    DOB: December 07, 1927, 82 y.o.   MRN: [id number]  HPI  82YO female presents for acute visit.  Dysuria - Having some burning with urination. No urinary frequency. No fever, chills. Ongoing since October.  DM - Previous BG levels were elevated. Started on Januvia.  Not checking BG.   Not feeling well. Feels anxious. Short-tempered. Upset about little things. More confused recently when answering questions with son.  Lab Results  Component Value Date   HGBA1C 8.5* 10/02/2015    Rash under breast on left for about 1 week. Red and painful at times. Not using anything for this.   Wt Readings from Last 3 Encounters:  01/17/16 190 lb 8 oz (86.41 kg)  10/02/15 189 lb 4 oz (85.843 kg)  07/19/15 191 lb 4 oz (86.75 kg)   BP Readings from Last 3 Encounters:  01/17/16 164/66  10/02/15 146/72  07/19/15 133/74    Past Medical History  Diagnosis Date  . Thyroid disease     hypothyroidism  . Fracture     of rib  . Glaucoma     Dr. Dorcas Mcmurray  . Sleep apnea     Per Pt. was supposed to start CPAP, however reluctant to try  . Hypertension   . Diabetes mellitus 04/2009    HgbA1c 7.1% 07/2009, Monofilament normal, Ortho pending  . Stroke [facility])     Left frontal4/2010, Left parietal and right parietal 04/2011 , Started on Plavix and then changed to Coumadin by recommendation of Dr. Chestine Spore   No family history on file. Past Surgical History  Procedure Laterality Date  . Orthopedic surgeon      Dr. Yisroel Ramming   Social History   Social History  . Marital Status: Divorced    Spouse Name: N/A  . Number of Children: N/A  . Years of Education: N/A   Social History Main Topics  . Smoking status: Never Smoker   . Smokeless tobacco: Never Used  . Alcohol Use: None  . Drug Use: None  . Sexual Activity: Not Asked   Other Topics Concern  . None   Social History Narrative    Review of Systems  Constitutional: Positive for fatigue.  Negative for fever and chills.  Gastrointestinal: Negative for nausea, vomiting, abdominal pain, diarrhea, constipation and rectal pain.  Genitourinary: Positive for dysuria. Negative for urgency, frequency, hematuria, flank pain, decreased urine volume, vaginal bleeding, vaginal discharge, difficulty urinating, vaginal pain and pelvic pain.  Psychiatric/Behavioral: Positive for sleep disturbance, dysphoric mood and decreased concentration. The patient is nervous/anxious.        Objective:    BP 164/66 mmHg  Pulse 83  Temp(Src) 97.6 F (36.4 C) (Oral)  Ht 5' (1.524 m)  Wt 190 lb 8 oz (86.41 kg)  BMI 37.20 kg/m2  SpO2 99% Physical Exam  Constitutional: She is oriented to person, place, and time. She appears well-developed and well-nourished. No distress.  HENT:  Head: Normocephalic and atraumatic.  Right Ear: External ear normal.  Left Ear: External ear normal.  Nose: Nose normal.  Mouth/Throat: Oropharynx is clear and moist. No oropharyngeal exudate.  Eyes: Conjunctivae are normal. Pupils are equal, round, and reactive to light. Right eye exhibits no discharge. Left eye exhibits no discharge. No scleral icterus.  Neck: Normal range of motion. Neck supple. No tracheal deviation present. No thyromegaly present.  Cardiovascular: Normal rate, regular rhythm, normal heart sounds and intact distal pulses.  Exam reveals no gallop  and no friction rub.   No murmur heard. Pulmonary/Chest: Effort normal and breath sounds normal. No respiratory distress. She has no wheezes. She has no rales. She exhibits no tenderness.  Musculoskeletal: Normal range of motion. She exhibits no edema or tenderness.  Lymphadenopathy:    She has no cervical adenopathy.  Neurological: She is alert and oriented to person, place, and time. No cranial nerve deficit. She exhibits normal muscle tone. Coordination normal.  Skin: Skin is warm and dry. Rash noted. Rash is maculopapular. She is not diaphoretic. No erythema.  No pallor.     Psychiatric: Her speech is normal and behavior is normal. Judgment and thought content normal. Her mood appears anxious. She exhibits a depressed mood.          Assessment & Plan:   Problem List Items Addressed This Visit      Unprioritized   Anxiety and depression    Worsening anxiety and depression with some decrease in short term memory. Possibly related to current UTI. Treating UTI and will increase Remeron to  daily. Follow up in 2 weeks for recheck.      Relevant Medications   mirtazapine (REMERON) 30 MG tablet   Candidal dermatitis    Rash consistent with candidal dermatitis left breast. Will start topical Nystatin. Follow up 2 weeks for recheck.      Relevant Medications   nystatin cream (MYCOSTATIN)   DM (diabetes mellitus), type 2 with complications (HCC)    Will check A1c with labs. Started on Januvia last visit. Does not check BG.      Relevant Orders   Comprehensive metabolic panel   Hemoglobin A1c   Dysuria - Primary    UA today pos for blood and leuk. Will send urine for culture. Start empiric doxycycline based on last urine culture. Follow up 1-2 weeks.      Relevant Orders   POCT Urinalysis Dipstick   POCT urinalysis dipstick (Completed)   CULTURE, URINE COMPREHENSIVE       Return in about 2 weeks (around 01/31/2016) for Recheck.

## 2016-01-17 NOTE — Patient Instructions (Addendum)
Labs today.  Start Doxycycline  twice daily.  Increase Remeron to  at bedtime.  Start Nystatin twice daily to underneath left breast.

## 2016-01-17 NOTE — Assessment & Plan Note (Signed)
UA today pos for blood and leuk. Will send urine for culture. Start empiric doxycycline based on last urine culture. Follow up 1-2 weeks.

## 2016-01-17 NOTE — Addendum Note (Signed)
Addended by: Ronna Polio A on: 01/17/2016 09:29 AM   Modules accepted: Kipp Brood

## 2016-01-17 NOTE — Assessment & Plan Note (Signed)
Will check A1c with labs. Started on Januvia last visit. Does not check BG.

## 2016-01-17 NOTE — Assessment & Plan Note (Signed)
Worsening anxiety and depression with some decrease in short term memory. Possibly related to current UTI. Treating UTI and will increase Remeron to  daily. Follow up in 2 weeks for recheck.

## 2016-01-28 LAB — CULTURE, URINE COMPREHENSIVE: Colony Count: >=100000

## 2016-01-29 ENCOUNTER — Other Ambulatory Visit: Payer: Self-pay | Admitting: Internal Medicine

## 2016-02-01 ENCOUNTER — Ambulatory Visit (INDEPENDENT_AMBULATORY_CARE_PROVIDER_SITE_OTHER): Payer: Medicare Other | Admitting: Internal Medicine

## 2016-02-01 ENCOUNTER — Encounter: Payer: Self-pay | Admitting: Internal Medicine

## 2016-02-01 VITALS — BP 148/74 | HR 79 | Temp 97.6°F | Ht 60.0 in | Wt 189.5 lb

## 2016-02-01 DIAGNOSIS — R300 Dysuria: Secondary | ICD-10-CM | POA: Diagnosis not present

## 2016-02-01 DIAGNOSIS — E118 Type 2 diabetes mellitus with unspecified complications: Secondary | ICD-10-CM

## 2016-02-01 DIAGNOSIS — Z794 Long term (current) use of insulin: Secondary | ICD-10-CM

## 2016-02-01 LAB — POCT URINALYSIS DIPSTICK
Bilirubin, UA: NEGATIVE
Glucose, UA: NEGATIVE
Ketones, UA: NEGATIVE
Nitrite, UA: NEGATIVE
Protein, UA: NEGATIVE
Spec Grav, UA: <=1.005
Urobilinogen, UA: 0.2
pH, UA: 6

## 2016-02-01 NOTE — Progress Notes (Signed)
Pre visit review using our clinic review tool, if applicable. No additional management support is needed unless otherwise documented below in the visit note. 

## 2016-02-01 NOTE — Assessment & Plan Note (Signed)
BG improved with son's oversight of her medications and sugar intake. Will continue Glipizide and Januvia. Follow up with labs in 3 months.

## 2016-02-01 NOTE — Patient Instructions (Signed)
Continue current medications.  Follow up in 3 months and sooner as needed. 

## 2016-02-01 NOTE — Assessment & Plan Note (Signed)
Symptoms improved. UA today shows only trace blood and mod leuk. Will repeat culture. Follow up prn if symptoms recur.

## 2016-02-01 NOTE — Progress Notes (Signed)
Subjective:    Patient ID: Alexandra English, female    DOB: [birth year], 82 y.o.   MRN: [id number]  HPI  82YO female presents for follow up.  Recently treated for UTI.  Continues to have some intermittent dysuria. However improved compared to previous. Son reports mental status improved.  DM - BG have been improved. Son has been monitoring more closely 170s-200s versus over 300 in the past. Son reports she is compliant with medication now, but thinks she was not taking Januvia before. Son has been helping her limit intake of fruit juice.    Lab Results  Component Value Date   HGBA1C 9.4* 01/17/2016     Wt Readings from Last 3 Encounters:  02/01/16 189 lb 8 oz (85.957 kg)  01/17/16 190 lb 8 oz (86.41 kg)  10/02/15 189 lb 4 oz (85.843 kg)   BP Readings from Last 3 Encounters:  02/01/16 148/74  01/17/16 164/66  10/02/15 146/72    Past Medical History  Diagnosis Date  . Thyroid disease     hypothyroidism  . Fracture     of rib  . Glaucoma     Dr. Dorcas Mcmurray  . Sleep apnea     Per Pt. was supposed to start CPAP, however reluctant to try  . Hypertension   . Diabetes mellitus 04/2009    HgbA1c 7.1% 07/2009, Monofilament normal, Ortho pending  . Stroke [facility])     Left frontal4/2010, Left parietal and right parietal 04/2011 , Started on Plavix and then changed to Coumadin by recommendation of Dr. Chestine Spore   No family history on file. Past Surgical History  Procedure Laterality Date  . Orthopedic surgeon      Dr. Yisroel Ramming   Social History   Social History  . Marital Status: Divorced    Spouse Name: N/A  . Number of Children: N/A  . Years of Education: N/A   Social History Main Topics  . Smoking status: Never Smoker   . Smokeless tobacco: Never Used  . Alcohol Use: None  . Drug Use: None  . Sexual Activity: Not Asked   Other Topics Concern  . None   Social History Narrative    Review of Systems  Constitutional: Negative for fever, chills, appetite change,  fatigue and unexpected weight change.  Eyes: Negative for visual disturbance.  Respiratory: Negative for shortness of breath.   Cardiovascular: Negative for chest pain and leg swelling.  Gastrointestinal: Negative for vomiting, abdominal pain, diarrhea and constipation.  Genitourinary: Positive for dysuria. Negative for urgency and flank pain.  Skin: Negative for color change and rash.  Hematological: Negative for adenopathy. Does not bruise/bleed easily.  Psychiatric/Behavioral: Negative for dysphoric mood. The patient is not nervous/anxious.        Objective:    BP 148/74 mmHg  Pulse 79  Temp(Src) 97.6 F (36.4 C) (Oral)  Ht 5' (1.524 m)  Wt 189 lb 8 oz (85.957 kg)  BMI 37.01 kg/m2  SpO2 100% Physical Exam  Constitutional: She is oriented to person, place, and time. She appears well-developed and well-nourished. No distress.  HENT:  Head: Normocephalic and atraumatic.  Right Ear: External ear normal.  Left Ear: External ear normal.  Nose: Nose normal.  Mouth/Throat: Oropharynx is clear and moist. No oropharyngeal exudate.  Eyes: Conjunctivae are normal. Pupils are equal, round, and reactive to light. Right eye exhibits no discharge. Left eye exhibits no discharge. No scleral icterus.  Neck: Normal range of motion. Neck supple. No tracheal deviation present. No thyromegaly  present.  Cardiovascular: Normal rate, regular rhythm, normal heart sounds and intact distal pulses.  Exam reveals no gallop and no friction rub.   No murmur heard. Pulmonary/Chest: Effort normal and breath sounds normal. No respiratory distress. She has no wheezes. She has no rales. She exhibits no tenderness.  Musculoskeletal: Normal range of motion. She exhibits no edema or tenderness.  Lymphadenopathy:    She has no cervical adenopathy.  Neurological: She is alert and oriented to person, place, and time. No cranial nerve deficit. She exhibits normal muscle tone. Coordination normal.  Skin: Skin is warm  and dry. No rash noted. She is not diaphoretic. No erythema. No pallor.  Psychiatric: She has a normal mood and affect. Her behavior is normal. Judgment and thought content normal.          Assessment & Plan:   Problem List Items Addressed This Visit      Unprioritized   DM (diabetes mellitus), type 2 with complications (HCC) - Primary    BG improved with son's oversight of her medications and sugar intake. Will continue Glipizide and Januvia. Follow up with labs in 3 months.      Relevant Orders   Comprehensive metabolic panel   Hemoglobin A1c   Dysuria    Symptoms improved. UA today shows only trace blood and mod leuk. Will repeat culture. Follow up prn if symptoms recur.      Relevant Orders   POCT Urinalysis Dipstick   CULTURE, URINE COMPREHENSIVE       No Follow-up on file.

## 2016-02-02 LAB — CULTURE, URINE COMPREHENSIVE
Colony Count: NO GROWTH
Organism ID, Bacteria: NO GROWTH

## 2016-03-07 ENCOUNTER — Other Ambulatory Visit: Payer: Self-pay | Admitting: Internal Medicine

## 2016-04-01 ENCOUNTER — Other Ambulatory Visit: Payer: Medicare Other

## 2016-04-26 DIAGNOSIS — H401132 Primary open-angle glaucoma, bilateral, moderate stage: Secondary | ICD-10-CM | POA: Diagnosis not present

## 2016-04-26 DIAGNOSIS — E119 Type 2 diabetes mellitus without complications: Secondary | ICD-10-CM | POA: Diagnosis not present

## 2016-04-26 LAB — HM DIABETES EYE EXAM

## 2016-04-29 ENCOUNTER — Other Ambulatory Visit: Payer: Medicare Other

## 2016-05-01 ENCOUNTER — Ambulatory Visit: Payer: Medicare Other | Admitting: Internal Medicine

## 2016-05-01 DIAGNOSIS — Z0289 Encounter for other administrative examinations: Secondary | ICD-10-CM

## 2016-05-29 ENCOUNTER — Other Ambulatory Visit: Payer: Self-pay | Admitting: Internal Medicine

## 2016-06-10 ENCOUNTER — Other Ambulatory Visit: Payer: Self-pay | Admitting: Internal Medicine

## 2016-07-12 ENCOUNTER — Telehealth: Payer: Self-pay | Admitting: *Deleted

## 2016-07-12 MED ORDER — SITAGLIPTIN PHOSPHATE 25 MG PO TABS
25.0000 mg | ORAL_TABLET | Freq: Every day | ORAL | 5 refills | Status: DC
Start: 2016-07-12 — End: 2017-01-22

## 2016-07-12 NOTE — Telephone Encounter (Signed)
Patient son called and stated that the patient needs a refill of her Januvia . Her pharmacy is [facility] [address]. The son states that you can contact him if we have  any concerns. His number is [phone] Please advise provider. Thanks

## 2016-07-12 NOTE — Telephone Encounter (Signed)
Sent rx refill in, thanks

## 2016-09-03 ENCOUNTER — Other Ambulatory Visit: Payer: Self-pay

## 2016-09-03 MED ORDER — ONETOUCH ULTRASOFT LANCETS MISC: 3 refills | Status: DC

## 2016-09-03 NOTE — Telephone Encounter (Signed)
Refilled medication

## 2016-09-05 ENCOUNTER — Telehealth: Payer: Self-pay | Admitting: *Deleted

## 2016-09-05 MED ORDER — GLUCOSE BLOOD VI STRP: ORAL_STRIP | 2 refills | Status: DC

## 2016-09-05 NOTE — Telephone Encounter (Signed)
Patient has requested a refill for one touch ultra test strip  Pharmacy [facility] [address]

## 2016-09-05 NOTE — Telephone Encounter (Signed)
Sent, thanks

## 2016-09-24 ENCOUNTER — Ambulatory Visit (INDEPENDENT_AMBULATORY_CARE_PROVIDER_SITE_OTHER): Payer: Medicare Other | Admitting: Family

## 2016-09-24 ENCOUNTER — Encounter: Payer: Self-pay | Admitting: Family

## 2016-09-24 VITALS — BP 116/81 | HR 75 | Temp 98.2°F | Ht 60.0 in | Wt 208.4 lb

## 2016-09-24 DIAGNOSIS — F419 Anxiety disorder, unspecified: Secondary | ICD-10-CM

## 2016-09-24 DIAGNOSIS — M7989 Other specified soft tissue disorders: Secondary | ICD-10-CM

## 2016-09-24 DIAGNOSIS — E039 Hypothyroidism, unspecified: Secondary | ICD-10-CM | POA: Diagnosis not present

## 2016-09-24 DIAGNOSIS — E785 Hyperlipidemia, unspecified: Secondary | ICD-10-CM

## 2016-09-24 DIAGNOSIS — F418 Other specified anxiety disorders: Secondary | ICD-10-CM

## 2016-09-24 DIAGNOSIS — Z23 Encounter for immunization: Secondary | ICD-10-CM | POA: Diagnosis not present

## 2016-09-24 DIAGNOSIS — F329 Major depressive disorder, single episode, unspecified: Secondary | ICD-10-CM

## 2016-09-24 LAB — MICROALBUMIN / CREATININE URINE RATIO
Creatinine,U: 45.5 mg/dL
Microalb Creat Ratio: 14.9 mg/g (ref 0.0–30.0)
Microalb, Ur: 6.8 mg/dL — ABNORMAL HIGH (ref 0.0–1.9)

## 2016-09-24 NOTE — Progress Notes (Signed)
Pre visit review using our clinic review tool, if applicable. No additional management support is needed unless otherwise documented below in the visit note. 

## 2016-09-24 NOTE — Assessment & Plan Note (Signed)
Stable. Continue current regimen. Pending a1c.

## 2016-09-24 NOTE — Progress Notes (Signed)
Subjective:    Patient ID: Alexandra English, female    DOB: 1927-11-02, 82 y.o.   MRN: [id number]  CC: Alexandra English is a 82 y.o. female who presents today for follow up.   HPI: Patient here for follow-up on chronic disease. SHe is also here to establish care. Accompanied by son.   Lives at [facility].   HTN- Checks BP at home, averages less than 120/85. Denies exertional chest pain or pressure, numbness or tingling radiating to left arm or jaw, palpitations, dizziness, frequent headaches, changes in vision, or shortness of breath.    BLe swelling-Improves with elevation. Endorses orthopnea. Son does not think mother takes lasix. He will check and let me know.   DM-on Celesta Gentile and glipizide. Checking BS , averaging less than 160, 180. No hypoglycemic episodes.   Low back pain and right shoulder ( right shoulder fracture precipitated CVA)- takes tramadol approx 2x week; takes tyelonol.   GERD- Stable.   Insomnia/ Depression- H/o anxiety which affects sleep. Has been stable on Remeron, atarax. 'She is a night owl.' Not drowsy on medications. Steady on feet and uses walker. Son states she better emotionally than she has been in a long time.   CVA- 5 years ago.     HISTORY:  Past Medical History:  Diagnosis Date  . Diabetes mellitus 04/2009   HgbA1c 7.1% 07/2009, Monofilament normal, Ortho pending  . Fracture    of rib  . Glaucoma    Dr. Thomasene Ripple  . Hypertension   . Sleep apnea    Per Pt. was supposed to start CPAP, however reluctant to try  . Stroke [facility])    Left frontal4/2010, Left parietal and right parietal 04/2011 , Started on Plavix and then changed to Coumadin by recommendation of Dr. Carlis Abbott  . Thyroid disease    hypothyroidism   Past Surgical History:  Procedure Laterality Date  . orthopedic surgeon     Dr. Latanya Maudlin   History reviewed. No pertinent family history.  Allergies: Beta adrenergic blockers; Cephalosporins; Lisinopril; Macrobid [nitrofurantoin  macrocrystal]; Penicillins; and Toprol xl [metoprolol succinate] Current Outpatient Prescriptions on File Prior to Visit  Medication Sig Dispense Refill  . amLODipine (NORVASC) 5 MG tablet TAKE ONE TABLET BY MOUTH ONCE DAILY 90 tablet 3  . aspirin 81 MG tablet Take 81 mg by mouth daily.      Marland Kitchen atorvastatin (LIPITOR) 40 MG tablet TAKE ONE TABLET BY MOUTH ONCE DAILY 90 tablet 3  . Blood Glucose Monitoring Suppl (ONE TOUCH ULTRA SYSTEM KIT) W/DEVICE KIT 1 kit by Does not apply route once. 1 each 0  . cloNIDine (CATAPRES) 0.2 MG tablet TAKE ONE TABLET BY MOUTH AT BEDTIME 90 tablet 3  . furosemide (LASIX) 20 MG tablet Take 1 tablet (20 mg total) by mouth daily. 30 tablet 3  . glipiZIDE (GLUCOTROL) 10 MG tablet TAKE ONE TABLET BY MOUTH TWICE DAILY BEFORE MEAL(S) 180 tablet 1  . glucose blood (ONE TOUCH ULTRA TEST) test strip USE ONE STRIP TO CHECK GLUCOSE TWICE DAILY E11.8 100 each 2  . hydrOXYzine (ATARAX/VISTARIL) 25 MG tablet Take 1 tablet (25 mg total) by mouth daily. 90 tablet 3  . Incontinence Supply Disposable (INCONTINENCE BRIEF LARGE) MISC Use as directed. 54 each 11  . Lancets (ONETOUCH ULTRASOFT) lancets Use as instructed 100 each 3  . levothyroxine (SYNTHROID, LEVOTHROID) 75 MCG tablet TAKE ONE TABLET BY MOUTH ONCE DAILY 90 tablet 3  . lidocaine (LIDODERM) 5 % Place 1 patch onto the skin  daily. Remove & Discard patch within 12 hours or as directed by MD 30 patch 0  . mirtazapine (REMERON) 30 MG tablet Take 1 tablet (30 mg total) by mouth at bedtime. 30 tablet 3  . nystatin cream (MYCOSTATIN) Apply 1 application topically 2 (two) times daily. 30 g 0  . omeprazole (PRILOSEC) 20 MG capsule TAKE ONE CAPSULE BY MOUTH ONCE DAILY 90 capsule 3  . sitaGLIPtin (JANUVIA) 25 MG tablet Take 1 tablet (25 mg total) by mouth daily. 30 tablet 5  . traMADol (ULTRAM) 50 MG tablet TAKE ONE TABLET BY MOUTH THREE TIMES DAILY AS NEEDED 90 tablet 0   No current facility-administered medications on file prior to  visit.     Social History  Substance Use Topics  . Smoking status: Never Smoker  . Smokeless tobacco: Never Used  . Alcohol use Not on file    Review of Systems  Constitutional: Negative for chills, fever and unexpected weight change.  Eyes: Negative for visual disturbance.  Respiratory: Negative for cough, shortness of breath and wheezing.   Cardiovascular: Positive for leg swelling. Negative for chest pain and palpitations.  Gastrointestinal: Negative for nausea and vomiting.  Genitourinary: Negative for dysuria.  Musculoskeletal: Positive for back pain.  Skin: Negative for rash.  Neurological: Negative for dizziness, syncope and headaches.  Psychiatric/Behavioral: The patient is not nervous/anxious.       Objective:    BP 116/81   Pulse 75   Temp 98.2 F (36.8 C) (Oral)   Ht 5' (1.524 m)   Wt 208 lb 6.4 oz (94.5 kg) Comment: patient was in her chair.  SpO2 97%   BMI 40.70 kg/m  BP Readings from Last 3 Encounters:  09/24/16 116/81  02/01/16 (!) 148/74  01/17/16 (!) 164/66   Wt Readings from Last 3 Encounters:  09/24/16 208 lb 6.4 oz (94.5 kg)  02/01/16 189 lb 8 oz (86 kg)  01/17/16 190 lb 8 oz (86.4 kg)    Physical Exam  Constitutional: She appears well-developed and well-nourished.  Eyes: Conjunctivae are normal.  Cardiovascular: Normal rate, regular rhythm, normal heart sounds and normal pulses.   BLE edema over ankle and dorsal aspect of foot. No palpable cords or masses. No erythema or increased warmth. No asymmetry in calf size when compared bilaterally LE warm and palpable pedal pulses.   Pulmonary/Chest: Effort normal and breath sounds normal. She has no wheezes. She has no rhonchi. She has no rales.  Neurological: She is alert.  Skin: Skin is warm and dry.  Psychiatric: She has a normal mood and affect. Her speech is normal and behavior is normal. Thought content normal.  Vitals reviewed.      Assessment & Plan:   Problem List Items Addressed  This Visit      Endocrine   Hypothyroidism     Other   Hyperlipidemia   Anxiety and depression    Stable. Continue current regimen.       Relevant Orders   TSH   VITAMIN D 25 Hydroxy (Vit-D Deficiency, Fractures)    Other Visit Diagnoses    Leg swelling    -  Primary   Relevant Orders   Ambulatory referral to Cardiology   CBC with Differential/Platelet   Lipid panel   Comprehensive metabolic panel   TSH   VITAMIN D 25 Hydroxy (Vit-D Deficiency, Fractures)   Hemoglobin A1c     Epic not flowing into chart:  HTN: stable continue current regimen.  DM: stable. Continue current regimen. Pending  a1c. H/o stroke: ON ASA. HTN and HLD controlled.  Anxiety and depression: controlled. Continue current regimen Edema: Chronic BLE. In context of orthopnea, referral placed to cardiology to evaluate for CHF and evaluated current HTN regimen. No adventitious lung sounds, shortness of breath to suggest acute exacerbation. No echo seen in chart. Unsure if patient is taking Lasix every day and son will let me know. Advised compression stockings and elevate legs.    I am having Ms. Kalil maintain her aspirin, Incontinence Brief Large, furosemide, ONE TOUCH ULTRA SYSTEM KIT, lidocaine, hydrOXYzine, traMADol, atorvastatin, cloNIDine, amLODipine, mirtazapine, nystatin cream, omeprazole, levothyroxine, glipiZIDE, sitaGLIPtin, onetouch ultrasoft, and glucose blood.   No orders of the defined types were placed in this encounter.   Return precautions given.   Risks, benefits, and alternatives of the medications and treatment plan prescribed today were discussed, and patient expressed understanding.   Education regarding symptom management and diagnosis given to patient on AVS.  Continue to follow with [name], FNP for routine health maintenance.   [name] and I agreed with plan.   [name], FNP

## 2016-09-24 NOTE — Assessment & Plan Note (Signed)
On ASA. HTN and HLD controlled.

## 2016-09-24 NOTE — Assessment & Plan Note (Signed)
Stable  Continue current regimen  

## 2016-09-24 NOTE — Assessment & Plan Note (Addendum)
Chronic BLE. In context of orthopnea, referral placed to cardiology to evaluate for CHF and evaluated current HTN regimen. No adventitious lung sounds, shortness of breath to suggest acute exacerbation. No echo seen in chart. Unsure if patient is taking Lasix every day and son will let me know. Advised compression stockings and elevate legs.

## 2016-09-24 NOTE — Patient Instructions (Signed)
Pleasure meeting you.   Peripheral Edema You have swelling in your legs (peripheral edema). This swelling is due to excess accumulation of salt and water in your body. Edema may be a sign of heart, kidney or liver disease, or a side effect of a medication. It may also be due to problems in the leg veins. Elevating your legs and using special support stockings may be very helpful, if the cause of the swelling is due to poor venous circulation. Avoid long periods of standing, whatever the cause. Treatment of edema depends on identifying the cause. Chips, pretzels, pickles and other salty foods should be avoided. Restricting salt in your diet is almost always needed. Water pills (diuretics) are often used to remove the excess salt and water from your body via urine. These medicines prevent the kidney from reabsorbing sodium. This increases urine flow. Diuretic treatment may also result in lowering of potassium levels in your body. Potassium supplements may be needed if you have to use diuretics daily. Daily weights can help you keep track of your progress in clearing your edema. You should call your caregiver for follow up care as recommended. SEEK IMMEDIATE MEDICAL CARE IF:   You have increased swelling, pain, redness, or heat in your legs.  You develop shortness of breath, especially when lying down.  You develop chest or abdominal pain, weakness, or fainting.  You have a fever.   This information is not intended to replace advice given to you by your health care provider. Make sure you discuss any questions you have with your health care provider.   Document Released: 01/09/2005 Document Revised: 02/24/2012 Document Reviewed: 06/14/2015 Elsevier Interactive Patient Education [employer]2016 Elsevier Inc.

## 2016-09-25 ENCOUNTER — Telehealth: Payer: Self-pay | Admitting: Family

## 2016-09-25 NOTE — Telephone Encounter (Signed)
Pt son called returning your call. Thank you!  Call [place] @ [phone]

## 2016-09-26 NOTE — Telephone Encounter (Signed)
Patients son was notified about results. Also he stated patient is no longer taking lasix.

## 2016-10-03 ENCOUNTER — Encounter: Payer: Self-pay | Admitting: Cardiology

## 2016-10-03 ENCOUNTER — Ambulatory Visit (INDEPENDENT_AMBULATORY_CARE_PROVIDER_SITE_OTHER): Payer: Medicare Other | Admitting: Cardiology

## 2016-10-03 ENCOUNTER — Other Ambulatory Visit (INDEPENDENT_AMBULATORY_CARE_PROVIDER_SITE_OTHER): Payer: Medicare Other

## 2016-10-03 VITALS — BP 144/60 | HR 76 | Ht 64.0 in | Wt 191.0 lb

## 2016-10-03 DIAGNOSIS — R0602 Shortness of breath: Secondary | ICD-10-CM | POA: Diagnosis not present

## 2016-10-03 DIAGNOSIS — F418 Other specified anxiety disorders: Secondary | ICD-10-CM | POA: Diagnosis not present

## 2016-10-03 DIAGNOSIS — I10 Essential (primary) hypertension: Secondary | ICD-10-CM

## 2016-10-03 DIAGNOSIS — M7989 Other specified soft tissue disorders: Secondary | ICD-10-CM

## 2016-10-03 DIAGNOSIS — F419 Anxiety disorder, unspecified: Secondary | ICD-10-CM

## 2016-10-03 DIAGNOSIS — E785 Hyperlipidemia, unspecified: Secondary | ICD-10-CM | POA: Diagnosis not present

## 2016-10-03 DIAGNOSIS — F329 Major depressive disorder, single episode, unspecified: Secondary | ICD-10-CM

## 2016-10-03 LAB — CBC WITH DIFFERENTIAL/PLATELET
BASOS ABS: 0.1 10*3/uL (ref 0.0–0.1)
Basophils Relative: 0.8 % (ref 0.0–3.0)
EOS ABS: 0.3 10*3/uL (ref 0.0–0.7)
Eosinophils Relative: 3.1 % (ref 0.0–5.0)
HCT: 40.6 % (ref 36.0–46.0)
Hemoglobin: 13.7 g/dL (ref 12.0–15.0)
LYMPHS ABS: 2.4 10*3/uL (ref 0.7–4.0)
Lymphocytes Relative: 22.2 % (ref 12.0–46.0)
MCHC: 33.7 g/dL (ref 30.0–36.0)
MCV: 89.4 fl (ref 78.0–100.0)
MONOS PCT: 8 % (ref 3.0–12.0)
Monocytes Absolute: 0.9 10*3/uL (ref 0.1–1.0)
NEUTROS ABS: 7.2 10*3/uL (ref 1.4–7.7)
NEUTROS PCT: 65.9 % (ref 43.0–77.0)
PLATELETS: 299 10*3/uL (ref 150.0–400.0)
RBC: 4.54 Mil/uL (ref 3.87–5.11)
RDW: 13.4 % (ref 11.5–15.5)
WBC: 10.9 10*3/uL — ABNORMAL HIGH (ref 4.0–10.5)

## 2016-10-03 LAB — COMPREHENSIVE METABOLIC PANEL
ALBUMIN: 4.2 g/dL (ref 3.5–5.2)
ALK PHOS: 105 U/L (ref 39–117)
ALT: 16 U/L (ref 0–35)
AST: 20 U/L (ref 0–37)
BILIRUBIN TOTAL: 0.4 mg/dL (ref 0.2–1.2)
BUN: 15 mg/dL (ref 6–23)
CALCIUM: 9.7 mg/dL (ref 8.4–10.5)
CO2: 26 mEq/L (ref 19–32)
Chloride: 102 mEq/L (ref 96–112)
Creatinine, Ser: 0.95 mg/dL (ref 0.40–1.20)
GFR: 59.15 mL/min — AB (ref >60.00)
Glucose, Bld: 175 mg/dL — ABNORMAL HIGH (ref 70–99)
POTASSIUM: 4.3 meq/L (ref 3.5–5.1)
Sodium: 137 mEq/L (ref 135–145)
TOTAL PROTEIN: 8 g/dL (ref 6.0–8.3)

## 2016-10-03 LAB — LIPID PANEL
CHOLESTEROL: 140 mg/dL (ref 0–200)
HDL: 44.1 mg/dL (ref >39.00)
LDL Cholesterol: 66 mg/dL (ref 0–99)
NonHDL: 96.33
TRIGLYCERIDES: 150 mg/dL — AB (ref 0.0–149.0)
Total CHOL/HDL Ratio: 3
VLDL: 30 mg/dL (ref 0.0–40.0)

## 2016-10-03 LAB — VITAMIN D 25 HYDROXY (VIT D DEFICIENCY, FRACTURES): VITD: 15.62 ng/mL — ABNORMAL LOW (ref 30.00–100.00)

## 2016-10-03 LAB — TSH: TSH: 3.04 u[IU]/mL (ref 0.35–4.50)

## 2016-10-03 LAB — HEMOGLOBIN A1C: HEMOGLOBIN A1C: 7.6 % — AB (ref 4.6–6.5)

## 2016-10-03 NOTE — Progress Notes (Signed)
Cardiology Office Note   Date:  10/03/2016   ID:  Alexandra English, DOB Aug 22, 1927, MRN [id number]  Referring Doctor:  [name], FNP   Cardiologist:   Wende Bushy, MD   Reason for consultation:  Chief Complaint  Patient presents with  . other    C/o edema and leg swelling. Meds reviewed verbally with pt.      History of Present Illness: Alexandra English is a 82 y.o. female who presents for Leg swelling  Leg swelling has been going on for several months now roughly 6-8 months, not quite progressive. Swelling noted to go up to mid calf levels. Patient usually sleeps in a recliner more for comfort and not shortness of breath. In terms of mobility, it is restricted due to patient having general frailty from previous strokes. She does try to avoid excessive salt in her diet. In terms of severity, they described the swelling is moderate, mild to moderate. She does not seem to be significantly impaired due to the swelling. Location: Bilateral lower extremities  Patient has limited functional capacity. She can walk some distance with her walker. Mainly, she is sedentary and uses a motorized scooter to get around. Minimal shortness of breath with some activity. No chest pain. No loss of consciousness. No palpitations.   ROS:  Please see the history of present illness. Aside from mentioned under HPI, all other systems are reviewed and negative.     Past Medical History:  Diagnosis Date  . Diabetes mellitus 04/2009   HgbA1c 7.1% 07/2009, Monofilament normal, Ortho pending  . Fracture    of rib  . Glaucoma    Dr. Thomasene Ripple  . Hypertension   . Sleep apnea    Per Pt. was supposed to start CPAP, however reluctant to try  . Stroke [facility])    Left frontal4/2010, Left parietal and right parietal 04/2011 , Started on Plavix and then changed to Coumadin by recommendation of Dr. Carlis Abbott  . Thyroid disease    hypothyroidism    Past Surgical History:  Procedure Laterality Date  .  orthopedic surgeon     Dr. Latanya Maudlin  . TOTAL ABDOMINAL HYSTERECTOMY    . TOTAL SHOULDER REPLACEMENT Right   . WRIST SURGERY Left      reports that she has never smoked. She has never used smokeless tobacco. She reports that she does not drink alcohol or use drugs.   Family history is unknown by patient.   Outpatient Medications Prior to Visit  Medication Sig Dispense Refill  . amLODipine (NORVASC) 5 MG tablet TAKE ONE TABLET BY MOUTH ONCE DAILY 90 tablet 3  . aspirin 81 MG tablet Take 81 mg by mouth daily.      Marland Kitchen atorvastatin (LIPITOR) 40 MG tablet TAKE ONE TABLET BY MOUTH ONCE DAILY 90 tablet 3  . Blood Glucose Monitoring Suppl (ONE TOUCH ULTRA SYSTEM KIT) W/DEVICE KIT 1 kit by Does not apply route once. 1 each 0  . cloNIDine (CATAPRES) 0.2 MG tablet TAKE ONE TABLET BY MOUTH AT BEDTIME 90 tablet 3  . glipiZIDE (GLUCOTROL) 10 MG tablet TAKE ONE TABLET BY MOUTH TWICE DAILY BEFORE MEAL(S) 180 tablet 1  . glucose blood (ONE TOUCH ULTRA TEST) test strip USE ONE STRIP TO CHECK GLUCOSE TWICE DAILY E11.8 100 each 2  . hydrOXYzine (ATARAX/VISTARIL) 25 MG tablet Take 1 tablet (25 mg total) by mouth daily. 90 tablet 3  . Incontinence Supply Disposable (INCONTINENCE BRIEF LARGE) MISC Use as directed. 54 each 11  .  Lancets (ONETOUCH ULTRASOFT) lancets Use as instructed 100 each 3  . levothyroxine (SYNTHROID, LEVOTHROID) 75 MCG tablet TAKE ONE TABLET BY MOUTH ONCE DAILY 90 tablet 3  . mirtazapine (REMERON) 30 MG tablet Take 1 tablet (30 mg total) by mouth at bedtime. 30 tablet 3  . omeprazole (PRILOSEC) 20 MG capsule TAKE ONE CAPSULE BY MOUTH ONCE DAILY 90 capsule 3  . sitaGLIPtin (JANUVIA) 25 MG tablet Take 1 tablet (25 mg total) by mouth daily. 30 tablet 5  . traMADol (ULTRAM) 50 MG tablet TAKE ONE TABLET BY MOUTH THREE TIMES DAILY AS NEEDED 90 tablet 0  . lidocaine (LIDODERM) 5 % Place 1 patch onto the skin daily. Remove & Discard patch within 12 hours or as directed by MD (Patient not taking:  Reported on 10/03/2016) 30 patch 0  . nystatin cream (MYCOSTATIN) Apply 1 application topically 2 (two) times daily. (Patient not taking: Reported on 10/03/2016) 30 g 0   No facility-administered medications prior to visit.      Allergies: Beta adrenergic blockers; Cephalosporins; Lisinopril; Macrobid [nitrofurantoin macrocrystal]; Penicillins; and Toprol xl [metoprolol succinate]    PHYSICAL EXAM: VS:  BP (!) 144/60 (BP Location: Right Arm, Patient Position: Sitting, Cuff Size: Normal)   Pulse 76   Ht 5' 4"  (1.626 m)   Wt 191 lb (86.6 kg)   BMI 32.79 kg/m  , Body mass index is 32.79 kg/m. Wt Readings from Last 3 Encounters:  10/03/16 191 lb (86.6 kg)  09/24/16 208 lb 6.4 oz (94.5 kg)  02/01/16 189 lb 8 oz (86 kg)    GENERAL:  well developed, well nourished, obese, not in acute distress HEENT: normocephalic, pink conjunctivae, anicteric sclerae, no xanthelasma, normal dentition, oropharynx clear NECK:  no neck vein engorgement, JVP normal, no hepatojugular reflux, carotid upstroke brisk and symmetric, no bruit, no thyromegaly, no lymphadenopathy LUNGS:  good respiratory effort, clear to auscultation bilaterally CV:  PMI not displaced, no thrills, no lifts, S1 and S2 within normal limits, no palpable S3 or S4, no murmurs, no rubs, no gallops ABD:  Soft, nontender, nondistended, normoactive bowel sounds, no abdominal aortic bruit, no hepatomegaly, no splenomegaly MS: nontender back, no kyphosis, no scoliosis, no joint deformities EXT:  2+ DP/PT pulses, ++ edema, no varicosities, no cyanosis, no clubbing SKIN: warm, nondiaphoretic, normal turgor, no ulcers NEUROPSYCH: alert, oriented to person, place, and time, sensory/motor grossly intact, normal mood, appropriate affect  Recent Labs: 01/17/2016: ALT 17; BUN 13; Creatinine, Ser 0.85; Potassium 4.1; Sodium 136   Lipid Panel    Component Value Date/Time   CHOL 159 10/02/2015 1459   TRIG 209.0 (H) 10/02/2015 1459   HDL 44.50  10/02/2015 1459   CHOLHDL 4 10/02/2015 1459   VLDL 41.8 (H) 10/02/2015 1459   LDLCALC 68 08/19/2014 1155   LDLDIRECT 90.0 10/02/2015 1459     Other studies Reviewed:  EKG:  The ekg from 10/03/2016 was personally reviewed by me and it revealed sinus rhythm, 76 BPM, nonspecific ST T wave changes.  Additional studies/ records that were reviewed personally reviewed by me today include: None available   ASSESSMENT AND PLAN:  Edema Shortness of breath, minimal  Edema is likely dependent in nature. Patient has limited mobility due to previous stroke, relying on motorized scooter most of the 4 going around. She also sleeps in a recliner and does not sleep on the bed. Per patient, she is not significantly impaired by the edema. No pain noted. She used to be on Lasix but has encountered problems  with it and was then stopped. She cannot recall if there were related to her kidney function or electrolyte abnormalities. At this point, she is not inclined to start any diuretics. Recommend echocardiogram to rule out systolic congestive heart failure. Recommend compression stockings, increasing mobility as tolerated, sodium restriction. We will also rule out DVTs with bilateral LEV's.  Hypertension BP is well controlled. Continue monitoring BP. Continue current medical therapy and lifestyle changes.  Hyperlipidemia Patient on statin therapy, PCP following labs. Recommended ideal LDL goal is less than 70 due to diabetes.  Current medicines are reviewed at length with the patient today.  The patient does not have concerns regarding medicines.  Labs/ tests ordered today include:  Orders Placed This Encounter  Procedures  . EKG 12-Lead  . ECHOCARDIOGRAM COMPLETE    I had a lengthy and detailed discussion with the patient regarding diagnoses, prognosis, diagnostic options, treatment options , and side effects of medications.   I counseled the patient on importance of lifestyle modification  including heart healthy diet, regular physical activity , and smoking cessation.   Disposition:   FU with undersigned after tests   Signed, Wende Bushy, MD  10/03/2016 2:28 PM    [place]  This note was generated in part with voice recognition software and I apologize for any typographical errors that were not detected and corrected.

## 2016-10-03 NOTE — Patient Instructions (Addendum)
Testing/Procedures: Your physician has requested that you have an echocardiogram. Echocardiography is a painless test that uses sound waves to create images of your heart. It provides your doctor with information about the size and shape of your heart and how well your heart's chambers and valves are working. This procedure takes approximately one hour. There are no restrictions for this procedure.  Your physician has requested that you have a lower or upper extremity venous duplex. This test is an ultrasound of the veins in the legs or arms. It looks at venous blood flow that carries blood from the heart to the legs or arms. Allow one hour for a Lower Venous exam. Allow thirty minutes for an Upper Venous exam. There are no restrictions or special instructions.    Follow-Up: Your physician recommends that you schedule a follow-up appointment after testing with Dr. Alvino ChapelIngal.   It was a pleasure seeing you today here in the office. Please do not hesitate to give us a call back if you have any further questions. [phone]  Rincon CellarPamela A. RN, BSN

## 2016-10-16 ENCOUNTER — Other Ambulatory Visit: Payer: Self-pay

## 2016-10-16 MED ORDER — HYDROXYZINE HCL 25 MG PO TABS: 25.0000 mg | ORAL_TABLET | Freq: Every day | ORAL | 3 refills | Status: DC

## 2016-10-17 ENCOUNTER — Other Ambulatory Visit: Payer: Self-pay

## 2016-10-17 DIAGNOSIS — F329 Major depressive disorder, single episode, unspecified: Secondary | ICD-10-CM

## 2016-10-17 DIAGNOSIS — F419 Anxiety disorder, unspecified: Principal | ICD-10-CM

## 2016-10-17 MED ORDER — MIRTAZAPINE 30 MG PO TABS: 30.0000 mg | ORAL_TABLET | Freq: Every day | ORAL | 3 refills | Status: DC

## 2016-10-17 NOTE — Telephone Encounter (Signed)
Medication has been refilled.

## 2016-10-18 ENCOUNTER — Emergency Department: Payer: Medicare Other

## 2016-10-18 ENCOUNTER — Observation Stay
Admission: EM | Admit: 2016-10-18 | Discharge: 2016-10-20 | Disposition: A | Discharge status: Home / Self Care | Payer: Medicare Other | Attending: Internal Medicine | Admitting: Internal Medicine

## 2016-10-18 DIAGNOSIS — E1142 Type 2 diabetes mellitus with diabetic polyneuropathy: Secondary | ICD-10-CM | POA: Diagnosis not present

## 2016-10-18 DIAGNOSIS — M549 Dorsalgia, unspecified: Secondary | ICD-10-CM | POA: Diagnosis not present

## 2016-10-18 DIAGNOSIS — R600 Localized edema: Secondary | ICD-10-CM | POA: Insufficient documentation

## 2016-10-18 DIAGNOSIS — Z7984 Long term (current) use of oral hypoglycemic drugs: Secondary | ICD-10-CM | POA: Insufficient documentation

## 2016-10-18 DIAGNOSIS — Z7901 Long term (current) use of anticoagulants: Secondary | ICD-10-CM | POA: Diagnosis not present

## 2016-10-18 DIAGNOSIS — R079 Chest pain, unspecified: Secondary | ICD-10-CM | POA: Diagnosis not present

## 2016-10-18 DIAGNOSIS — W19XXXA Unspecified fall, initial encounter: Secondary | ICD-10-CM | POA: Diagnosis not present

## 2016-10-18 DIAGNOSIS — R55 Syncope and collapse: Secondary | ICD-10-CM | POA: Diagnosis not present

## 2016-10-18 DIAGNOSIS — N3000 Acute cystitis without hematuria: Secondary | ICD-10-CM | POA: Diagnosis not present

## 2016-10-18 DIAGNOSIS — E785 Hyperlipidemia, unspecified: Secondary | ICD-10-CM | POA: Diagnosis not present

## 2016-10-18 DIAGNOSIS — I679 Cerebrovascular disease, unspecified: Secondary | ICD-10-CM | POA: Insufficient documentation

## 2016-10-18 DIAGNOSIS — I10 Essential (primary) hypertension: Secondary | ICD-10-CM | POA: Insufficient documentation

## 2016-10-18 DIAGNOSIS — Z79899 Other long term (current) drug therapy: Secondary | ICD-10-CM | POA: Insufficient documentation

## 2016-10-18 DIAGNOSIS — R609 Edema, unspecified: Secondary | ICD-10-CM

## 2016-10-18 DIAGNOSIS — R109 Unspecified abdominal pain: Secondary | ICD-10-CM | POA: Insufficient documentation

## 2016-10-18 DIAGNOSIS — S79912A Unspecified injury of left hip, initial encounter: Secondary | ICD-10-CM | POA: Diagnosis not present

## 2016-10-18 DIAGNOSIS — M545 Low back pain: Secondary | ICD-10-CM | POA: Diagnosis not present

## 2016-10-18 DIAGNOSIS — G8191 Hemiplegia, unspecified affecting right dominant side: Secondary | ICD-10-CM | POA: Diagnosis not present

## 2016-10-18 DIAGNOSIS — E039 Hypothyroidism, unspecified: Secondary | ICD-10-CM | POA: Insufficient documentation

## 2016-10-18 DIAGNOSIS — M25552 Pain in left hip: Secondary | ICD-10-CM | POA: Diagnosis not present

## 2016-10-18 DIAGNOSIS — G4730 Sleep apnea, unspecified: Secondary | ICD-10-CM | POA: Insufficient documentation

## 2016-10-18 DIAGNOSIS — S3992XA Unspecified injury of lower back, initial encounter: Secondary | ICD-10-CM | POA: Diagnosis not present

## 2016-10-18 DIAGNOSIS — Z7982 Long term (current) use of aspirin: Secondary | ICD-10-CM | POA: Diagnosis not present

## 2016-10-18 DIAGNOSIS — R51 Headache: Secondary | ICD-10-CM | POA: Diagnosis not present

## 2016-10-18 LAB — CBC
HCT: 38.3 % (ref 35.0–47.0)
Hemoglobin: 13.2 g/dL (ref 12.0–16.0)
MCH: 30.7 pg (ref 26.0–34.0)
MCHC: 34.3 g/dL (ref 32.0–36.0)
MCV: 89.5 fL (ref 80.0–100.0)
PLATELETS: 247 10*3/uL (ref 150–440)
RBC: 4.28 MIL/uL (ref 3.80–5.20)
RDW: 13 % (ref 11.5–14.5)
WBC: 19.1 10*3/uL — AB (ref 3.6–11.0)

## 2016-10-18 NOTE — ED Notes (Signed)
Dr. Fanny English is aware that pt is feeling nauseated.

## 2016-10-18 NOTE — ED Provider Notes (Signed)
[facility] Emergency Department Provider Note    First MD Initiated Contact with Patient 10/18/16 2315     (approximate)  I have reviewed the triage vital signs and the nursing notes.   HISTORY  Chief Complaint Fall   HPI Alexandra English is a 82 y.o. female with below listed chronic medical conditions presents to the emergency department with falling at home while she states that she was making (crackers. Patient denies any preceding symptoms no chest pain no headache no nausea no vomiting no dizziness. Patient unsure if she tripped and fell or abruptly lost consciousness. Patient is not complaining of 7 out of 10 in occipital headache as well as lumbar pain. Patient also admits to left hip discomfort.   Past Medical History:  Diagnosis Date  . Diabetes mellitus 04/2009   HgbA1c 7.1% 07/2009, Monofilament normal, Ortho pending  . Fracture    of rib  . Glaucoma    Dr. Thomasene Ripple  . Hypertension   . Sleep apnea    Per Pt. was supposed to start CPAP, however reluctant to try  . Stroke [facility])    Left frontal4/2010, Left parietal and right parietal 04/2011 , Started on Plavix and then changed to Coumadin by recommendation of Dr. Carlis Abbott  . Thyroid disease    hypothyroidism    Patient Active Problem List   Diagnosis Date Noted  . Dysuria 01/17/2016  . Candidal dermatitis 01/17/2016  . Back pain 06/27/2015  . Diabetic polyneuropathy associated with type 2 diabetes mellitus ([place]) 08/19/2014  . Edema 06/04/2013  . Pre-ulcerative corn or callous 01/13/2013  . Anxiety and depression 05/12/2012  . Arthralgia 01/09/2012  . Gait instability 12/02/2011  . Hypertension 09/11/2011  . Hyperlipidemia 09/11/2011  . History of stroke 09/11/2011  . DM (diabetes mellitus), type 2 with complications ([place]) 20/94/7096  . Hypothyroidism 09/11/2011    Past Surgical History:  Procedure Laterality Date  . orthopedic surgeon     Dr. Latanya Maudlin  . TOTAL ABDOMINAL  HYSTERECTOMY    . TOTAL SHOULDER REPLACEMENT Right   . WRIST SURGERY Left     Prior to Admission medications   Medication Sig Start Date End Date Taking? Authorizing Provider  amLODipine (NORVASC) 5 MG tablet TAKE ONE TABLET BY MOUTH ONCE DAILY 01/15/16   Jackolyn Confer, MD  aspirin 81 MG tablet Take 81 mg by mouth daily.      Historical Provider, MD  atorvastatin (LIPITOR) 40 MG tablet TAKE ONE TABLET BY MOUTH ONCE DAILY 01/15/16   Jackolyn Confer, MD  Blood Glucose Monitoring Suppl (ONE TOUCH ULTRA SYSTEM KIT) W/DEVICE KIT 1 kit by Does not apply route once. 08/19/14   Jackolyn Confer, MD  cloNIDine (CATAPRES) 0.2 MG tablet TAKE ONE TABLET BY MOUTH AT BEDTIME 01/15/16   Jackolyn Confer, MD  glipiZIDE (GLUCOTROL) 10 MG tablet TAKE ONE TABLET BY MOUTH TWICE DAILY BEFORE MEAL(S) 06/10/16   Jackolyn Confer, MD  glucose blood (ONE TOUCH ULTRA TEST) test strip USE ONE STRIP TO CHECK GLUCOSE TWICE DAILY E11.8 09/05/16   Coral Spikes, DO  hydrOXYzine (ATARAX/VISTARIL) 25 MG tablet Take 1 tablet (25 mg total) by mouth daily. 10/16/16   Burnard Hawthorne, FNP  Incontinence Supply Disposable (INCONTINENCE BRIEF LARGE) MISC Use as directed. 02/18/14   Jackolyn Confer, MD  Lancets [facility] ULTRASOFT) lancets Use as instructed 09/03/16   Coral Spikes, DO  levothyroxine (SYNTHROID, LEVOTHROID) 75 MCG tablet TAKE ONE TABLET BY MOUTH ONCE DAILY  01/29/16   Jackolyn Confer, MD  mirtazapine (REMERON) 30 MG tablet Take 1 tablet (30 mg total) by mouth at bedtime. 10/17/16   Burnard Hawthorne, FNP  omeprazole (PRILOSEC) 20 MG capsule TAKE ONE CAPSULE BY MOUTH ONCE DAILY 01/29/16   Jackolyn Confer, MD  sitaGLIPtin (JANUVIA) 25 MG tablet Take 1 tablet (25 mg total) by mouth daily. 07/12/16   Jackolyn Confer, MD  traMADol (ULTRAM) 50 MG tablet TAKE ONE TABLET BY MOUTH THREE TIMES DAILY AS NEEDED 12/06/15   Jackolyn Confer, MD    Allergies Beta adrenergic blockers; Cephalosporins; Lisinopril; Macrobid  [nitrofurantoin macrocrystal]; Penicillins; and Toprol xl [metoprolol succinate]  Family History  Problem Relation Age of Onset  . Family history unknown: Yes    Social History Social History  Substance Use Topics  . Smoking status: Never Smoker  . Smokeless tobacco: Never Used  . Alcohol use No    Review of Systems Constitutional: No fever/chills Eyes: No visual changes. ENT: No sore throat. Cardiovascular: Denies chest pain. Respiratory: Denies shortness of breath. Gastrointestinal: No abdominal pain.  No nausea, no vomiting.  No diarrhea.  No constipation. Genitourinary: Negative for dysuria. Musculoskeletal: Positive for back pain. Skin: Negative for rash. Neurological: Positive for for headaches, negative for focal weakness or numbness.  10-point ROS otherwise negative.  ____________________________________________   PHYSICAL EXAM:  VITAL SIGNS: ED Triage Vitals  Enc Vitals Group     BP 10/18/16 2149 131/64     Pulse Rate 10/18/16 2149 87     Resp 10/18/16 2149 20     Temp 10/18/16 2149 97.8 F (36.6 C)     Temp Source 10/18/16 2149 Oral     SpO2 10/18/16 2149 97 %     Weight 10/18/16 2151 190 lb (86.2 kg)     Height 10/18/16 2151 '5\' 5"'$  (1.651 m)     Head Circumference --      Peak Flow --      Pain Score 10/18/16 2152 7     Pain Loc --      Pain Edu? --      Excl. in [place]? --     Constitutional: Alert and oriented. Well appearing and in no acute distress. Eyes: Conjunctivae are normal. PERRL. EOMI. Head: Atraumatic. Ears:  Healthy appearing ear canals and TMs bilaterally Nose: No congestion/rhinnorhea. Mouth/Throat: Mucous membranes are moist.  Oropharynx non-erythematous. Neck: No stridor.  No meningeal signs.  No cervical spine tenderness to palpation. Cardiovascular: Normal rate, regular rhythm. Good peripheral circulation. Grossly normal heart sounds. Respiratory: Normal respiratory effort.  No retractions. Lungs CTAB. Gastrointestinal: Soft and  nontender. No distention.  Musculoskeletal: 1+ bilateral lower extremity pitting edema No gross deformities of extremities. Neurologic:  Normal speech and language. No gross focal neurologic deficits are appreciated.  Skin:  Skin is warm, dry and intact. No rash noted. Psychiatric: Mood and affect are normal. Speech and behavior are normal.  ____________________________________________   LABS (all labs ordered are listed, but only abnormal results are displayed)  Labs Reviewed  BASIC METABOLIC PANEL  CBC  TROPONIN I   ____________________________________________  EKG  ED ECG REPORT I, [place], the attending physician, personally viewed and interpreted this ECG.   Date: 10/18/2016  EKG Time: 9:50 PM   Rate: 84  Rhythm: Normal sinus rhythm  Axis: Normal  Intervals: Normal  ST&T Change: None  ____________________________________________  RADIOLOGY I, [place] Penina Reisner, personally viewed and evaluated these images (plain radiographs) as part of my  medical decision making, as well as reviewing the written report by the radiologist.  Ct Head Wo Contrast  Result Date: 10/18/2016 CLINICAL DATA:  Trip and fall versus syncopal episode. Hyperglycemic. Headache, posterior head pain. History of hypertension, diabetes and strokes. EXAM: CT HEAD WITHOUT CONTRAST TECHNIQUE: Contiguous axial images were obtained from the base of the skull through the vertex without intravenous contrast. COMPARISON:  CT HEAD Apr 18, 2014 FINDINGS: BRAIN: Unchanged moderate to severe ventriculomegaly with disproportionate sulcal effacement at the convexities. Old bilateral basal ganglia infarcts. LEFT parietal encephalomalacia. No intraparenchymal hemorrhage, mass effect, midline shift or acute large vascular territory infarcts. Confluent supratentorial white matter hypodensities compatible with chronic small vessel ischemic disease. No abnormal extra-axial fluid collections. Basal cisterns are patent.  VASCULAR: Moderate calcific atherosclerosis of the carotid siphons. SKULL: No skull fracture. No significant scalp soft tissue swelling. SINUSES/ORBITS: The mastoid air-cells and included paranasal sinuses are well-aerated.The included ocular globes and orbital contents are non-suspicious. OTHER: None. IMPRESSION: No acute intracranial process. Stable examination including evidence of normal pressure hydrocephalus, old bilateral basal ganglia and LEFT parietal lobe infarcts. Electronically Signed   By: Elon Alas M.D.   On: 10/18/2016 22:51     Procedures    INITIAL IMPRESSION / ASSESSMENT AND PLAN / ED COURSE  Pertinent labs & imaging results that were available during my care of the patient were reviewed by me and considered in my medical decision making (see chart for details).  Given history and physical exam of syncope bilateral lower extremity edema which is new in onset urinary tract infection patient discussed with Dr. Marcille Blanco for hospital admission for further evaluation and management.   Clinical Course    ____________________________________________  FINAL CLINICAL IMPRESSION(S) / ED DIAGNOSES  Final diagnoses:  Acute cystitis without hematuria  Syncope, unspecified syncope type  Peripheral edema     MEDICATIONS GIVEN DURING THIS VISIT:  Medications - No data to display   NEW OUTPATIENT MEDICATIONS STARTED DURING THIS VISIT:  New Prescriptions   No medications on file    Modified Medications   No medications on file    Discontinued Medications   No medications on file     Note:  This document was prepared using Dragon voice recognition software and may include unintentional dictation errors.    Gregor Hams, MD 10/19/16 [phone]

## 2016-10-18 NOTE — ED Triage Notes (Signed)
Pt's son called in a fall to EMS. BG 200 for EMS. Per EMS VS WNL . Pt stating that she is unsure if she had a trip and fall or syncope. Pt c/o HA , posterior head pain, and left hip pain radiating to pubic. Pt aaox4. Pt denying any use of blood thinners besides a baby asa a day.

## 2016-10-19 ENCOUNTER — Observation Stay: Payer: Medicare Other

## 2016-10-19 ENCOUNTER — Observation Stay (HOSPITAL_BASED_OUTPATIENT_CLINIC_OR_DEPARTMENT_OTHER)
Admit: 2016-10-19 | Discharge: 2016-10-19 | Disposition: A | Discharge status: Home / Self Care | Payer: Medicare Other | Attending: Internal Medicine | Admitting: Internal Medicine

## 2016-10-19 ENCOUNTER — Encounter: Payer: Self-pay | Admitting: Internal Medicine

## 2016-10-19 DIAGNOSIS — R55 Syncope and collapse: Secondary | ICD-10-CM

## 2016-10-19 DIAGNOSIS — I6523 Occlusion and stenosis of bilateral carotid arteries: Secondary | ICD-10-CM | POA: Diagnosis not present

## 2016-10-19 DIAGNOSIS — I10 Essential (primary) hypertension: Secondary | ICD-10-CM | POA: Diagnosis not present

## 2016-10-19 DIAGNOSIS — M25552 Pain in left hip: Secondary | ICD-10-CM | POA: Diagnosis not present

## 2016-10-19 DIAGNOSIS — E039 Hypothyroidism, unspecified: Secondary | ICD-10-CM | POA: Diagnosis not present

## 2016-10-19 DIAGNOSIS — S79912A Unspecified injury of left hip, initial encounter: Secondary | ICD-10-CM | POA: Diagnosis not present

## 2016-10-19 DIAGNOSIS — E119 Type 2 diabetes mellitus without complications: Secondary | ICD-10-CM | POA: Diagnosis not present

## 2016-10-19 DIAGNOSIS — R079 Chest pain, unspecified: Secondary | ICD-10-CM | POA: Diagnosis not present

## 2016-10-19 DIAGNOSIS — S3992XA Unspecified injury of lower back, initial encounter: Secondary | ICD-10-CM | POA: Diagnosis not present

## 2016-10-19 DIAGNOSIS — M545 Low back pain: Secondary | ICD-10-CM | POA: Diagnosis not present

## 2016-10-19 LAB — MRSA PCR SCREENING: MRSA BY PCR: NEGATIVE

## 2016-10-19 LAB — URINALYSIS COMPLETE WITH MICROSCOPIC (ARMC ONLY)
BILIRUBIN URINE: NEGATIVE
GLUCOSE, UA: 50 mg/dL — AB
Hgb urine dipstick: NEGATIVE
KETONES UR: NEGATIVE mg/dL
Nitrite: NEGATIVE
Protein, ur: NEGATIVE mg/dL
Specific Gravity, Urine: 1.004 — ABNORMAL LOW (ref 1.005–1.030)
pH: 7 (ref 5.0–8.0)

## 2016-10-19 LAB — BASIC METABOLIC PANEL
Anion gap: 9 (ref 5–15)
BUN: 15 mg/dL (ref 6–20)
CALCIUM: 9.1 mg/dL (ref 8.9–10.3)
CO2: 25 mmol/L (ref 22–32)
Chloride: 99 mmol/L — ABNORMAL LOW (ref 101–111)
Creatinine, Ser: 0.92 mg/dL (ref 0.44–1.00)
GFR calc Af Amer: >60 mL/min (ref >60)
GFR, EST NON AFRICAN AMERICAN: 55 mL/min — AB (ref >60)
GLUCOSE: 220 mg/dL — AB (ref 65–99)
Potassium: 3.9 mmol/L (ref 3.5–5.1)
SODIUM: 133 mmol/L — AB (ref 135–145)

## 2016-10-19 LAB — GLUCOSE, CAPILLARY
Glucose-Capillary: 146 mg/dL — ABNORMAL HIGH (ref 65–99)
Glucose-Capillary: 199 mg/dL — ABNORMAL HIGH (ref 65–99)
Glucose-Capillary: 219 mg/dL — ABNORMAL HIGH (ref 65–99)
Glucose-Capillary: 127 mg/dL — ABNORMAL HIGH (ref 65–99)
Glucose-Capillary: 216 mg/dL — ABNORMAL HIGH (ref 65–99)

## 2016-10-19 LAB — ECHOCARDIOGRAM COMPLETE
Height: 65 in
Weight: 2956.8 oz

## 2016-10-19 LAB — TSH: TSH: 1.644 u[IU]/mL (ref 0.350–4.500)

## 2016-10-19 LAB — TROPONIN I

## 2016-10-19 MED ORDER — AMLODIPINE BESYLATE 5 MG PO TABS
5.0000 mg | ORAL_TABLET | Freq: Every day | ORAL | Status: DC
  Administered 2016-10-19 – 2016-10-20 (×2): 5 mg via ORAL
  Filled 2016-10-19 (×2): qty 1

## 2016-10-19 MED ORDER — LINAGLIPTIN 5 MG PO TABS
5.0000 mg | ORAL_TABLET | Freq: Every day | ORAL | Status: DC
  Administered 2016-10-19 – 2016-10-20 (×2): 5 mg via ORAL
  Filled 2016-10-19 (×2): qty 1

## 2016-10-19 MED ORDER — ACETAMINOPHEN 650 MG RE SUPP: 650.0000 mg | Freq: Four times a day (QID) | RECTAL | Status: DC | PRN

## 2016-10-19 MED ORDER — SODIUM CHLORIDE 0.9% FLUSH
3.0000 mL | Freq: Two times a day (BID) | INTRAVENOUS | Status: DC
  Administered 2016-10-19 (×2): 3 mL via INTRAVENOUS

## 2016-10-19 MED ORDER — ENOXAPARIN SODIUM 40 MG/0.4ML ~~LOC~~ SOLN: 40.0000 mg | SUBCUTANEOUS | Status: DC

## 2016-10-19 MED ORDER — MIRTAZAPINE 15 MG PO TABS
30.0000 mg | ORAL_TABLET | Freq: Every day | ORAL | Status: DC
  Administered 2016-10-19: 30 mg via ORAL
  Filled 2016-10-19: qty 2

## 2016-10-19 MED ORDER — ATORVASTATIN CALCIUM 20 MG PO TABS
40.0000 mg | ORAL_TABLET | Freq: Every day | ORAL | Status: DC
  Administered 2016-10-19: 40 mg via ORAL
  Filled 2016-10-19: qty 2

## 2016-10-19 MED ORDER — INSULIN ASPART 100 UNIT/ML ~~LOC~~ SOLN
0.0000 [IU] | Freq: Three times a day (TID) | SUBCUTANEOUS | Status: DC
  Administered 2016-10-19: 3 [IU] via SUBCUTANEOUS
  Administered 2016-10-20: 2 [IU] via SUBCUTANEOUS
  Administered 2016-10-20: 1 [IU] via SUBCUTANEOUS
  Filled 2016-10-19: qty 1
  Filled 2016-10-19 (×2): qty 2
  Filled 2016-10-19: qty 3

## 2016-10-19 MED ORDER — CEFTRIAXONE SODIUM-DEXTROSE 1-3.74 GM-% IV SOLR
1.0000 g | INTRAVENOUS | Status: DC
  Administered 2016-10-19 – 2016-10-20 (×2): 1 g via INTRAVENOUS
  Filled 2016-10-19 (×2): qty 50

## 2016-10-19 MED ORDER — ONDANSETRON HCL 4 MG/2ML IJ SOLN: 4.0000 mg | Freq: Four times a day (QID) | INTRAMUSCULAR | Status: DC | PRN

## 2016-10-19 MED ORDER — CIPROFLOXACIN IN D5W 400 MG/200ML IV SOLN
400.0000 mg | Freq: Once | INTRAVENOUS | Status: AC
  Administered 2016-10-19: 400 mg via INTRAVENOUS
  Filled 2016-10-19: qty 200

## 2016-10-19 MED ORDER — ASPIRIN 81 MG PO CHEW
81.0000 mg | CHEWABLE_TABLET | Freq: Every day | ORAL | Status: DC
  Administered 2016-10-19 – 2016-10-20 (×2): 81 mg via ORAL
  Filled 2016-10-19 (×2): qty 1

## 2016-10-19 MED ORDER — LEVOTHYROXINE SODIUM 75 MCG PO TABS
75.0000 ug | ORAL_TABLET | ORAL | Status: DC
  Administered 2016-10-19 – 2016-10-20 (×2): 75 ug via ORAL
  Filled 2016-10-19 (×2): qty 1

## 2016-10-19 MED ORDER — DOCUSATE SODIUM 100 MG PO CAPS
100.0000 mg | ORAL_CAPSULE | Freq: Two times a day (BID) | ORAL | Status: DC
  Administered 2016-10-19 – 2016-10-20 (×3): 100 mg via ORAL
  Filled 2016-10-19 (×3): qty 1

## 2016-10-19 MED ORDER — CLONIDINE HCL 0.1 MG PO TABS
0.2000 mg | ORAL_TABLET | Freq: Every day | ORAL | Status: DC
  Administered 2016-10-19: 0.2 mg via ORAL
  Filled 2016-10-19: qty 2

## 2016-10-19 MED ORDER — ONETOUCH ULTRA SYSTEM W/DEVICE KIT: 1.0000 | PACK | Freq: Once | Status: DC

## 2016-10-19 MED ORDER — METAXALONE 800 MG PO TABS
800.0000 mg | ORAL_TABLET | Freq: Three times a day (TID) | ORAL | Status: AC
  Administered 2016-10-19 – 2016-10-20 (×4): 800 mg via ORAL
  Filled 2016-10-19 (×4): qty 1

## 2016-10-19 MED ORDER — ACETAMINOPHEN 325 MG PO TABS: 650.0000 mg | ORAL_TABLET | Freq: Four times a day (QID) | ORAL | Status: DC | PRN

## 2016-10-19 MED ORDER — PANTOPRAZOLE SODIUM 40 MG PO TBEC
40.0000 mg | DELAYED_RELEASE_TABLET | Freq: Every day | ORAL | Status: DC
  Administered 2016-10-19 – 2016-10-20 (×2): 40 mg via ORAL
  Filled 2016-10-19 (×2): qty 1

## 2016-10-19 MED ORDER — TRAMADOL HCL 50 MG PO TABS: 50.0000 mg | ORAL_TABLET | Freq: Three times a day (TID) | ORAL | Status: DC | PRN

## 2016-10-19 MED ORDER — ONDANSETRON HCL 4 MG PO TABS: 4.0000 mg | ORAL_TABLET | Freq: Four times a day (QID) | ORAL | Status: DC | PRN

## 2016-10-19 MED ORDER — DEXTROSE 5 % IV SOLN: 1.0000 g | Freq: Once | INTRAVENOUS | Status: DC

## 2016-10-19 MED ORDER — HYDROXYZINE HCL 10 MG PO TABS
25.0000 mg | ORAL_TABLET | Freq: Every day | ORAL | Status: DC
  Administered 2016-10-19 – 2016-10-20 (×2): 25 mg via ORAL
  Filled 2016-10-19 (×2): qty 3

## 2016-10-19 MED ORDER — INSULIN ASPART 100 UNIT/ML ~~LOC~~ SOLN
0.0000 [IU] | Freq: Every day | SUBCUTANEOUS | Status: DC
Start: 2016-10-19 — End: 2016-10-20
  Administered 2016-10-19: 2 [IU] via SUBCUTANEOUS
  Filled 2016-10-19: qty 2

## 2016-10-19 MED ORDER — SODIUM CHLORIDE 0.9 % IV SOLN
INTRAVENOUS | Status: DC
  Administered 2016-10-19 – 2016-10-20 (×2): via INTRAVENOUS

## 2016-10-19 MED ORDER — SULFAMETHOXAZOLE-TRIMETHOPRIM 800-160 MG PO TABS: 1.0000 | ORAL_TABLET | Freq: Two times a day (BID) | ORAL | Status: DC

## 2016-10-19 NOTE — ED Notes (Signed)
Pt's son Laddie AquasBarry Glastetter for any change in pt status. [phone]. Pt gave permission

## 2016-10-19 NOTE — Progress Notes (Signed)
Pharmacy Antibiotic Note  [name] is a 82 y.o. female admitted on 10/18/2016 with UTI.  Pharmacy has been consulted for ceftriaxone dosing.  Plan: Ordered Ceftriaxone 1g IV every 24 hours.    Height: 5\' 5"  (165.1 cm) Weight: 184 lb 12.8 oz (83.8 kg) IBW/kg (Calculated) : 57  Temp (24hrs), Avg:98.1 F (36.7 C), Min:97.8 F (36.6 C), Max:98.3 F (36.8 C)   Recent Labs Lab 10/18/16 2339  WBC 19.1*  CREATININE 0.92    Estimated Creatinine Clearance: 46.9 mL/min (by C-G formula based on SCr of 0.92 mg/dL).    Allergies  Allergen Reactions  . Beta Adrenergic Blockers   . Cephalosporins   . Lisinopril   . Macrobid [Nitrofurantoin Macrocrystal]   . Penicillins   . Toprol Xl [Metoprolol Succinate]     Antimicrobials this admission: Ciprofloxacin 11/4 >> x 1 dose Ceftriaxone  11/4 >>   Dose adjustments this admission: N/A  Microbiology results: 11/4 UCx: pending   Thank you for allowing pharmacy to be a part of this patient's care.  Stormy CardKatsoudas,Jayah Balthazar K, RPh Clinical Pharmacist 10/19/2016 8:56 AM

## 2016-10-19 NOTE — Evaluation (Signed)
Physical Therapy Evaluation Patient Details Name: Alexandra English MRN: [id number] DOB: 04/16/1927 Today's Date: 10/19/2016   History of Present Illness  82 yo female with onset of syncope, UTI and AMS after which she was admitted.  PMHx:  DM, HTN, glaucoma, CVD, LE edema, stroke, fall L side with hx of rib fractures  Clinical Impression  [facility] and to get up to chair for time OOB.  Her plan is to follow acutely and will have HHPT follow up at home in ALF.  Will need mobility assistance at first so will need to ck with SW/case management to ensure her help with be available at ALF.  Focus acute therapy on standing balance and LE strength.    Follow Up Recommendations Home health PT;Supervision for mobility/OOB    Equipment Recommendations  None recommended by PT    Recommendations for Other Services       Precautions / Restrictions Precautions Precautions: Fall (telemetry) Precaution Comments: incontinent Restrictions Weight Bearing Restrictions: No      Mobility  Bed Mobility Overal bed mobility: Needs Assistance Bed Mobility: Supine to Sit     Supine to sit: Mod assist     General bed mobility comments: has extra linen under her that is wet and hinders her effort to scoot out to EOB  Transfers Overall transfer level: Needs assistance Equipment used: 1 person hand held assist;Rolling walker (2 wheeled)             General transfer comment: used walker with cued hand placement  Ambulation/Gait Ambulation/Gait assistance: Min assist Ambulation Distance (Feet): 5 Feet Assistive device: Rolling walker (2 wheeled);1 person hand held assist Gait Pattern/deviations: Step-through pattern;Step-to pattern;Wide base of support;Trunk flexed;Shuffle;Decreased stride length Gait velocity: reduced Gait velocity interpretation: Below normal speed for age/gender General Gait Details: struggles to sequence her feet when transitioning to chair at  bedside  Stairs            Wheelchair Mobility    Modified Rankin (Stroke Patients Only)       Balance Overall balance assessment: Needs assistance;History of Falls Sitting-balance support: Feet supported Sitting balance-Leahy Scale: Fair   Postural control: Posterior lean Standing balance support: Bilateral upper extremity supported Standing balance-Leahy Scale: Poor                               Pertinent Vitals/Pain Pain Assessment: Faces Faces Pain Scale: Hurts whole lot Pain Location: L side with initial mobility Pain Descriptors / Indicators: Aching;Cramping Pain Intervention(s): Limited activity within patient's tolerance;Monitored during session;Premedicated before session;Repositioned    Home Living Family/patient expects to be discharged to:: Assisted living               Home Equipment: Walker - 2 wheels      Prior Function Level of Independence: Independent with assistive device(s)               Hand Dominance        Extremity/Trunk Assessment   Upper Extremity Assessment: Overall WFL for tasks assessed           Lower Extremity Assessment: Generalized weakness      Cervical / Trunk Assessment: Kyphotic  Communication   Communication: No difficulties  Cognition Arousal/Alertness: Awake/alert Behavior During Therapy: WFL for tasks assessed/performed Overall Cognitive Status: Within Functional Limits for tasks assessed  General Comments General comments (skin integrity, edema, etc.): [facility] and able to balance to use it     Exercises     Assessment/Plan    PT Assessment Patient needs continued PT services  PT Problem List Decreased strength;Decreased range of motion;Decreased activity tolerance;Decreased coordination;Decreased mobility;Decreased balance;Decreased knowledge of use of DME;Decreased safety awareness;Cardiopulmonary status limiting  activity;Obesity;Decreased skin integrity;Pain          PT Treatment Interventions DME instruction;Gait training;Functional mobility training;Therapeutic activities;Therapeutic exercise;Balance training;Neuromuscular re-education;Patient/family education    PT Goals (Current goals can be found in the Care Plan section)  Acute Rehab PT Goals Patient Stated Goal: to feel better and walk more PT Goal Formulation: With patient Time For Goal Achievement: 11/02/16 Potential to Achieve Goals: Good    Frequency Min 2X/week   Barriers to discharge Decreased caregiver support may not have staff to assist gait    Co-evaluation               End of Session Equipment Utilized During Treatment: Gait belt Activity Tolerance: Patient tolerated treatment well;Patient limited by fatigue;Patient limited by pain Patient left: in chair;with call bell/phone within reach;with family/visitor present Nurse Communication: Mobility status    Functional Assessment Tool Used: clinical jugment Functional Limitation: Mobility: Walking and moving around Mobility: Walking and Moving Around Current Status ([id number]): At least 40 percent but less than 60 percent impaired, limited or restricted Mobility: Walking and Moving Around Goal Status [phone]([id number]): At least 1 percent but less than 20 percent impaired, limited or restricted    Time: [id number] PT Time Calculation (min) (ACUTE ONLY): 29 min   Charges:   PT Evaluation $PT Eval Moderate Complexity: 1 Procedure PT Treatments $Gait Training: 8-22 mins   PT G Codes:   PT G-Codes **NOT FOR INPATIENT CLASS** Functional Assessment Tool Used: clinical jugment Functional Limitation: Mobility: Walking and moving around Mobility: Walking and Moving Around Current Status ([id number]([id number]): At least 40 percent but less than 60 percent impaired, limited or restricted Mobility: Walking and Moving Around Goal Status [phone](G8979): At least 1 percent but less than 20 percent impaired,  limited or restricted    Alexandra English, Alexandra English 10/19/2016, 5:01 PM    Alexandra English, PT MS Acute Rehab Dept. Number: [facility] [phone] and [facility] [phone]

## 2016-10-19 NOTE — Progress Notes (Signed)
*  PRELIMINARY RESULTS* Echocardiogram 2D Echocardiogram has been performed.  Takashi Korol S Joe Tanney 10/19/2016, 8:16 AM

## 2016-10-19 NOTE — Progress Notes (Signed)
[facility] - [place] [facility]   PATIENT NAME: Alexandra English    MR#:  [id number]  DATE OF BIRTH:  13-May-1927  SUBJECTIVE:  CHIEF COMPLAINT:   Chief Complaint  Patient presents with  . Fall   - Patient came in after a fall from weakness, hit the left side of her body. Complaining of pain. X-rays with no fractures. -Chronic lower extremity edema present.  REVIEW OF SYSTEMS:  Review of Systems  Constitutional: Positive for malaise/fatigue. Negative for chills and fever.  HENT: Positive for hearing loss.   Eyes: Negative for blurred vision and double vision.  Respiratory: Negative for cough, shortness of breath and wheezing.   Cardiovascular: Positive for leg swelling. Negative for chest pain and palpitations.  Gastrointestinal: Negative for abdominal pain, constipation, diarrhea, nausea and vomiting.  Genitourinary: Positive for frequency. Negative for dysuria and urgency.  Musculoskeletal: Positive for back pain, falls and myalgias.  Neurological: Negative for dizziness, sensory change, speech change, focal weakness, seizures and headaches.  Psychiatric/Behavioral: Negative for depression.    DRUG ALLERGIES:   Allergies  Allergen Reactions  . Beta Adrenergic Blockers   . Cephalosporins   . Lisinopril   . Macrobid [Nitrofurantoin Macrocrystal]   . Penicillins   . Toprol Xl [Metoprolol Succinate]     VITALS:  Blood pressure (!) 169/61, pulse 100, temperature 98.3 F (36.8 C), temperature source Oral, resp. rate 16, height 5\' 5"  (1.651 m), weight 83.8 kg (184 lb 12.8 oz), SpO2 95 %.  PHYSICAL EXAMINATION:  Physical Exam  GENERAL:  82 y.o.-year-old patient lying in the bed with no acute distress.  EYES: Pupils equal, round, reactive to light and accommodation. No scleral icterus. Extraocular muscles intact.  HEENT: Head atraumatic, normocephalic. Oropharynx and nasopharynx clear.  NECK:  Supple, no jugular venous distention. No thyroid  enlargement, no tenderness.  LUNGS: Normal breath sounds bilaterally, no wheezing, rales,rhonchi or crepitation. No use of accessory muscles of respiration.  CARDIOVASCULAR: S1, S2 normal. No rubs, or gallops. 2/6 systolic murmur is present ABDOMEN: Soft, nontender, nondistended. Bowel sounds present. No organomegaly or mass.  EXTREMITIES: No pedal edema, cyanosis, or clubbing.  Musculoskeletal: Pain in the left flank and lower back regions. No bruising noted NEUROLOGIC: Cranial nerves II through XII are intact. Muscle strength 5/5 in all extremities. Sensation intact. Gait not checked.  PSYCHIATRIC: The patient is alert and oriented x 3.  SKIN: No obvious rash, lesion, or ulcer.    LABORATORY PANEL:   CBC  Recent Labs Lab 10/18/16 2339  WBC 19.1*  HGB 13.2  HCT 38.3  PLT 247   ------------------------------------------------------------------------------------------------------------------  Chemistries   Recent Labs Lab 10/18/16 2339  NA 133*  K 3.9  CL 99*  CO2 25  GLUCOSE 220*  BUN 15  CREATININE 0.92  CALCIUM 9.1   ------------------------------------------------------------------------------------------------------------------  Cardiac Enzymes  Recent Labs Lab 10/18/16 2339  TROPONINI <0.03   ------------------------------------------------------------------------------------------------------------------  RADIOLOGY:  Dg Lumbar Spine Complete  Result Date: 10/19/2016 CLINICAL DATA:  Acute onset of left lower back pain after falling. Initial encounter. EXAM: LUMBAR SPINE - COMPLETE 4+ VIEW COMPARISON:  CT of the abdomen and pelvis performed 07/03/2015, and lumbar spine radiographs performed 12/04/2011 FINDINGS: There is no evidence of fracture or subluxation. Vertebral bodies demonstrate normal height and alignment. Intervertebral disc spaces are preserved. Mild facet disease is noted along the lower lumbar spine. The visualized bowel gas pattern is  unremarkable in appearance; air and stool are noted within the colon. The sacroiliac joints are  within normal limits. Clips are noted within the right upper quadrant, reflecting prior cholecystectomy. IMPRESSION: No evidence of fracture or subluxation along the lumbar spine. Electronically Signed   By: Roanna Raider M.D.   On: 10/19/2016 00:48   Ct Head Wo Contrast  Result Date: 10/18/2016 CLINICAL DATA:  Trip and fall versus syncopal episode. Hyperglycemic. Headache, posterior head pain. History of hypertension, diabetes and strokes. EXAM: CT HEAD WITHOUT CONTRAST TECHNIQUE: Contiguous axial images were obtained from the base of the skull through the vertex without intravenous contrast. COMPARISON:  CT HEAD Apr 18, 2014 FINDINGS: BRAIN: Unchanged moderate to severe ventriculomegaly with disproportionate sulcal effacement at the convexities. Old bilateral basal ganglia infarcts. LEFT parietal encephalomalacia. No intraparenchymal hemorrhage, mass effect, midline shift or acute large vascular territory infarcts. Confluent supratentorial white matter hypodensities compatible with chronic small vessel ischemic disease. No abnormal extra-axial fluid collections. Basal cisterns are patent. VASCULAR: Moderate calcific atherosclerosis of the carotid siphons. SKULL: No skull fracture. No significant scalp soft tissue swelling. SINUSES/ORBITS: The mastoid air-cells and included paranasal sinuses are well-aerated.The included ocular globes and orbital contents are non-suspicious. OTHER: None. IMPRESSION: No acute intracranial process. Stable examination including evidence of normal pressure hydrocephalus, old bilateral basal ganglia and LEFT parietal lobe infarcts. Electronically Signed   By: Awilda Metro M.D.   On: 10/18/2016 22:51   Dg Chest Port 1 View  Result Date: 10/19/2016 CLINICAL DATA:  Acute onset of left-sided chest pain after losing balance and falling. Initial encounter. EXAM: PORTABLE CHEST 1 VIEW  COMPARISON:  Chest radiograph performed 05/03/2012 FINDINGS: The lungs are well-aerated. Peribronchial thickening is noted. Mildly increased see interstitial markings may be transient in nature. There is no evidence of focal opacification, pleural effusion or pneumothorax. The cardiomediastinal silhouette is borderline normal in size. No acute osseous abnormalities are seen. IMPRESSION: 1. Peribronchial thickening noted. Mildly increased interstitial markings may be transient in nature. 2. No displaced rib fracture seen. Electronically Signed   By: Roanna Raider M.D.   On: 10/19/2016 00:51   Dg Hip Unilat W Or Wo Pelvis 2-3 Views Left  Result Date: 10/19/2016 CLINICAL DATA:  Acute onset of left hip pain after fall. Initial encounter. EXAM: DG HIP (WITH OR WITHOUT PELVIS) 2-3V LEFT COMPARISON:  None. FINDINGS: There is no evidence of fracture or dislocation. Both femoral heads are seated normally within their respective acetabula. The proximal left femur appears intact. No significant degenerative change is appreciated. The sacroiliac joints are unremarkable in appearance. The visualized bowel gas pattern is grossly unremarkable in appearance. IMPRESSION: No evidence of fracture or dislocation. Electronically Signed   By: Roanna Raider M.D.   On: 10/19/2016 00:49    EKG:   Orders placed or performed during the hospital encounter of 10/18/16  . EKG 12-Lead  . EKG 12-Lead  . ED EKG  . ED EKG    ASSESSMENT AND PLAN:   82 year old female with past medical history significant for diabetes mellitus, hypertension, sleep apnea, history of CVA, hypothyroidism old lives independently at home presents to hospital after weakness and fall.  #1 acute cystitis-urine cultures are pending. Complaints of increased frequency of urination. -Started on Rocephin as white count is pretty high.  #2 fall and left flank pain-lumbar x-ray and rib x-rays are negative. X-ray of the pelvis is negative as well. -Added  muscle relaxants. Physical therapy consult at. Patient has a walker at baseline.  #3 chronic lower extremity edema-with her possible near syncopal episode, monitor on telemetry. Echocardiogram and carotid Dopplers  ordered. -Lasix as needed as outpatient  #4 diabetes mellitus-sliding scale insulin. Hold glipizide for now. On Januvia-changed to Tradjenta in the hospital  #5 hypertension-continue home medications. Patient on Norvasc and clonidine  #6 DVT prophylaxis-on Lovenox  Physical therapy consulted   All the records are reviewed and case discussed with Care Management/Social Workerr. Management plans discussed with the patient, family and they are in agreement.  CODE STATUS: Full code  TOTAL TIME TAKING CARE OF THIS PATIENT: 38 minutes.   POSSIBLE D/C tomorrow, DEPENDING ON CLINICAL CONDITION.   Enid BaasKALISETTI,Jaselle Pryer M.D on 10/19/2016 at 9:59 AM  Between 7am to 6pm - Pager - [phone]  After 6pm go to [email or web address] - Social research officer, governmentpassword EPAS [facility]  [facility]  Office  [phone]  CC: Primary care physician; Rennie PlowmanMargaret Arnett, FNP

## 2016-10-19 NOTE — H&P (Signed)
Alexandra English is an 82 y.o. female.   Chief Complaint: Syncope HPI: The patient with past medical history of stroke presents to the emergency department after a fall. The patient states that she was preparing a snack in the kitchen when she became weak and fell over. She did not hit her head but did injure her side she fell. She denies blacking out but cannot recall if she became lightheaded or if her legs gave way. She admits to some nausea at the time of her collapse but denies chest pain or palpitations. Past medical history is significant for imbalance but she usually does not have problems when using her walker or supporting herself in this case by the counter. Laboratory evaluation in the emergency department revealed leukocytosis as well as urinalysis concerning for infection. She was given a dose of ceftriaxone prior to the emergency department staff called the hospitalist service for further evaluation.  Past Medical History:  Diagnosis Date  . Diabetes mellitus 04/2009   HgbA1c 7.1% 07/2009, Monofilament normal, Ortho pending  . Fracture    of rib  . Glaucoma    Dr. Thomasene Ripple  . Hypertension   . Sleep apnea    Per Pt. was supposed to start CPAP, however reluctant to try  . Stroke [facility])    Left frontal4/2010, Left parietal and right parietal 04/2011 , Started on Plavix and then changed to Coumadin by recommendation of Dr. Carlis Abbott  . Thyroid disease    hypothyroidism    Past Surgical History:  Procedure Laterality Date  . orthopedic surgeon     Dr. Latanya Maudlin  . TOTAL ABDOMINAL HYSTERECTOMY    . TOTAL SHOULDER REPLACEMENT Right   . WRIST SURGERY Left     Family History  Problem Relation Age of Onset  . CAD Mother   . CAD Father   . Diabetes Mellitus I Brother   . Diabetes Mellitus II Maternal Grandmother    Social History:  reports that she has never smoked. She has never used smokeless tobacco. She reports that she does not drink alcohol or use drugs.  Allergies:  Allergies   Allergen Reactions  . Beta Adrenergic Blockers   . Cephalosporins   . Lisinopril   . Macrobid [Nitrofurantoin Macrocrystal]   . Penicillins   . Toprol Xl [Metoprolol Succinate]     Medications Prior to Admission  Medication Sig Dispense Refill  . amLODipine (NORVASC) 5 MG tablet TAKE ONE TABLET BY MOUTH ONCE DAILY 90 tablet 3  . aspirin 81 MG tablet Take 81 mg by mouth daily.      Marland Kitchen atorvastatin (LIPITOR) 40 MG tablet TAKE ONE TABLET BY MOUTH ONCE DAILY 90 tablet 3  . cloNIDine (CATAPRES) 0.2 MG tablet TAKE ONE TABLET BY MOUTH AT BEDTIME 90 tablet 3  . glipiZIDE (GLUCOTROL) 10 MG tablet TAKE ONE TABLET BY MOUTH TWICE DAILY BEFORE MEAL(S) 180 tablet 1  . hydrOXYzine (ATARAX/VISTARIL) 25 MG tablet Take 1 tablet (25 mg total) by mouth daily. 90 tablet 3  . levothyroxine (SYNTHROID, LEVOTHROID) 75 MCG tablet TAKE ONE TABLET BY MOUTH ONCE DAILY 90 tablet 3  . mirtazapine (REMERON) 30 MG tablet Take 1 tablet (30 mg total) by mouth at bedtime. 30 tablet 3  . omeprazole (PRILOSEC) 20 MG capsule TAKE ONE CAPSULE BY MOUTH ONCE DAILY 90 capsule 3  . sitaGLIPtin (JANUVIA) 25 MG tablet Take 1 tablet (25 mg total) by mouth daily. 30 tablet 5  . traMADol (ULTRAM) 50 MG tablet TAKE ONE TABLET BY MOUTH  THREE TIMES DAILY AS NEEDED 90 tablet 0  . Blood Glucose Monitoring Suppl (ONE TOUCH ULTRA SYSTEM KIT) W/DEVICE KIT 1 kit by Does not apply route once. 1 each 0  . glucose blood (ONE TOUCH ULTRA TEST) test strip USE ONE STRIP TO CHECK GLUCOSE TWICE DAILY E11.8 100 each 2  . Incontinence Supply Disposable (INCONTINENCE BRIEF LARGE) MISC Use as directed. 54 each 11  . Lancets (ONETOUCH ULTRASOFT) lancets Use as instructed 100 each 3    Results for orders placed or performed during the hospital encounter of 10/18/16 (from the past 48 hour(s))  Basic metabolic panel     Status: Abnormal   Collection Time: 10/18/16 11:39 PM  Result Value Ref Range   Sodium 133 (L) 135 - 145 mmol/L   Potassium 3.9 3.5 -  5.1 mmol/L   Chloride 99 (L) 101 - 111 mmol/L   CO2 25 22 - 32 mmol/L   Glucose, Bld 220 (H) 65 - 99 mg/dL   BUN 15 6 - 20 mg/dL   Creatinine, Ser 0.92 0.44 - 1.00 mg/dL   Calcium 9.1 8.9 - 10.3 mg/dL   GFR calc non Af Amer 55 (L) >60 mL/min   GFR calc Af Amer >60 >60 mL/min    Comment: (NOTE) The eGFR has been calculated using the CKD EPI equation. This calculation has not been validated in all clinical situations. eGFR's persistently <60 mL/min signify possible Chronic Kidney Disease.    Anion gap 9 5 - 15  CBC     Status: Abnormal   Collection Time: 10/18/16 11:39 PM  Result Value Ref Range   WBC 19.1 (H) 3.6 - 11.0 K/uL   RBC 4.28 3.80 - 5.20 MIL/uL   Hemoglobin 13.2 12.0 - 16.0 g/dL   HCT 38.3 35.0 - 47.0 %   MCV 89.5 80.0 - 100.0 fL   MCH 30.7 26.0 - 34.0 pg   MCHC 34.3 32.0 - 36.0 g/dL   RDW 13.0 11.5 - 14.5 %   Platelets 247 150 - 440 K/uL  Troponin I     Status: None   Collection Time: 10/18/16 11:39 PM  Result Value Ref Range   Troponin I <0.03 <0.03 ng/mL  Urinalysis complete, with microscopic ([facility] only)     Status: Abnormal   Collection Time: 10/19/16  2:17 AM  Result Value Ref Range   Color, Urine YELLOW (A) YELLOW   APPearance HAZY (A) CLEAR   Glucose, UA 50 (A) NEGATIVE mg/dL   Bilirubin Urine NEGATIVE NEGATIVE   Ketones, ur NEGATIVE NEGATIVE mg/dL   Specific Gravity, Urine 1.004 (L) 1.005 - 1.030   Hgb urine dipstick NEGATIVE NEGATIVE   pH 7.0 5.0 - 8.0   Protein, ur NEGATIVE NEGATIVE mg/dL   Nitrite NEGATIVE NEGATIVE   Leukocytes, UA 2+ (A) NEGATIVE   RBC / HPF 0-5 0 - 5 RBC/hpf   WBC, UA 6-30 0 - 5 WBC/hpf   Bacteria, UA FEW (A) NONE SEEN   Squamous Epithelial / LPF 0-5 (A) NONE SEEN   Dg Lumbar Spine Complete  Result Date: 10/19/2016 CLINICAL DATA:  Acute onset of left lower back pain after falling. Initial encounter. EXAM: LUMBAR SPINE - COMPLETE 4+ VIEW COMPARISON:  CT of the abdomen and pelvis performed 07/03/2015, and lumbar spine  radiographs performed 12/04/2011 FINDINGS: There is no evidence of fracture or subluxation. Vertebral bodies demonstrate normal height and alignment. Intervertebral disc spaces are preserved. Mild facet disease is noted along the lower lumbar spine. The visualized bowel gas  pattern is unremarkable in appearance; air and stool are noted within the colon. The sacroiliac joints are within normal limits. Clips are noted within the right upper quadrant, reflecting prior cholecystectomy. IMPRESSION: No evidence of fracture or subluxation along the lumbar spine. Electronically Signed   By: Garald Balding M.D.   On: 10/19/2016 00:48   Ct Head Wo Contrast  Result Date: 10/18/2016 CLINICAL DATA:  Trip and fall versus syncopal episode. Hyperglycemic. Headache, posterior head pain. History of hypertension, diabetes and strokes. EXAM: CT HEAD WITHOUT CONTRAST TECHNIQUE: Contiguous axial images were obtained from the base of the skull through the vertex without intravenous contrast. COMPARISON:  CT HEAD Apr 18, 2014 FINDINGS: BRAIN: Unchanged moderate to severe ventriculomegaly with disproportionate sulcal effacement at the convexities. Old bilateral basal ganglia infarcts. LEFT parietal encephalomalacia. No intraparenchymal hemorrhage, mass effect, midline shift or acute large vascular territory infarcts. Confluent supratentorial white matter hypodensities compatible with chronic small vessel ischemic disease. No abnormal extra-axial fluid collections. Basal cisterns are patent. VASCULAR: Moderate calcific atherosclerosis of the carotid siphons. SKULL: No skull fracture. No significant scalp soft tissue swelling. SINUSES/ORBITS: The mastoid air-cells and included paranasal sinuses are well-aerated.The included ocular globes and orbital contents are non-suspicious. OTHER: None. IMPRESSION: No acute intracranial process. Stable examination including evidence of normal pressure hydrocephalus, old bilateral basal ganglia and LEFT  parietal lobe infarcts. Electronically Signed   By: Elon Alas M.D.   On: 10/18/2016 22:51   Dg Chest Port 1 View  Result Date: 10/19/2016 CLINICAL DATA:  Acute onset of left-sided chest pain after losing balance and falling. Initial encounter. EXAM: PORTABLE CHEST 1 VIEW COMPARISON:  Chest radiograph performed 05/03/2012 FINDINGS: The lungs are well-aerated. Peribronchial thickening is noted. Mildly increased see interstitial markings may be transient in nature. There is no evidence of focal opacification, pleural effusion or pneumothorax. The cardiomediastinal silhouette is borderline normal in size. No acute osseous abnormalities are seen. IMPRESSION: 1. Peribronchial thickening noted. Mildly increased interstitial markings may be transient in nature. 2. No displaced rib fracture seen. Electronically Signed   By: Garald Balding M.D.   On: 10/19/2016 00:51   Dg Hip Unilat W Or Wo Pelvis 2-3 Views Left  Result Date: 10/19/2016 CLINICAL DATA:  Acute onset of left hip pain after fall. Initial encounter. EXAM: DG HIP (WITH OR WITHOUT PELVIS) 2-3V LEFT COMPARISON:  None. FINDINGS: There is no evidence of fracture or dislocation. Both femoral heads are seated normally within their respective acetabula. The proximal left femur appears intact. No significant degenerative change is appreciated. The sacroiliac joints are unremarkable in appearance. The visualized bowel gas pattern is grossly unremarkable in appearance. IMPRESSION: No evidence of fracture or dislocation. Electronically Signed   By: Garald Balding M.D.   On: 10/19/2016 00:49    Review of Systems  Constitutional: Negative for chills and fever.  HENT: Negative for sore throat and tinnitus.   Eyes: Negative for blurred vision and redness.  Respiratory: Negative for cough and shortness of breath.   Cardiovascular: Negative for chest pain, palpitations, orthopnea and PND.  Gastrointestinal: Negative for abdominal pain, diarrhea, nausea and  vomiting.  Genitourinary: Negative for dysuria, frequency and urgency.  Musculoskeletal: Negative for joint pain and myalgias.  Skin: Negative for rash.       No lesions  Neurological: Negative for speech change, focal weakness and weakness.  Endo/Heme/Allergies: Does not bruise/bleed easily.       No temperature intolerance  Psychiatric/Behavioral: Negative for depression and suicidal ideas.    Blood  pressure (!) 152/75, pulse 74, temperature 97.8 F (36.6 C), temperature source Oral, resp. rate 18, height 5' 5"  (1.651 m), weight 86.2 kg (190 lb), SpO2 94 %. Physical Exam  Vitals reviewed. Constitutional: She is oriented to person, place, and time. She appears well-developed and well-nourished. No distress.  HENT:  Head: Normocephalic and atraumatic.  Mouth/Throat: Oropharynx is clear and moist.  Eyes: Conjunctivae and EOM are normal. Pupils are equal, round, and reactive to light. No scleral icterus.  Neck: Normal range of motion. Neck supple. No JVD present. No tracheal deviation present. No thyromegaly present.  Cardiovascular: Normal rate, regular rhythm and normal heart sounds.  Exam reveals no gallop and no friction rub.   No murmur heard. Respiratory: Effort normal and breath sounds normal.  GI: Soft. Bowel sounds are normal. She exhibits no distension. There is no tenderness.  Genitourinary:  Genitourinary Comments: Deferred  Musculoskeletal: She exhibits edema.  Lymphadenopathy:    She has no cervical adenopathy.  Neurological: She is alert and oriented to person, place, and time. No cranial nerve deficit. She exhibits normal muscle tone.  Skin: Skin is warm and dry. No rash noted. No erythema.  Psychiatric: She has a normal mood and affect. Her behavior is normal. Judgment and thought content normal.     Assessment/Plan This is a 82 year old female admitted for syncope. 1. Syncope: Possibly near-syncope; the patient had recently seen a cardiologist who reported that  she may be concerned about valvular issues and had scheduled an echocardiogram for next week. We will expedite imaging during this hospitalization. No arrhythmia on EKG. Differential diagnosis includes dehydration. Check orthostatic vital signs. Place the patient on intravenous fluid. 2. Hypertension: Borderline for her age; I have continued the patient's antihypertensive medications as she has plenty of room for control. If she is found to be sensitive to some are all of her medications her outpatient regimen may need revision. 3. UTI: IV antibiotics given in the emergency department. The patient is not septic. Transition to oral Bactrim. 4. Diabetes mellitus type 2: Hold oral hypoglycemics. May continue linagliptin. Sliding scale insulin while hospitalized. 5. Cerebrovascular disease: No new neurologic deficits. (The patient has some residual weakness of her right side as well as ongoing neuropathy/paresthesia). Continue aspirin. 6. Hyperlipidemia: Continue statin therapy 7. Hypothyroidism: Continue Synthroid. Check TSH. 8. DVT prophylaxis: Lovenox 9. GI prophylaxis: Pantoprazole per home regimen The patient is a full code. Time spent on admission orders and patient care approximately 45 minutes  Harrie Foreman, MD 10/19/2016, 6:33 AM

## 2016-10-19 NOTE — Care Management Obs Status (Signed)
MEDICARE OBSERVATION STATUS NOTIFICATION   Patient Details  Name: Alexandra KempBetty L Wenker MRN: [id number] Date of Birth: 09-29-1927   Medicare Observation Status Notification Given:  Yes (syncope)    Caren MacadamMichelle Wm Sahagun, RN 10/19/2016, 6:12 AM

## 2016-10-20 DIAGNOSIS — R55 Syncope and collapse: Secondary | ICD-10-CM | POA: Diagnosis not present

## 2016-10-20 DIAGNOSIS — E039 Hypothyroidism, unspecified: Secondary | ICD-10-CM | POA: Diagnosis not present

## 2016-10-20 DIAGNOSIS — E119 Type 2 diabetes mellitus without complications: Secondary | ICD-10-CM | POA: Diagnosis not present

## 2016-10-20 DIAGNOSIS — I10 Essential (primary) hypertension: Secondary | ICD-10-CM | POA: Diagnosis not present

## 2016-10-20 LAB — CBC
HEMATOCRIT: 36.9 % (ref 35.0–47.0)
HEMOGLOBIN: 13 g/dL (ref 12.0–16.0)
MCH: 31.4 pg (ref 26.0–34.0)
MCHC: 35.1 g/dL (ref 32.0–36.0)
MCV: 89.4 fL (ref 80.0–100.0)
Platelets: 244 10*3/uL (ref 150–440)
RBC: 4.12 MIL/uL (ref 3.80–5.20)
RDW: 13.3 % (ref 11.5–14.5)
WBC: 10.2 10*3/uL (ref 3.6–11.0)

## 2016-10-20 LAB — BASIC METABOLIC PANEL
Anion gap: 8 (ref 5–15)
BUN: 12 mg/dL (ref 6–20)
CALCIUM: 8.6 mg/dL — AB (ref 8.9–10.3)
CHLORIDE: 106 mmol/L (ref 101–111)
CO2: 22 mmol/L (ref 22–32)
CREATININE: 0.75 mg/dL (ref 0.44–1.00)
GFR calc non Af Amer: >60 mL/min (ref >60)
GLUCOSE: 163 mg/dL — AB (ref 65–99)
Potassium: 3.5 mmol/L (ref 3.5–5.1)
Sodium: 136 mmol/L (ref 135–145)

## 2016-10-20 LAB — GLUCOSE, CAPILLARY
Glucose-Capillary: 144 mg/dL — ABNORMAL HIGH (ref 65–99)
Glucose-Capillary: 191 mg/dL — ABNORMAL HIGH (ref 65–99)

## 2016-10-20 MED ORDER — METAXALONE 800 MG PO TABS: 800.0000 mg | ORAL_TABLET | Freq: Three times a day (TID) | ORAL | 0 refills | Status: DC

## 2016-10-20 MED ORDER — LEVOFLOXACIN 250 MG PO TABS: 250.0000 mg | ORAL_TABLET | Freq: Every day | ORAL | 0 refills | Status: DC

## 2016-10-20 MED ORDER — GLIPIZIDE 5 MG PO TABS: 5.0000 mg | ORAL_TABLET | Freq: Two times a day (BID) | ORAL | 2 refills | Status: DC

## 2016-10-20 NOTE — Care Management Note (Addendum)
Case Management Note  Patient Details  Name: Alexandra English MRN: [id number] Date of Birth: [birth year]  Subjective/Objective:      [facility] for [facility] health RN and PT [name] She chose [facility] because they are recommended by the Orthopedic specialists. [name]'s son will transport her home today per her report.               Action/Plan:   Expected Discharge Date:  10/20/16               Expected Discharge Plan:     In-House Referral:     Discharge planning Services     Post Acute Care Choice:    Choice offered to:     DME Arranged:    DME Agency:     HH Arranged:    HH Agency:     Status of Service:     If discussed at [employer] Meetings, dates discussed:    Additional Comments:  Dhruva Orndoff A, RN 10/20/2016, 9:26 AM

## 2016-10-20 NOTE — Progress Notes (Signed)
Pt. Discharged to home w hh via wc. Discharge instructions and medication regimen reviewed at bedside with patient and son. Pt. verbalizes understanding of instructions and medication regimen. Prescriptions sent to pharmacy. Patient assessment unchanged from this morning. TELE and IV discontinued per policy.

## 2016-10-20 NOTE — Discharge Summary (Signed)
[facility] [place] NAME: Alexandra English    MR#:  [id number]  DATE OF BIRTH:  Apr 18, 1927  DATE OF ADMISSION:  10/18/2016   ADMITTING PHYSICIAN: Harrie Foreman, MD  DATE OF DISCHARGE: 10/20/2016  PRIMARY CARE PHYSICIAN: [name], FNP   ADMISSION DIAGNOSIS:   Peripheral edema [R60.9] Acute cystitis without hematuria [N30.00] Syncope, unspecified syncope type [R55]  DISCHARGE DIAGNOSIS:   Active Problems:   Syncope   SECONDARY DIAGNOSIS:   Past Medical History:  Diagnosis Date  . Diabetes mellitus 04/2009   HgbA1c 7.1% 07/2009, Monofilament normal, Ortho pending  . Fracture    of rib  . Glaucoma    Dr. Thomasene Ripple  . Hypertension   . Sleep apnea    Per Pt. was supposed to start CPAP, however reluctant to try  . Stroke [facility])    Left frontal4/2010, Left parietal and right parietal 04/2011 , Started on Plavix and then changed to Coumadin by recommendation of Dr. Carlis Abbott  . Thyroid disease    hypothyroidism    HOSPITAL COURSE:   82 year old female with past medical history significant for diabetes mellitus, hypertension, sleep apnea, history of CVA, hypothyroidism old lives independently at home presents to hospital after weakness and fall.  #1 acute cystitis-urine cultures are pending. Improved symptoms -WBC normalized. Received Rocephin in the hospital and is being discharged on Levaquin.  #2 fall and left flank pain-lumbar x-ray and rib x-rays are negative. X-ray of the pelvis is negative as well. -Added muscle relaxants. Physical therapy consulted. Patient has a walker at baseline. They have evaluated the patient and recommended home health. Her pain has significantly improved  #3 chronic lower extremity edema-with her possible near syncopal episode, no arrhythmias on telemetry. Echocardiogram with mild systolic dysfunction, EF 77% and carotid Dopplers with no hemodynamically significant stenosis. -Lasix as needed  as outpatient  #4 diabetes mellitus- since sugars were well-controlled, decreased glipizide to half dose, continue Januvia. Follow-up with PCP.  #5 hypertension-continue home medications. Patient on Norvasc and clonidine  Physical therapy recommended home health. Patient is being discharged today.  DISCHARGE CONDITIONS:   Guarded CONSULTS OBTAINED:    none  DRUG ALLERGIES:   Allergies  Allergen Reactions  . Beta Adrenergic Blockers   . Cephalosporins   . Lisinopril   . Macrobid [Nitrofurantoin Macrocrystal]   . Penicillins   . Toprol Xl [Metoprolol Succinate]    DISCHARGE MEDICATIONS:     Medication List    TAKE these medications   amLODipine 5 MG tablet Commonly known as:  NORVASC TAKE ONE TABLET BY MOUTH ONCE DAILY   aspirin 81 MG tablet Take 81 mg by mouth daily.   atorvastatin 40 MG tablet Commonly known as:  LIPITOR TAKE ONE TABLET BY MOUTH ONCE DAILY   cloNIDine 0.2 MG tablet Commonly known as:  CATAPRES TAKE ONE TABLET BY MOUTH AT BEDTIME   glipiZIDE 5 MG tablet Commonly known as:  GLUCOTROL Take 1 tablet (5 mg total) by mouth 2 (two) times daily before a meal. What changed:  See the new instructions.   glucose blood test strip Commonly known as:  ONE TOUCH ULTRA TEST USE ONE STRIP TO CHECK GLUCOSE TWICE DAILY E11.8   hydrOXYzine 25 MG tablet Commonly known as:  ATARAX/VISTARIL Take 1 tablet (25 mg total) by mouth daily.   Incontinence Brief Large Misc Use as directed.   levofloxacin 250 MG tablet Commonly known as:  LEVAQUIN Take 1 tablet (250 mg total) by  mouth daily. X 3 more days   levothyroxine 75 MCG tablet Commonly known as:  SYNTHROID, LEVOTHROID TAKE ONE TABLET BY MOUTH ONCE DAILY   metaxalone 800 MG tablet Commonly known as:  SKELAXIN Take 1 tablet (800 mg total) by mouth 3 (three) times daily. X 3 days and stop   mirtazapine 30 MG tablet Commonly known as:  REMERON Take 1 tablet (30 mg total) by mouth at bedtime.     omeprazole 20 MG capsule Commonly known as:  PRILOSEC TAKE ONE CAPSULE BY MOUTH ONCE DAILY   ONE TOUCH ULTRA SYSTEM KIT w/Device Kit 1 kit by Does not apply route once.   onetouch ultrasoft lancets Use as instructed   sitaGLIPtin 25 MG tablet Commonly known as:  JANUVIA Take 1 tablet (25 mg total) by mouth daily.   traMADol 50 MG tablet Commonly known as:  ULTRAM TAKE ONE TABLET BY MOUTH THREE TIMES DAILY AS NEEDED        DISCHARGE INSTRUCTIONS:   1. PCP follow-up in 1 week  DIET:   Cardiac diet  ACTIVITY:   Activity as tolerated  OXYGEN:   Home Oxygen: No.  Oxygen Delivery: room air  DISCHARGE LOCATION:   home   If you experience worsening of your admission symptoms, develop shortness of breath, life threatening emergency, suicidal or homicidal thoughts you must seek medical attention immediately by calling 911 or calling your MD immediately  if symptoms less severe.  You Must read complete instructions/literature along with all the possible adverse reactions/side effects for all the Medicines you take and that have been prescribed to you. Take any new Medicines after you have completely understood and accpet all the possible adverse reactions/side effects.   Please note  You were cared for by a hospitalist during your hospital stay. If you have any questions about your discharge medications or the care you received while you were in the hospital after you are discharged, you can call the unit and asked to speak with the hospitalist on call if the hospitalist that took care of you is not available. Once you are discharged, your primary care physician will handle any further medical issues. Please note that NO REFILLS for any discharge medications will be authorized once you are discharged, as it is imperative that you return to your primary care physician (or establish a relationship with a primary care physician if you do not have one) for your aftercare needs so  that they can reassess your need for medications and monitor your lab values.    On the day of Discharge:  VITAL SIGNS:   Blood pressure (!) 161/67, pulse 68, temperature 97.7 F (36.5 C), temperature source Oral, resp. rate 18, height 5' 5"  (1.651 m), weight 85 kg (187 lb 4.8 oz), SpO2 94 %.  PHYSICAL EXAMINATION:    GENERAL:  81 y.o.-year-old patient lying in the bed with no acute distress.  EYES: Pupils equal, round, reactive to light and accommodation. No scleral icterus. Extraocular muscles intact.  HEENT: Head atraumatic, normocephalic. Oropharynx and nasopharynx clear.  NECK:  Supple, no jugular venous distention. No thyroid enlargement, no tenderness.  LUNGS: Normal breath sounds bilaterally, no wheezing, rales,rhonchi or crepitation. No use of accessory muscles of respiration.  CARDIOVASCULAR: S1, S2 normal. No rubs, or gallops. 2/6 systolic murmur is present ABDOMEN: Soft, nontender, nondistended. Bowel sounds present. No organomegaly or mass.  EXTREMITIES: No pedal edema, cyanosis, or clubbing.  Musculoskeletal: Improved Pain in the left flank and lower back regions. No bruising  noted NEUROLOGIC: Cranial nerves II through XII are intact. Muscle strength 5/5 in all extremities. Sensation intact. Gait not checked.  PSYCHIATRIC: The patient is alert and oriented x 3.  SKIN: No obvious rash, lesion, or ulcer.    DATA REVIEW:   CBC  Recent Labs Lab 10/20/16 0434  WBC 10.2  HGB 13.0  HCT 36.9  PLT 244    Chemistries   Recent Labs Lab 10/20/16 0434  NA 136  K 3.5  CL 106  CO2 22  GLUCOSE 163*  BUN 12  CREATININE 0.75  CALCIUM 8.6*     Microbiology Results  Results for orders placed or performed during the hospital encounter of 10/18/16  Urine culture     Status: Abnormal (Preliminary result)   Collection Time: 10/19/16  2:17 AM  Result Value Ref Range Status   Specimen Description URINE, RANDOM  Final   Special Requests NONE  Final   Culture  >=100,000 COLONIES/mL GRAM NEGATIVE RODS (A)  Final   Report Status PENDING  Incomplete  MRSA PCR Screening     Status: None   Collection Time: 10/19/16 10:14 AM  Result Value Ref Range Status   MRSA by PCR NEGATIVE NEGATIVE Final    Comment:        The GeneXpert MRSA Assay (FDA approved for NASAL specimens only), is one component of a comprehensive MRSA colonization surveillance program. It is not intended to diagnose MRSA infection nor to guide or monitor treatment for MRSA infections.     RADIOLOGY:  No results found.   Management plans discussed with the patient, family and they are in agreement.  CODE STATUS:     Code Status Orders        Start     Ordered   10/19/16 0703  Full code  Continuous     10/19/16 0702    Code Status History    Date Active Date Inactive Code Status Order ID Comments User Context   This patient has a current code status but no historical code status.    Advance Directive Documentation   Flowsheet Row Most Recent Value  Type of Advance Directive  Healthcare Power of Attorney  Pre-existing out of facility DNR order (yellow form or pink MOST form)  No data  "MOST" Form in Place?  No data      TOTAL TIME TAKING CARE OF THIS PATIENT: 37 minutes.    Gladstone Lighter M.D on 10/20/2016 at 12:22 PM  Between 7am to 6pm - Pager - [phone]  After 6pm go to [email or web address] - Proofreader  [facility]  Office  [phone]  CC: Primary care physician; [name], FNP   Note: This dictation was prepared with Dragon dictation along with smaller phrase technology. Any transcriptional errors that result from this process are unintentional.

## 2016-10-21 ENCOUNTER — Telehealth: Payer: Self-pay

## 2016-10-21 ENCOUNTER — Other Ambulatory Visit: Payer: Self-pay | Admitting: Family

## 2016-10-21 DIAGNOSIS — N3000 Acute cystitis without hematuria: Secondary | ICD-10-CM

## 2016-10-21 LAB — URINE CULTURE

## 2016-10-21 MED ORDER — FOSFOMYCIN TROMETHAMINE 3 G PO PACK: 3.0000 g | PACK | Freq: Once | ORAL | 0 refills | Status: AC

## 2016-10-21 NOTE — Progress Notes (Signed)
Son is unsure of allergies and I have concern given macrobid. PCN allergic. Dose of monurol.   Advise patient follow up if symptoms persist to let us know.

## 2016-10-21 NOTE — Telephone Encounter (Signed)
Transition Care Management Follow-up Telephone Call  How have you been since you were released from the hospital? fine  Do you understand why you were in the hospital?yes   Do you understand the discharge instrcutions? No questions about instructions, requested for me to call son, Gery PrayBarry to discuss plans  Items Reviewed:  Medications reviewed: took an antibiotic in the hospital, but no changes on discharge to her medications  Allergies reviewed: yes, no changes  Dietary changes reviewed: no changes  Referrals reviewed: Cardiology follow up, scheduled from prior on 11/20 at 1pm.   They have already completed the tests that were ordered on the 13th, so the sone is going to call today to see if they want to move the appt up sooner or to keep it the same.    Functional Questionnaire:   Activities of Daily Living (ADLs):   She states they are independent in the following: no changes in the ADL's States they require assistance with the following: no changes in the ADL's   Any transportation issues/concerns?: no, call son to help arrange appt.   Any patient concerns?   Mom writes down BS and BP, Diastolic mid to high 60-70's normally but in the 50's, may have passed out the other night.  Thinks it may have been syncopal episode. Seems to be better now.  Used insulin in the hospital, highest was 219, went down after insulin, ordered to Split in half the glipizide and take once in the am and again later.  To help keep sugar under control.  Was positive UTI- took antibiotics while there, but no changes for discharge.  Son will watch her this time to see if that returns.  He plans to call cardiology and have her follow up there and then schedule follow up with us after.     Confirmed importance and date/time of follow-up visits scheduled: no appt scheduled at this time   Confirmed with patient if condition begins to worsen call PCP or go to the ER.  Patient was given the Call-a-Nurse line  [phone]: yes verbalized understanding.

## 2016-10-22 DIAGNOSIS — E785 Hyperlipidemia, unspecified: Secondary | ICD-10-CM | POA: Diagnosis not present

## 2016-10-22 DIAGNOSIS — I10 Essential (primary) hypertension: Secondary | ICD-10-CM | POA: Diagnosis not present

## 2016-10-22 DIAGNOSIS — Z9181 History of falling: Secondary | ICD-10-CM | POA: Diagnosis not present

## 2016-10-22 DIAGNOSIS — G629 Polyneuropathy, unspecified: Secondary | ICD-10-CM | POA: Diagnosis not present

## 2016-10-22 DIAGNOSIS — E119 Type 2 diabetes mellitus without complications: Secondary | ICD-10-CM | POA: Diagnosis not present

## 2016-10-22 DIAGNOSIS — Z7982 Long term (current) use of aspirin: Secondary | ICD-10-CM | POA: Diagnosis not present

## 2016-10-22 DIAGNOSIS — Z7984 Long term (current) use of oral hypoglycemic drugs: Secondary | ICD-10-CM | POA: Diagnosis not present

## 2016-10-22 DIAGNOSIS — R55 Syncope and collapse: Secondary | ICD-10-CM | POA: Diagnosis not present

## 2016-10-22 DIAGNOSIS — I69351 Hemiplegia and hemiparesis following cerebral infarction affecting right dominant side: Secondary | ICD-10-CM | POA: Diagnosis not present

## 2016-10-22 DIAGNOSIS — M545 Low back pain: Secondary | ICD-10-CM | POA: Diagnosis not present

## 2016-10-22 DIAGNOSIS — E039 Hypothyroidism, unspecified: Secondary | ICD-10-CM | POA: Diagnosis not present

## 2016-10-22 DIAGNOSIS — I69398 Other sequelae of cerebral infarction: Secondary | ICD-10-CM | POA: Diagnosis not present

## 2016-10-22 DIAGNOSIS — G4730 Sleep apnea, unspecified: Secondary | ICD-10-CM | POA: Diagnosis not present

## 2016-10-23 DIAGNOSIS — E785 Hyperlipidemia, unspecified: Secondary | ICD-10-CM | POA: Diagnosis not present

## 2016-10-23 DIAGNOSIS — G629 Polyneuropathy, unspecified: Secondary | ICD-10-CM | POA: Diagnosis not present

## 2016-10-23 DIAGNOSIS — Z7982 Long term (current) use of aspirin: Secondary | ICD-10-CM | POA: Diagnosis not present

## 2016-10-23 DIAGNOSIS — I69398 Other sequelae of cerebral infarction: Secondary | ICD-10-CM | POA: Diagnosis not present

## 2016-10-23 DIAGNOSIS — E039 Hypothyroidism, unspecified: Secondary | ICD-10-CM | POA: Diagnosis not present

## 2016-10-23 DIAGNOSIS — I10 Essential (primary) hypertension: Secondary | ICD-10-CM | POA: Diagnosis not present

## 2016-10-23 DIAGNOSIS — I69351 Hemiplegia and hemiparesis following cerebral infarction affecting right dominant side: Secondary | ICD-10-CM | POA: Diagnosis not present

## 2016-10-23 DIAGNOSIS — Z9181 History of falling: Secondary | ICD-10-CM | POA: Diagnosis not present

## 2016-10-23 DIAGNOSIS — E119 Type 2 diabetes mellitus without complications: Secondary | ICD-10-CM | POA: Diagnosis not present

## 2016-10-23 DIAGNOSIS — G4730 Sleep apnea, unspecified: Secondary | ICD-10-CM | POA: Diagnosis not present

## 2016-10-23 DIAGNOSIS — Z7984 Long term (current) use of oral hypoglycemic drugs: Secondary | ICD-10-CM | POA: Diagnosis not present

## 2016-10-23 DIAGNOSIS — R55 Syncope and collapse: Secondary | ICD-10-CM | POA: Diagnosis not present

## 2016-10-23 DIAGNOSIS — M545 Low back pain: Secondary | ICD-10-CM | POA: Diagnosis not present

## 2016-10-28 ENCOUNTER — Other Ambulatory Visit: Payer: Medicare Other

## 2016-10-28 ENCOUNTER — Ambulatory Visit: Payer: Medicare Other

## 2016-10-28 DIAGNOSIS — R0602 Shortness of breath: Secondary | ICD-10-CM

## 2016-10-28 DIAGNOSIS — I10 Essential (primary) hypertension: Secondary | ICD-10-CM

## 2016-10-29 DIAGNOSIS — Z7984 Long term (current) use of oral hypoglycemic drugs: Secondary | ICD-10-CM | POA: Diagnosis not present

## 2016-10-29 DIAGNOSIS — R55 Syncope and collapse: Secondary | ICD-10-CM | POA: Diagnosis not present

## 2016-10-29 DIAGNOSIS — E039 Hypothyroidism, unspecified: Secondary | ICD-10-CM | POA: Diagnosis not present

## 2016-10-29 DIAGNOSIS — I69351 Hemiplegia and hemiparesis following cerebral infarction affecting right dominant side: Secondary | ICD-10-CM | POA: Diagnosis not present

## 2016-10-29 DIAGNOSIS — I10 Essential (primary) hypertension: Secondary | ICD-10-CM | POA: Diagnosis not present

## 2016-10-29 DIAGNOSIS — E119 Type 2 diabetes mellitus without complications: Secondary | ICD-10-CM | POA: Diagnosis not present

## 2016-10-29 DIAGNOSIS — M545 Low back pain: Secondary | ICD-10-CM | POA: Diagnosis not present

## 2016-10-29 DIAGNOSIS — Z7982 Long term (current) use of aspirin: Secondary | ICD-10-CM | POA: Diagnosis not present

## 2016-10-29 DIAGNOSIS — I69398 Other sequelae of cerebral infarction: Secondary | ICD-10-CM | POA: Diagnosis not present

## 2016-10-29 DIAGNOSIS — E785 Hyperlipidemia, unspecified: Secondary | ICD-10-CM | POA: Diagnosis not present

## 2016-10-29 DIAGNOSIS — G629 Polyneuropathy, unspecified: Secondary | ICD-10-CM | POA: Diagnosis not present

## 2016-10-29 DIAGNOSIS — G4730 Sleep apnea, unspecified: Secondary | ICD-10-CM | POA: Diagnosis not present

## 2016-10-29 DIAGNOSIS — Z9181 History of falling: Secondary | ICD-10-CM | POA: Diagnosis not present

## 2016-10-31 DIAGNOSIS — Z9181 History of falling: Secondary | ICD-10-CM | POA: Diagnosis not present

## 2016-10-31 DIAGNOSIS — M545 Low back pain: Secondary | ICD-10-CM | POA: Diagnosis not present

## 2016-10-31 DIAGNOSIS — I69398 Other sequelae of cerebral infarction: Secondary | ICD-10-CM | POA: Diagnosis not present

## 2016-10-31 DIAGNOSIS — I10 Essential (primary) hypertension: Secondary | ICD-10-CM | POA: Diagnosis not present

## 2016-10-31 DIAGNOSIS — Z7984 Long term (current) use of oral hypoglycemic drugs: Secondary | ICD-10-CM | POA: Diagnosis not present

## 2016-10-31 DIAGNOSIS — Z7982 Long term (current) use of aspirin: Secondary | ICD-10-CM | POA: Diagnosis not present

## 2016-10-31 DIAGNOSIS — E785 Hyperlipidemia, unspecified: Secondary | ICD-10-CM | POA: Diagnosis not present

## 2016-10-31 DIAGNOSIS — E039 Hypothyroidism, unspecified: Secondary | ICD-10-CM | POA: Diagnosis not present

## 2016-10-31 DIAGNOSIS — R55 Syncope and collapse: Secondary | ICD-10-CM | POA: Diagnosis not present

## 2016-10-31 DIAGNOSIS — G4730 Sleep apnea, unspecified: Secondary | ICD-10-CM | POA: Diagnosis not present

## 2016-10-31 DIAGNOSIS — I69351 Hemiplegia and hemiparesis following cerebral infarction affecting right dominant side: Secondary | ICD-10-CM | POA: Diagnosis not present

## 2016-10-31 DIAGNOSIS — E119 Type 2 diabetes mellitus without complications: Secondary | ICD-10-CM | POA: Diagnosis not present

## 2016-10-31 DIAGNOSIS — G629 Polyneuropathy, unspecified: Secondary | ICD-10-CM | POA: Diagnosis not present

## 2016-11-04 ENCOUNTER — Ambulatory Visit: Payer: Medicare Other | Admitting: Cardiology

## 2016-11-04 DIAGNOSIS — G629 Polyneuropathy, unspecified: Secondary | ICD-10-CM | POA: Diagnosis not present

## 2016-11-04 DIAGNOSIS — M545 Low back pain: Secondary | ICD-10-CM | POA: Diagnosis not present

## 2016-11-04 DIAGNOSIS — G4730 Sleep apnea, unspecified: Secondary | ICD-10-CM | POA: Diagnosis not present

## 2016-11-04 DIAGNOSIS — E119 Type 2 diabetes mellitus without complications: Secondary | ICD-10-CM | POA: Diagnosis not present

## 2016-11-04 DIAGNOSIS — I10 Essential (primary) hypertension: Secondary | ICD-10-CM | POA: Diagnosis not present

## 2016-11-04 DIAGNOSIS — Z7982 Long term (current) use of aspirin: Secondary | ICD-10-CM | POA: Diagnosis not present

## 2016-11-04 DIAGNOSIS — Z9181 History of falling: Secondary | ICD-10-CM | POA: Diagnosis not present

## 2016-11-04 DIAGNOSIS — E785 Hyperlipidemia, unspecified: Secondary | ICD-10-CM | POA: Diagnosis not present

## 2016-11-04 DIAGNOSIS — I69351 Hemiplegia and hemiparesis following cerebral infarction affecting right dominant side: Secondary | ICD-10-CM | POA: Diagnosis not present

## 2016-11-04 DIAGNOSIS — I69398 Other sequelae of cerebral infarction: Secondary | ICD-10-CM | POA: Diagnosis not present

## 2016-11-04 DIAGNOSIS — R55 Syncope and collapse: Secondary | ICD-10-CM | POA: Diagnosis not present

## 2016-11-04 DIAGNOSIS — Z7984 Long term (current) use of oral hypoglycemic drugs: Secondary | ICD-10-CM | POA: Diagnosis not present

## 2016-11-04 DIAGNOSIS — E039 Hypothyroidism, unspecified: Secondary | ICD-10-CM | POA: Diagnosis not present

## 2016-11-05 DIAGNOSIS — E119 Type 2 diabetes mellitus without complications: Secondary | ICD-10-CM | POA: Diagnosis not present

## 2016-11-05 DIAGNOSIS — R55 Syncope and collapse: Secondary | ICD-10-CM | POA: Diagnosis not present

## 2016-11-05 DIAGNOSIS — I69398 Other sequelae of cerebral infarction: Secondary | ICD-10-CM | POA: Diagnosis not present

## 2016-11-05 DIAGNOSIS — G4730 Sleep apnea, unspecified: Secondary | ICD-10-CM | POA: Diagnosis not present

## 2016-11-05 DIAGNOSIS — E785 Hyperlipidemia, unspecified: Secondary | ICD-10-CM | POA: Diagnosis not present

## 2016-11-05 DIAGNOSIS — I10 Essential (primary) hypertension: Secondary | ICD-10-CM | POA: Diagnosis not present

## 2016-11-05 DIAGNOSIS — Z7984 Long term (current) use of oral hypoglycemic drugs: Secondary | ICD-10-CM | POA: Diagnosis not present

## 2016-11-05 DIAGNOSIS — E039 Hypothyroidism, unspecified: Secondary | ICD-10-CM | POA: Diagnosis not present

## 2016-11-05 DIAGNOSIS — M545 Low back pain: Secondary | ICD-10-CM | POA: Diagnosis not present

## 2016-11-05 DIAGNOSIS — I69351 Hemiplegia and hemiparesis following cerebral infarction affecting right dominant side: Secondary | ICD-10-CM | POA: Diagnosis not present

## 2016-11-05 DIAGNOSIS — Z9181 History of falling: Secondary | ICD-10-CM | POA: Diagnosis not present

## 2016-11-05 DIAGNOSIS — G629 Polyneuropathy, unspecified: Secondary | ICD-10-CM | POA: Diagnosis not present

## 2016-11-05 DIAGNOSIS — Z7982 Long term (current) use of aspirin: Secondary | ICD-10-CM | POA: Diagnosis not present

## 2016-11-06 DIAGNOSIS — E785 Hyperlipidemia, unspecified: Secondary | ICD-10-CM | POA: Diagnosis not present

## 2016-11-06 DIAGNOSIS — I10 Essential (primary) hypertension: Secondary | ICD-10-CM | POA: Diagnosis not present

## 2016-11-06 DIAGNOSIS — R55 Syncope and collapse: Secondary | ICD-10-CM | POA: Diagnosis not present

## 2016-11-06 DIAGNOSIS — Z7984 Long term (current) use of oral hypoglycemic drugs: Secondary | ICD-10-CM | POA: Diagnosis not present

## 2016-11-06 DIAGNOSIS — G629 Polyneuropathy, unspecified: Secondary | ICD-10-CM | POA: Diagnosis not present

## 2016-11-06 DIAGNOSIS — E039 Hypothyroidism, unspecified: Secondary | ICD-10-CM | POA: Diagnosis not present

## 2016-11-06 DIAGNOSIS — Z7982 Long term (current) use of aspirin: Secondary | ICD-10-CM | POA: Diagnosis not present

## 2016-11-06 DIAGNOSIS — E119 Type 2 diabetes mellitus without complications: Secondary | ICD-10-CM | POA: Diagnosis not present

## 2016-11-06 DIAGNOSIS — I69351 Hemiplegia and hemiparesis following cerebral infarction affecting right dominant side: Secondary | ICD-10-CM | POA: Diagnosis not present

## 2016-11-06 DIAGNOSIS — G4730 Sleep apnea, unspecified: Secondary | ICD-10-CM | POA: Diagnosis not present

## 2016-11-06 DIAGNOSIS — I69398 Other sequelae of cerebral infarction: Secondary | ICD-10-CM | POA: Diagnosis not present

## 2016-11-06 DIAGNOSIS — Z9181 History of falling: Secondary | ICD-10-CM | POA: Diagnosis not present

## 2016-11-06 DIAGNOSIS — M545 Low back pain: Secondary | ICD-10-CM | POA: Diagnosis not present

## 2016-11-08 DIAGNOSIS — M545 Low back pain: Secondary | ICD-10-CM | POA: Diagnosis not present

## 2016-11-08 DIAGNOSIS — Z7984 Long term (current) use of oral hypoglycemic drugs: Secondary | ICD-10-CM | POA: Diagnosis not present

## 2016-11-08 DIAGNOSIS — I69351 Hemiplegia and hemiparesis following cerebral infarction affecting right dominant side: Secondary | ICD-10-CM | POA: Diagnosis not present

## 2016-11-08 DIAGNOSIS — G629 Polyneuropathy, unspecified: Secondary | ICD-10-CM | POA: Diagnosis not present

## 2016-11-08 DIAGNOSIS — R55 Syncope and collapse: Secondary | ICD-10-CM | POA: Diagnosis not present

## 2016-11-08 DIAGNOSIS — Z9181 History of falling: Secondary | ICD-10-CM | POA: Diagnosis not present

## 2016-11-08 DIAGNOSIS — G4730 Sleep apnea, unspecified: Secondary | ICD-10-CM | POA: Diagnosis not present

## 2016-11-08 DIAGNOSIS — I10 Essential (primary) hypertension: Secondary | ICD-10-CM | POA: Diagnosis not present

## 2016-11-08 DIAGNOSIS — E039 Hypothyroidism, unspecified: Secondary | ICD-10-CM | POA: Diagnosis not present

## 2016-11-08 DIAGNOSIS — I69398 Other sequelae of cerebral infarction: Secondary | ICD-10-CM | POA: Diagnosis not present

## 2016-11-08 DIAGNOSIS — E785 Hyperlipidemia, unspecified: Secondary | ICD-10-CM | POA: Diagnosis not present

## 2016-11-08 DIAGNOSIS — E119 Type 2 diabetes mellitus without complications: Secondary | ICD-10-CM | POA: Diagnosis not present

## 2016-11-08 DIAGNOSIS — Z7982 Long term (current) use of aspirin: Secondary | ICD-10-CM | POA: Diagnosis not present

## 2016-11-12 DIAGNOSIS — G4730 Sleep apnea, unspecified: Secondary | ICD-10-CM | POA: Diagnosis not present

## 2016-11-12 DIAGNOSIS — Z9181 History of falling: Secondary | ICD-10-CM | POA: Diagnosis not present

## 2016-11-12 DIAGNOSIS — I69398 Other sequelae of cerebral infarction: Secondary | ICD-10-CM | POA: Diagnosis not present

## 2016-11-12 DIAGNOSIS — I10 Essential (primary) hypertension: Secondary | ICD-10-CM | POA: Diagnosis not present

## 2016-11-12 DIAGNOSIS — Z7984 Long term (current) use of oral hypoglycemic drugs: Secondary | ICD-10-CM | POA: Diagnosis not present

## 2016-11-12 DIAGNOSIS — I69351 Hemiplegia and hemiparesis following cerebral infarction affecting right dominant side: Secondary | ICD-10-CM | POA: Diagnosis not present

## 2016-11-12 DIAGNOSIS — G629 Polyneuropathy, unspecified: Secondary | ICD-10-CM | POA: Diagnosis not present

## 2016-11-12 DIAGNOSIS — R55 Syncope and collapse: Secondary | ICD-10-CM | POA: Diagnosis not present

## 2016-11-12 DIAGNOSIS — E785 Hyperlipidemia, unspecified: Secondary | ICD-10-CM | POA: Diagnosis not present

## 2016-11-12 DIAGNOSIS — E039 Hypothyroidism, unspecified: Secondary | ICD-10-CM | POA: Diagnosis not present

## 2016-11-12 DIAGNOSIS — Z7982 Long term (current) use of aspirin: Secondary | ICD-10-CM | POA: Diagnosis not present

## 2016-11-12 DIAGNOSIS — M545 Low back pain: Secondary | ICD-10-CM | POA: Diagnosis not present

## 2016-11-12 DIAGNOSIS — E119 Type 2 diabetes mellitus without complications: Secondary | ICD-10-CM | POA: Diagnosis not present

## 2016-11-13 ENCOUNTER — Ambulatory Visit: Payer: Medicare Other | Admitting: Cardiology

## 2016-11-13 DIAGNOSIS — I69398 Other sequelae of cerebral infarction: Secondary | ICD-10-CM | POA: Diagnosis not present

## 2016-11-13 DIAGNOSIS — Z7982 Long term (current) use of aspirin: Secondary | ICD-10-CM | POA: Diagnosis not present

## 2016-11-13 DIAGNOSIS — M545 Low back pain: Secondary | ICD-10-CM | POA: Diagnosis not present

## 2016-11-13 DIAGNOSIS — I69351 Hemiplegia and hemiparesis following cerebral infarction affecting right dominant side: Secondary | ICD-10-CM | POA: Diagnosis not present

## 2016-11-13 DIAGNOSIS — Z7984 Long term (current) use of oral hypoglycemic drugs: Secondary | ICD-10-CM | POA: Diagnosis not present

## 2016-11-13 DIAGNOSIS — I10 Essential (primary) hypertension: Secondary | ICD-10-CM | POA: Diagnosis not present

## 2016-11-13 DIAGNOSIS — R55 Syncope and collapse: Secondary | ICD-10-CM | POA: Diagnosis not present

## 2016-11-13 DIAGNOSIS — E119 Type 2 diabetes mellitus without complications: Secondary | ICD-10-CM | POA: Diagnosis not present

## 2016-11-13 DIAGNOSIS — G629 Polyneuropathy, unspecified: Secondary | ICD-10-CM | POA: Diagnosis not present

## 2016-11-13 DIAGNOSIS — E785 Hyperlipidemia, unspecified: Secondary | ICD-10-CM | POA: Diagnosis not present

## 2016-11-13 DIAGNOSIS — E039 Hypothyroidism, unspecified: Secondary | ICD-10-CM | POA: Diagnosis not present

## 2016-11-13 DIAGNOSIS — Z9181 History of falling: Secondary | ICD-10-CM | POA: Diagnosis not present

## 2016-11-13 DIAGNOSIS — G4730 Sleep apnea, unspecified: Secondary | ICD-10-CM | POA: Diagnosis not present

## 2016-11-14 DIAGNOSIS — E039 Hypothyroidism, unspecified: Secondary | ICD-10-CM | POA: Diagnosis not present

## 2016-11-14 DIAGNOSIS — Z7982 Long term (current) use of aspirin: Secondary | ICD-10-CM | POA: Diagnosis not present

## 2016-11-14 DIAGNOSIS — M545 Low back pain: Secondary | ICD-10-CM | POA: Diagnosis not present

## 2016-11-14 DIAGNOSIS — I69351 Hemiplegia and hemiparesis following cerebral infarction affecting right dominant side: Secondary | ICD-10-CM | POA: Diagnosis not present

## 2016-11-14 DIAGNOSIS — E785 Hyperlipidemia, unspecified: Secondary | ICD-10-CM | POA: Diagnosis not present

## 2016-11-14 DIAGNOSIS — G629 Polyneuropathy, unspecified: Secondary | ICD-10-CM | POA: Diagnosis not present

## 2016-11-14 DIAGNOSIS — Z9181 History of falling: Secondary | ICD-10-CM | POA: Diagnosis not present

## 2016-11-14 DIAGNOSIS — I69398 Other sequelae of cerebral infarction: Secondary | ICD-10-CM | POA: Diagnosis not present

## 2016-11-14 DIAGNOSIS — I10 Essential (primary) hypertension: Secondary | ICD-10-CM | POA: Diagnosis not present

## 2016-11-14 DIAGNOSIS — G4730 Sleep apnea, unspecified: Secondary | ICD-10-CM | POA: Diagnosis not present

## 2016-11-14 DIAGNOSIS — Z7984 Long term (current) use of oral hypoglycemic drugs: Secondary | ICD-10-CM | POA: Diagnosis not present

## 2016-11-14 DIAGNOSIS — R55 Syncope and collapse: Secondary | ICD-10-CM | POA: Diagnosis not present

## 2016-11-14 DIAGNOSIS — E119 Type 2 diabetes mellitus without complications: Secondary | ICD-10-CM | POA: Diagnosis not present

## 2016-11-15 DIAGNOSIS — E119 Type 2 diabetes mellitus without complications: Secondary | ICD-10-CM | POA: Diagnosis not present

## 2016-11-15 DIAGNOSIS — E039 Hypothyroidism, unspecified: Secondary | ICD-10-CM | POA: Diagnosis not present

## 2016-11-15 DIAGNOSIS — Z9181 History of falling: Secondary | ICD-10-CM | POA: Diagnosis not present

## 2016-11-15 DIAGNOSIS — R55 Syncope and collapse: Secondary | ICD-10-CM | POA: Diagnosis not present

## 2016-11-15 DIAGNOSIS — Z7982 Long term (current) use of aspirin: Secondary | ICD-10-CM | POA: Diagnosis not present

## 2016-11-15 DIAGNOSIS — I10 Essential (primary) hypertension: Secondary | ICD-10-CM | POA: Diagnosis not present

## 2016-11-15 DIAGNOSIS — Z7984 Long term (current) use of oral hypoglycemic drugs: Secondary | ICD-10-CM | POA: Diagnosis not present

## 2016-11-15 DIAGNOSIS — G629 Polyneuropathy, unspecified: Secondary | ICD-10-CM | POA: Diagnosis not present

## 2016-11-15 DIAGNOSIS — M545 Low back pain: Secondary | ICD-10-CM | POA: Diagnosis not present

## 2016-11-15 DIAGNOSIS — I69351 Hemiplegia and hemiparesis following cerebral infarction affecting right dominant side: Secondary | ICD-10-CM | POA: Diagnosis not present

## 2016-11-15 DIAGNOSIS — I69398 Other sequelae of cerebral infarction: Secondary | ICD-10-CM | POA: Diagnosis not present

## 2016-11-15 DIAGNOSIS — E785 Hyperlipidemia, unspecified: Secondary | ICD-10-CM | POA: Diagnosis not present

## 2016-11-15 DIAGNOSIS — G4730 Sleep apnea, unspecified: Secondary | ICD-10-CM | POA: Diagnosis not present

## 2016-11-19 DIAGNOSIS — Z7982 Long term (current) use of aspirin: Secondary | ICD-10-CM | POA: Diagnosis not present

## 2016-11-19 DIAGNOSIS — E039 Hypothyroidism, unspecified: Secondary | ICD-10-CM | POA: Diagnosis not present

## 2016-11-19 DIAGNOSIS — E119 Type 2 diabetes mellitus without complications: Secondary | ICD-10-CM | POA: Diagnosis not present

## 2016-11-19 DIAGNOSIS — R55 Syncope and collapse: Secondary | ICD-10-CM | POA: Diagnosis not present

## 2016-11-19 DIAGNOSIS — G629 Polyneuropathy, unspecified: Secondary | ICD-10-CM | POA: Diagnosis not present

## 2016-11-19 DIAGNOSIS — G4730 Sleep apnea, unspecified: Secondary | ICD-10-CM | POA: Diagnosis not present

## 2016-11-19 DIAGNOSIS — I69351 Hemiplegia and hemiparesis following cerebral infarction affecting right dominant side: Secondary | ICD-10-CM | POA: Diagnosis not present

## 2016-11-19 DIAGNOSIS — Z7984 Long term (current) use of oral hypoglycemic drugs: Secondary | ICD-10-CM | POA: Diagnosis not present

## 2016-11-19 DIAGNOSIS — I69398 Other sequelae of cerebral infarction: Secondary | ICD-10-CM | POA: Diagnosis not present

## 2016-11-19 DIAGNOSIS — I10 Essential (primary) hypertension: Secondary | ICD-10-CM | POA: Diagnosis not present

## 2016-11-19 DIAGNOSIS — M545 Low back pain: Secondary | ICD-10-CM | POA: Diagnosis not present

## 2016-11-19 DIAGNOSIS — Z9181 History of falling: Secondary | ICD-10-CM | POA: Diagnosis not present

## 2016-11-19 DIAGNOSIS — E785 Hyperlipidemia, unspecified: Secondary | ICD-10-CM | POA: Diagnosis not present

## 2016-11-20 ENCOUNTER — Ambulatory Visit: Payer: Medicare Other | Admitting: Cardiology

## 2016-11-20 DIAGNOSIS — Z7982 Long term (current) use of aspirin: Secondary | ICD-10-CM | POA: Diagnosis not present

## 2016-11-20 DIAGNOSIS — E039 Hypothyroidism, unspecified: Secondary | ICD-10-CM | POA: Diagnosis not present

## 2016-11-20 DIAGNOSIS — I69351 Hemiplegia and hemiparesis following cerebral infarction affecting right dominant side: Secondary | ICD-10-CM | POA: Diagnosis not present

## 2016-11-20 DIAGNOSIS — I10 Essential (primary) hypertension: Secondary | ICD-10-CM | POA: Diagnosis not present

## 2016-11-20 DIAGNOSIS — E785 Hyperlipidemia, unspecified: Secondary | ICD-10-CM | POA: Diagnosis not present

## 2016-11-20 DIAGNOSIS — R55 Syncope and collapse: Secondary | ICD-10-CM | POA: Diagnosis not present

## 2016-11-20 DIAGNOSIS — Z7984 Long term (current) use of oral hypoglycemic drugs: Secondary | ICD-10-CM | POA: Diagnosis not present

## 2016-11-20 DIAGNOSIS — G4730 Sleep apnea, unspecified: Secondary | ICD-10-CM | POA: Diagnosis not present

## 2016-11-20 DIAGNOSIS — E119 Type 2 diabetes mellitus without complications: Secondary | ICD-10-CM | POA: Diagnosis not present

## 2016-11-20 DIAGNOSIS — I69398 Other sequelae of cerebral infarction: Secondary | ICD-10-CM | POA: Diagnosis not present

## 2016-11-20 DIAGNOSIS — M545 Low back pain: Secondary | ICD-10-CM | POA: Diagnosis not present

## 2016-11-20 DIAGNOSIS — Z9181 History of falling: Secondary | ICD-10-CM | POA: Diagnosis not present

## 2016-11-20 DIAGNOSIS — G629 Polyneuropathy, unspecified: Secondary | ICD-10-CM | POA: Diagnosis not present

## 2016-11-21 DIAGNOSIS — I69398 Other sequelae of cerebral infarction: Secondary | ICD-10-CM | POA: Diagnosis not present

## 2016-11-21 DIAGNOSIS — Z7984 Long term (current) use of oral hypoglycemic drugs: Secondary | ICD-10-CM | POA: Diagnosis not present

## 2016-11-21 DIAGNOSIS — Z9181 History of falling: Secondary | ICD-10-CM | POA: Diagnosis not present

## 2016-11-21 DIAGNOSIS — I10 Essential (primary) hypertension: Secondary | ICD-10-CM | POA: Diagnosis not present

## 2016-11-21 DIAGNOSIS — M545 Low back pain: Secondary | ICD-10-CM | POA: Diagnosis not present

## 2016-11-21 DIAGNOSIS — E039 Hypothyroidism, unspecified: Secondary | ICD-10-CM | POA: Diagnosis not present

## 2016-11-21 DIAGNOSIS — E119 Type 2 diabetes mellitus without complications: Secondary | ICD-10-CM | POA: Diagnosis not present

## 2016-11-21 DIAGNOSIS — R55 Syncope and collapse: Secondary | ICD-10-CM | POA: Diagnosis not present

## 2016-11-21 DIAGNOSIS — E785 Hyperlipidemia, unspecified: Secondary | ICD-10-CM | POA: Diagnosis not present

## 2016-11-21 DIAGNOSIS — G4730 Sleep apnea, unspecified: Secondary | ICD-10-CM | POA: Diagnosis not present

## 2016-11-21 DIAGNOSIS — I69351 Hemiplegia and hemiparesis following cerebral infarction affecting right dominant side: Secondary | ICD-10-CM | POA: Diagnosis not present

## 2016-11-21 DIAGNOSIS — Z7982 Long term (current) use of aspirin: Secondary | ICD-10-CM | POA: Diagnosis not present

## 2016-11-21 DIAGNOSIS — G629 Polyneuropathy, unspecified: Secondary | ICD-10-CM | POA: Diagnosis not present

## 2016-11-22 DIAGNOSIS — E039 Hypothyroidism, unspecified: Secondary | ICD-10-CM | POA: Diagnosis not present

## 2016-11-22 DIAGNOSIS — G4730 Sleep apnea, unspecified: Secondary | ICD-10-CM | POA: Diagnosis not present

## 2016-11-22 DIAGNOSIS — R55 Syncope and collapse: Secondary | ICD-10-CM | POA: Diagnosis not present

## 2016-11-22 DIAGNOSIS — G629 Polyneuropathy, unspecified: Secondary | ICD-10-CM | POA: Diagnosis not present

## 2016-11-22 DIAGNOSIS — I69351 Hemiplegia and hemiparesis following cerebral infarction affecting right dominant side: Secondary | ICD-10-CM | POA: Diagnosis not present

## 2016-11-22 DIAGNOSIS — Z7984 Long term (current) use of oral hypoglycemic drugs: Secondary | ICD-10-CM | POA: Diagnosis not present

## 2016-11-22 DIAGNOSIS — E119 Type 2 diabetes mellitus without complications: Secondary | ICD-10-CM | POA: Diagnosis not present

## 2016-11-22 DIAGNOSIS — I10 Essential (primary) hypertension: Secondary | ICD-10-CM | POA: Diagnosis not present

## 2016-11-22 DIAGNOSIS — I69398 Other sequelae of cerebral infarction: Secondary | ICD-10-CM | POA: Diagnosis not present

## 2016-11-22 DIAGNOSIS — M545 Low back pain: Secondary | ICD-10-CM | POA: Diagnosis not present

## 2016-11-22 DIAGNOSIS — Z7982 Long term (current) use of aspirin: Secondary | ICD-10-CM | POA: Diagnosis not present

## 2016-11-22 DIAGNOSIS — Z9181 History of falling: Secondary | ICD-10-CM | POA: Diagnosis not present

## 2016-11-22 DIAGNOSIS — E785 Hyperlipidemia, unspecified: Secondary | ICD-10-CM | POA: Diagnosis not present

## 2016-11-25 DIAGNOSIS — I69398 Other sequelae of cerebral infarction: Secondary | ICD-10-CM | POA: Diagnosis not present

## 2016-11-25 DIAGNOSIS — R55 Syncope and collapse: Secondary | ICD-10-CM | POA: Diagnosis not present

## 2016-11-25 DIAGNOSIS — M545 Low back pain: Secondary | ICD-10-CM | POA: Diagnosis not present

## 2016-11-25 DIAGNOSIS — Z7982 Long term (current) use of aspirin: Secondary | ICD-10-CM | POA: Diagnosis not present

## 2016-11-25 DIAGNOSIS — G629 Polyneuropathy, unspecified: Secondary | ICD-10-CM | POA: Diagnosis not present

## 2016-11-25 DIAGNOSIS — Z7984 Long term (current) use of oral hypoglycemic drugs: Secondary | ICD-10-CM | POA: Diagnosis not present

## 2016-11-25 DIAGNOSIS — Z9181 History of falling: Secondary | ICD-10-CM | POA: Diagnosis not present

## 2016-11-25 DIAGNOSIS — E785 Hyperlipidemia, unspecified: Secondary | ICD-10-CM | POA: Diagnosis not present

## 2016-11-25 DIAGNOSIS — G4730 Sleep apnea, unspecified: Secondary | ICD-10-CM | POA: Diagnosis not present

## 2016-11-25 DIAGNOSIS — I69351 Hemiplegia and hemiparesis following cerebral infarction affecting right dominant side: Secondary | ICD-10-CM | POA: Diagnosis not present

## 2016-11-25 DIAGNOSIS — E039 Hypothyroidism, unspecified: Secondary | ICD-10-CM | POA: Diagnosis not present

## 2016-11-25 DIAGNOSIS — E119 Type 2 diabetes mellitus without complications: Secondary | ICD-10-CM | POA: Diagnosis not present

## 2016-11-25 DIAGNOSIS — I10 Essential (primary) hypertension: Secondary | ICD-10-CM | POA: Diagnosis not present

## 2016-11-27 DIAGNOSIS — R55 Syncope and collapse: Secondary | ICD-10-CM | POA: Diagnosis not present

## 2016-11-27 DIAGNOSIS — G629 Polyneuropathy, unspecified: Secondary | ICD-10-CM | POA: Diagnosis not present

## 2016-11-27 DIAGNOSIS — Z9181 History of falling: Secondary | ICD-10-CM | POA: Diagnosis not present

## 2016-11-27 DIAGNOSIS — I69398 Other sequelae of cerebral infarction: Secondary | ICD-10-CM | POA: Diagnosis not present

## 2016-11-27 DIAGNOSIS — E785 Hyperlipidemia, unspecified: Secondary | ICD-10-CM | POA: Diagnosis not present

## 2016-11-27 DIAGNOSIS — M545 Low back pain: Secondary | ICD-10-CM | POA: Diagnosis not present

## 2016-11-27 DIAGNOSIS — E039 Hypothyroidism, unspecified: Secondary | ICD-10-CM | POA: Diagnosis not present

## 2016-11-27 DIAGNOSIS — I69351 Hemiplegia and hemiparesis following cerebral infarction affecting right dominant side: Secondary | ICD-10-CM | POA: Diagnosis not present

## 2016-11-27 DIAGNOSIS — G4730 Sleep apnea, unspecified: Secondary | ICD-10-CM | POA: Diagnosis not present

## 2016-11-27 DIAGNOSIS — Z7982 Long term (current) use of aspirin: Secondary | ICD-10-CM | POA: Diagnosis not present

## 2016-11-27 DIAGNOSIS — I10 Essential (primary) hypertension: Secondary | ICD-10-CM | POA: Diagnosis not present

## 2016-11-27 DIAGNOSIS — E119 Type 2 diabetes mellitus without complications: Secondary | ICD-10-CM | POA: Diagnosis not present

## 2016-11-27 DIAGNOSIS — Z7984 Long term (current) use of oral hypoglycemic drugs: Secondary | ICD-10-CM | POA: Diagnosis not present

## 2016-12-02 DIAGNOSIS — Z7984 Long term (current) use of oral hypoglycemic drugs: Secondary | ICD-10-CM | POA: Diagnosis not present

## 2016-12-02 DIAGNOSIS — R55 Syncope and collapse: Secondary | ICD-10-CM | POA: Diagnosis not present

## 2016-12-02 DIAGNOSIS — E785 Hyperlipidemia, unspecified: Secondary | ICD-10-CM | POA: Diagnosis not present

## 2016-12-02 DIAGNOSIS — I10 Essential (primary) hypertension: Secondary | ICD-10-CM | POA: Diagnosis not present

## 2016-12-02 DIAGNOSIS — G4730 Sleep apnea, unspecified: Secondary | ICD-10-CM | POA: Diagnosis not present

## 2016-12-02 DIAGNOSIS — Z9181 History of falling: Secondary | ICD-10-CM | POA: Diagnosis not present

## 2016-12-02 DIAGNOSIS — M545 Low back pain: Secondary | ICD-10-CM | POA: Diagnosis not present

## 2016-12-02 DIAGNOSIS — E119 Type 2 diabetes mellitus without complications: Secondary | ICD-10-CM | POA: Diagnosis not present

## 2016-12-02 DIAGNOSIS — I69398 Other sequelae of cerebral infarction: Secondary | ICD-10-CM | POA: Diagnosis not present

## 2016-12-02 DIAGNOSIS — I69351 Hemiplegia and hemiparesis following cerebral infarction affecting right dominant side: Secondary | ICD-10-CM | POA: Diagnosis not present

## 2016-12-02 DIAGNOSIS — E039 Hypothyroidism, unspecified: Secondary | ICD-10-CM | POA: Diagnosis not present

## 2016-12-02 DIAGNOSIS — G629 Polyneuropathy, unspecified: Secondary | ICD-10-CM | POA: Diagnosis not present

## 2016-12-02 DIAGNOSIS — Z7982 Long term (current) use of aspirin: Secondary | ICD-10-CM | POA: Diagnosis not present

## 2016-12-04 DIAGNOSIS — Z9181 History of falling: Secondary | ICD-10-CM | POA: Diagnosis not present

## 2016-12-04 DIAGNOSIS — I10 Essential (primary) hypertension: Secondary | ICD-10-CM | POA: Diagnosis not present

## 2016-12-04 DIAGNOSIS — R55 Syncope and collapse: Secondary | ICD-10-CM | POA: Diagnosis not present

## 2016-12-04 DIAGNOSIS — M545 Low back pain: Secondary | ICD-10-CM | POA: Diagnosis not present

## 2016-12-04 DIAGNOSIS — G4730 Sleep apnea, unspecified: Secondary | ICD-10-CM | POA: Diagnosis not present

## 2016-12-04 DIAGNOSIS — Z7984 Long term (current) use of oral hypoglycemic drugs: Secondary | ICD-10-CM | POA: Diagnosis not present

## 2016-12-04 DIAGNOSIS — E039 Hypothyroidism, unspecified: Secondary | ICD-10-CM | POA: Diagnosis not present

## 2016-12-04 DIAGNOSIS — E119 Type 2 diabetes mellitus without complications: Secondary | ICD-10-CM | POA: Diagnosis not present

## 2016-12-04 DIAGNOSIS — Z7982 Long term (current) use of aspirin: Secondary | ICD-10-CM | POA: Diagnosis not present

## 2016-12-04 DIAGNOSIS — E785 Hyperlipidemia, unspecified: Secondary | ICD-10-CM | POA: Diagnosis not present

## 2016-12-04 DIAGNOSIS — I69398 Other sequelae of cerebral infarction: Secondary | ICD-10-CM | POA: Diagnosis not present

## 2016-12-04 DIAGNOSIS — G629 Polyneuropathy, unspecified: Secondary | ICD-10-CM | POA: Diagnosis not present

## 2016-12-04 DIAGNOSIS — I69351 Hemiplegia and hemiparesis following cerebral infarction affecting right dominant side: Secondary | ICD-10-CM | POA: Diagnosis not present

## 2016-12-10 ENCOUNTER — Telehealth: Payer: Self-pay | Admitting: Family

## 2016-12-10 DIAGNOSIS — E785 Hyperlipidemia, unspecified: Secondary | ICD-10-CM | POA: Diagnosis not present

## 2016-12-10 DIAGNOSIS — I10 Essential (primary) hypertension: Secondary | ICD-10-CM | POA: Diagnosis not present

## 2016-12-10 DIAGNOSIS — G4730 Sleep apnea, unspecified: Secondary | ICD-10-CM | POA: Diagnosis not present

## 2016-12-10 DIAGNOSIS — M545 Low back pain: Secondary | ICD-10-CM | POA: Diagnosis not present

## 2016-12-10 DIAGNOSIS — Z7982 Long term (current) use of aspirin: Secondary | ICD-10-CM | POA: Diagnosis not present

## 2016-12-10 DIAGNOSIS — I69398 Other sequelae of cerebral infarction: Secondary | ICD-10-CM | POA: Diagnosis not present

## 2016-12-10 DIAGNOSIS — E119 Type 2 diabetes mellitus without complications: Secondary | ICD-10-CM | POA: Diagnosis not present

## 2016-12-10 DIAGNOSIS — Z9181 History of falling: Secondary | ICD-10-CM | POA: Diagnosis not present

## 2016-12-10 DIAGNOSIS — R55 Syncope and collapse: Secondary | ICD-10-CM | POA: Diagnosis not present

## 2016-12-10 DIAGNOSIS — G629 Polyneuropathy, unspecified: Secondary | ICD-10-CM | POA: Diagnosis not present

## 2016-12-10 DIAGNOSIS — I69351 Hemiplegia and hemiparesis following cerebral infarction affecting right dominant side: Secondary | ICD-10-CM | POA: Diagnosis not present

## 2016-12-10 DIAGNOSIS — Z7984 Long term (current) use of oral hypoglycemic drugs: Secondary | ICD-10-CM | POA: Diagnosis not present

## 2016-12-10 DIAGNOSIS — E039 Hypothyroidism, unspecified: Secondary | ICD-10-CM | POA: Diagnosis not present

## 2016-12-10 NOTE — Telephone Encounter (Signed)
Kelly from [facility] called and stated that pt has had elevated bp over the last several visits. Today her was184/80 , pt is taking medication as prescribed, is complaining of slight headache and little bit of dizziness. Please advise, thank you!  Call [place] @ [phone]

## 2016-12-11 ENCOUNTER — Telehealth: Payer: Self-pay | Admitting: Family

## 2016-12-11 DIAGNOSIS — E039 Hypothyroidism, unspecified: Secondary | ICD-10-CM | POA: Diagnosis not present

## 2016-12-11 DIAGNOSIS — R55 Syncope and collapse: Secondary | ICD-10-CM | POA: Diagnosis not present

## 2016-12-11 DIAGNOSIS — Z7984 Long term (current) use of oral hypoglycemic drugs: Secondary | ICD-10-CM | POA: Diagnosis not present

## 2016-12-11 DIAGNOSIS — Z7982 Long term (current) use of aspirin: Secondary | ICD-10-CM | POA: Diagnosis not present

## 2016-12-11 DIAGNOSIS — I10 Essential (primary) hypertension: Secondary | ICD-10-CM | POA: Diagnosis not present

## 2016-12-11 DIAGNOSIS — I69351 Hemiplegia and hemiparesis following cerebral infarction affecting right dominant side: Secondary | ICD-10-CM | POA: Diagnosis not present

## 2016-12-11 DIAGNOSIS — E785 Hyperlipidemia, unspecified: Secondary | ICD-10-CM | POA: Diagnosis not present

## 2016-12-11 DIAGNOSIS — M545 Low back pain: Secondary | ICD-10-CM | POA: Diagnosis not present

## 2016-12-11 DIAGNOSIS — E119 Type 2 diabetes mellitus without complications: Secondary | ICD-10-CM | POA: Diagnosis not present

## 2016-12-11 DIAGNOSIS — G629 Polyneuropathy, unspecified: Secondary | ICD-10-CM | POA: Diagnosis not present

## 2016-12-11 DIAGNOSIS — I69398 Other sequelae of cerebral infarction: Secondary | ICD-10-CM | POA: Diagnosis not present

## 2016-12-11 DIAGNOSIS — Z9181 History of falling: Secondary | ICD-10-CM | POA: Diagnosis not present

## 2016-12-11 DIAGNOSIS — G4730 Sleep apnea, unspecified: Secondary | ICD-10-CM | POA: Diagnosis not present

## 2016-12-11 NOTE — Telephone Encounter (Signed)
Please see other Telephone Note

## 2016-12-11 NOTE — Telephone Encounter (Signed)
Called [facility] Triage with all the information. They are going to have a nurse call, I gave both phone numbers.

## 2016-12-11 NOTE — Telephone Encounter (Signed)
Patients # [phone]

## 2016-12-11 NOTE — Telephone Encounter (Signed)
Please advise 

## 2016-12-11 NOTE — Telephone Encounter (Signed)
I spoke with patient and she stated that she didn't need to go to Er.   [facility] 0837am  was unable to reach patient by phone.   They tired secondary numbers for son unable to reach .

## 2016-12-11 NOTE — Telephone Encounter (Signed)
Left message for kelly from [facility] to return call back.

## 2016-12-11 NOTE — Telephone Encounter (Signed)
TELEPHONE ADVICE RECORD [facility]  Patient Name: Alexandra RoanBETTY English  DOB: [birth year]    Initial Comment Caller is having stroke like s/s, elevated BP for a week. yesterday it was 184/80 with dizziness and headaches.    Nurse Assessment      Guidelines    Guideline Title Affirmed Question Affirmed Notes       Final Disposition User   FINAL ATTEMPT MADE - no message left Odis LusterBowers, RN, Marissa Nestlehonda    Comments  Amanda w/ the office called info over - caller or patient was not on the line. Secondary number is the Alexandra English. Would like to have someone get in touch. Original call came in from [facility] company per MauriceAmanda.  Unable to reach patient or son at the phone numbers given after several attempts at each number.

## 2016-12-11 NOTE — Telephone Encounter (Signed)
Left message for patient to return call back.  

## 2016-12-11 NOTE — Telephone Encounter (Signed)
Please call right now Triage  Concern for CVA- she needs EMS

## 2016-12-11 NOTE — Telephone Encounter (Signed)
[facility] unable to reach

## 2016-12-11 NOTE — Telephone Encounter (Signed)
We are unable to reach pt-I have concern she is having signs of stroke. See prior note from this morning Please get in touch with her; please have [facility] call as well.

## 2016-12-11 NOTE — Telephone Encounter (Signed)
Spoken to patient, Patient stated she has those SX every now and then.  Patient stated she will not go to ED or Urgent care to be evaluated for stroke. Patient does not believe she is having one. Patient stated to call son to speak with him.  Both son and patient said that she did not need to go to ED, per the patient.  Awaiting return call from son so we can speak about patient SX.  If patient refused ED or Higher echelon of care, per PCP, Please ensure she monitors BP and FU. Again PCP advised patient to go to ED.

## 2016-12-12 DIAGNOSIS — Z7982 Long term (current) use of aspirin: Secondary | ICD-10-CM | POA: Diagnosis not present

## 2016-12-12 DIAGNOSIS — G4730 Sleep apnea, unspecified: Secondary | ICD-10-CM | POA: Diagnosis not present

## 2016-12-12 DIAGNOSIS — E119 Type 2 diabetes mellitus without complications: Secondary | ICD-10-CM | POA: Diagnosis not present

## 2016-12-12 DIAGNOSIS — G629 Polyneuropathy, unspecified: Secondary | ICD-10-CM | POA: Diagnosis not present

## 2016-12-12 DIAGNOSIS — E039 Hypothyroidism, unspecified: Secondary | ICD-10-CM | POA: Diagnosis not present

## 2016-12-12 DIAGNOSIS — E785 Hyperlipidemia, unspecified: Secondary | ICD-10-CM | POA: Diagnosis not present

## 2016-12-12 DIAGNOSIS — I10 Essential (primary) hypertension: Secondary | ICD-10-CM | POA: Diagnosis not present

## 2016-12-12 DIAGNOSIS — Z7984 Long term (current) use of oral hypoglycemic drugs: Secondary | ICD-10-CM | POA: Diagnosis not present

## 2016-12-12 DIAGNOSIS — I69351 Hemiplegia and hemiparesis following cerebral infarction affecting right dominant side: Secondary | ICD-10-CM | POA: Diagnosis not present

## 2016-12-12 DIAGNOSIS — Z9181 History of falling: Secondary | ICD-10-CM | POA: Diagnosis not present

## 2016-12-12 DIAGNOSIS — M545 Low back pain: Secondary | ICD-10-CM | POA: Diagnosis not present

## 2016-12-12 DIAGNOSIS — I69398 Other sequelae of cerebral infarction: Secondary | ICD-10-CM | POA: Diagnosis not present

## 2016-12-12 DIAGNOSIS — R55 Syncope and collapse: Secondary | ICD-10-CM | POA: Diagnosis not present

## 2016-12-12 NOTE — Telephone Encounter (Signed)
Unable to leave message due to it asking me for passcode.  Will try at a later time.

## 2016-12-12 NOTE — Telephone Encounter (Signed)
Please call and check on pt today  What is her BP?

## 2016-12-12 NOTE — Telephone Encounter (Signed)
Unable to leave message due to it asking me for passcode.  Will try at a later time.  

## 2016-12-13 NOTE — Telephone Encounter (Signed)
Spoke to patient, patient stated she has noway to check BP at the moment. But patient did state that she feels normal and has no pain or SOB.

## 2016-12-14 NOTE — Telephone Encounter (Signed)
Thank you brock.  Noted.  Closing this phone note.

## 2016-12-25 ENCOUNTER — Ambulatory Visit: Payer: Medicare Other | Admitting: Family

## 2016-12-30 ENCOUNTER — Telehealth: Payer: Self-pay | Admitting: Family

## 2016-12-30 ENCOUNTER — Ambulatory Visit: Payer: Medicare Other | Admitting: Family

## 2016-12-30 NOTE — Progress Notes (Deleted)
Subjective:    Patient ID: Alexandra English, female    DOB: 1927-02-06, 83 y.o.   MRN: [id number]  CC: Alexandra English is a 83 y.o. female who presents today for follow up.   HPI: 2 weeks ago, patient  had called and the TCM elevated blood pressure, dizziness, headache. At that time patient was advised to the emergency room, which she did not.      HISTORY:  Past Medical History:  Diagnosis Date  . Diabetes mellitus 04/2009   HgbA1c 7.1% 07/2009, Monofilament normal, Ortho pending  . Fracture    of rib  . Glaucoma    Dr. Thomasene Ripple  . Hypertension   . Sleep apnea    Per Pt. was supposed to start CPAP, however reluctant to try  . Stroke [facility])    Left frontal4/2010, Left parietal and right parietal 04/2011 , Started on Plavix and then changed to Coumadin by recommendation of Dr. Carlis Abbott  . Thyroid disease    hypothyroidism   Past Surgical History:  Procedure Laterality Date  . orthopedic surgeon     Dr. Latanya Maudlin  . TOTAL ABDOMINAL HYSTERECTOMY    . TOTAL SHOULDER REPLACEMENT Right   . WRIST SURGERY Left    Family History  Problem Relation Age of Onset  . CAD Mother   . CAD Father   . Diabetes Mellitus I Brother   . Diabetes Mellitus II Maternal Grandmother     Allergies: Beta adrenergic blockers; Cephalosporins; Lisinopril; Macrobid [nitrofurantoin macrocrystal]; Penicillins; and Toprol xl [metoprolol succinate] Current Outpatient Prescriptions on File Prior to Visit  Medication Sig Dispense Refill  . amLODipine (NORVASC) 5 MG tablet TAKE ONE TABLET BY MOUTH ONCE DAILY 90 tablet 3  . aspirin 81 MG tablet Take 81 mg by mouth daily.      Marland Kitchen atorvastatin (LIPITOR) 40 MG tablet TAKE ONE TABLET BY MOUTH ONCE DAILY 90 tablet 3  . Blood Glucose Monitoring Suppl (ONE TOUCH ULTRA SYSTEM KIT) W/DEVICE KIT 1 kit by Does not apply route once. 1 each 0  . cloNIDine (CATAPRES) 0.2 MG tablet TAKE ONE TABLET BY MOUTH AT BEDTIME 90 tablet 3  . glipiZIDE (GLUCOTROL) 5 MG tablet Take 1 tablet  (5 mg total) by mouth 2 (two) times daily before a meal. 60 tablet 2  . glucose blood (ONE TOUCH ULTRA TEST) test strip USE ONE STRIP TO CHECK GLUCOSE TWICE DAILY E11.8 100 each 2  . hydrOXYzine (ATARAX/VISTARIL) 25 MG tablet Take 1 tablet (25 mg total) by mouth daily. 90 tablet 3  . Incontinence Supply Disposable (INCONTINENCE BRIEF LARGE) MISC Use as directed. 54 each 11  . Lancets (ONETOUCH ULTRASOFT) lancets Use as instructed 100 each 3  . levofloxacin (LEVAQUIN) 250 MG tablet Take 1 tablet (250 mg total) by mouth daily. X 3 more days 3 tablet 0  . levothyroxine (SYNTHROID, LEVOTHROID) 75 MCG tablet TAKE ONE TABLET BY MOUTH ONCE DAILY 90 tablet 3  . metaxalone (SKELAXIN) 800 MG tablet Take 1 tablet (800 mg total) by mouth 3 (three) times daily. X 3 days and stop 10 tablet 0  . mirtazapine (REMERON) 30 MG tablet Take 1 tablet (30 mg total) by mouth at bedtime. 30 tablet 3  . omeprazole (PRILOSEC) 20 MG capsule TAKE ONE CAPSULE BY MOUTH ONCE DAILY 90 capsule 3  . sitaGLIPtin (JANUVIA) 25 MG tablet Take 1 tablet (25 mg total) by mouth daily. 30 tablet 5  . traMADol (ULTRAM) 50 MG tablet TAKE ONE TABLET BY MOUTH THREE TIMES  DAILY AS NEEDED 90 tablet 0   No current facility-administered medications on file prior to visit.     Social History  Substance Use Topics  . Smoking status: Never Smoker  . Smokeless tobacco: Never Used  . Alcohol use No    Review of Systems    Objective:    There were no vitals taken for this visit. BP Readings from Last 3 Encounters:  10/20/16 (!) 167/66  10/03/16 (!) 144/60  09/24/16 116/81   Wt Readings from Last 3 Encounters:  10/20/16 187 lb 4.8 oz (85 kg)  10/03/16 191 lb (86.6 kg)  09/24/16 208 lb 6.4 oz (94.5 kg)    Physical Exam     Assessment & Plan:   Problem List Items Addressed This Visit    None       I am having [name] maintain her aspirin, Incontinence Brief Large, ONE TOUCH ULTRA SYSTEM KIT, traMADol, atorvastatin,  cloNIDine, amLODipine, omeprazole, levothyroxine, sitaGLIPtin, onetouch ultrasoft, glucose blood, hydrOXYzine, mirtazapine, metaxalone, glipiZIDE, and levofloxacin.   No orders of the defined types were placed in this encounter.   Return precautions given.   Risks, benefits, and alternatives of the medications and treatment plan prescribed today were discussed, and patient expressed understanding.   Education regarding symptom management and diagnosis given to patient on AVS.  Continue to follow with [name], FNP for routine health maintenance.   [name] and I agreed with plan.   [name], FNP

## 2016-12-30 NOTE — Telephone Encounter (Signed)
FYI - Pt son called and stated that he is unable to bring pt to appt, he is having car trouble.

## 2016-12-30 NOTE — Telephone Encounter (Signed)
FYI

## 2017-01-08 ENCOUNTER — Other Ambulatory Visit: Payer: Self-pay

## 2017-01-08 DIAGNOSIS — E785 Hyperlipidemia, unspecified: Secondary | ICD-10-CM

## 2017-01-08 DIAGNOSIS — I10 Essential (primary) hypertension: Secondary | ICD-10-CM

## 2017-01-08 MED ORDER — AMLODIPINE BESYLATE 5 MG PO TABS: 5.0000 mg | ORAL_TABLET | Freq: Every day | ORAL | 3 refills | Status: DC

## 2017-01-08 NOTE — Telephone Encounter (Signed)
Last filled by Dr.Walker 01/15/16  90 3rf Last OV 09/24/16

## 2017-01-09 MED ORDER — AMLODIPINE BESYLATE 5 MG PO TABS: 5.0000 mg | ORAL_TABLET | Freq: Every day | ORAL | 3 refills | Status: DC

## 2017-01-09 MED ORDER — ATORVASTATIN CALCIUM 40 MG PO TABS: 40.0000 mg | ORAL_TABLET | Freq: Every day | ORAL | 3 refills | Status: DC

## 2017-01-09 NOTE — Addendum Note (Signed)
Addended by: Allegra GranaARNETT, Kimani Bedoya G on: 01/09/2017 08:25 AM   Modules accepted: Orders

## 2017-01-21 ENCOUNTER — Telehealth: Payer: Self-pay | Admitting: Family

## 2017-01-21 NOTE — Telephone Encounter (Signed)
Pt son called about pt needing a refill for sitaGLIPtin (JANUVIA) 25 MG tablet, cloNIDine (CATAPRES) 0.2 MG tablet, omeprazole (PRILOSEC) 20 MG capsule and levothyroxine (SYNTHROID, LEVOTHROID) 75 MCG tablet.   [facility]arm[facility] is [facility] [address] - , [place] - [id number]  Call pt @ [phone]. Thank you!

## 2017-01-22 MED ORDER — SITAGLIPTIN PHOSPHATE 25 MG PO TABS: 25.0000 mg | ORAL_TABLET | Freq: Every day | ORAL | 5 refills | Status: DC

## 2017-01-22 MED ORDER — OMEPRAZOLE 20 MG PO CPDR: 20.0000 mg | DELAYED_RELEASE_CAPSULE | Freq: Every day | ORAL | 3 refills | Status: DC

## 2017-01-22 MED ORDER — CLONIDINE HCL 0.2 MG PO TABS: 0.2000 mg | ORAL_TABLET | Freq: Every day | ORAL | 3 refills | Status: DC

## 2017-01-22 MED ORDER — LEVOTHYROXINE SODIUM 75 MCG PO TABS: 75.0000 ug | ORAL_TABLET | Freq: Every day | ORAL | 3 refills | Status: DC

## 2017-01-22 NOTE — Telephone Encounter (Signed)
Medication has been refilled.

## 2017-01-29 ENCOUNTER — Ambulatory Visit: Payer: Medicare Other | Admitting: Family

## 2017-02-03 ENCOUNTER — Ambulatory Visit (INDEPENDENT_AMBULATORY_CARE_PROVIDER_SITE_OTHER): Payer: Medicare Other | Admitting: Family

## 2017-02-03 ENCOUNTER — Encounter: Payer: Self-pay | Admitting: Family

## 2017-02-03 VITALS — BP 125/60 | HR 68 | Temp 97.8°F | Ht 65.0 in | Wt 211.6 lb

## 2017-02-03 DIAGNOSIS — R600 Localized edema: Secondary | ICD-10-CM | POA: Diagnosis not present

## 2017-02-03 DIAGNOSIS — I10 Essential (primary) hypertension: Secondary | ICD-10-CM | POA: Diagnosis not present

## 2017-02-03 DIAGNOSIS — R519 Headache, unspecified: Secondary | ICD-10-CM | POA: Insufficient documentation

## 2017-02-03 DIAGNOSIS — R51 Headache: Secondary | ICD-10-CM

## 2017-02-03 NOTE — Assessment & Plan Note (Addendum)
Controlled on current regimen. Will continue. Declined decreasing CCB to see if LE improves and starting another anti hypertensive agent. Rx for compression stockings and better shoes to see if helps LE swelling.

## 2017-02-03 NOTE — Assessment & Plan Note (Addendum)
Patient is not particularly bothered and declined medication changes ( see HTN). No SOB, orthopnea. Discussed use of salt and need to try compression stockings. Unable to find B/L LE US report from cardiology office however son reports negative. Declines repeat US today. Advised to incorporate walking in house to help blood flow, decrease LE edema since no regular exercise.

## 2017-02-03 NOTE — Assessment & Plan Note (Addendum)
New. Reassured by normal neurologic exam. Education provided on CVA symptoms in context of past h/o. Patient hasn't tried any medication for HA. Will use tyleonol for now and  If HA doesn't improve, patient and son understand to let me know for further evaluation.

## 2017-02-03 NOTE — Progress Notes (Signed)
Pre visit review using our clinic review tool, if applicable. No additional management support is needed unless otherwise documented below in the visit note. 

## 2017-02-03 NOTE — Progress Notes (Signed)
Subjective:    Patient ID: Alexandra English, female    DOB: 1927-08-30, 83 y.o.   MRN: [id number]  CC: Alexandra English is a 83 y.o. female who presents today for follow up.   HPI: Accompanied by son  HA for past couple of months, unchanged. Son unaware of HA> There 'all the time.' Frontal 'dull'. 'not worse HA of life.' Denies exertional chest pain or pressure, numbness or tingling radiating to left arm or jaw, palpitations, dizziness, frequent headaches, changes in vision, or shortness of breath. Hasn't tried any medication for pain. No triggers. Endorses mild clear nasal congestion. No ear pain, sore throat.   No longer dizzy. No falls   HTN- 144/72 at home. Denies exertional chest pain or pressure, numbness or tingling radiating to left arm or jaw, palpitations, dizziness, frequent headaches, changes in vision, or shortness of breath.   LE swelling. Improved today. Not wearing compression stockings. Improves with legs elevated.  No orthopnea, SOB. No blood clots per son when LE duplex done 09/2017 - which was done in cardiologist office. Doest 'walk much at all' per son.    Dr. Yvone Neu - 09/2017 suspected dependent edema. Echo 65%. advsied compression stockings HISTORY:  Past Medical History:  Diagnosis Date  . Diabetes mellitus 04/2009   HgbA1c 7.1% 07/2009, Monofilament normal, Ortho pending  . Fracture    of rib  . Glaucoma    Dr. Thomasene Ripple  . Hypertension   . Sleep apnea    Per Pt. was supposed to start CPAP, however reluctant to try  . Stroke [facility])    Left frontal4/2010, Left parietal and right parietal 04/2011 , Started on Plavix and then changed to Coumadin by recommendation of Dr. Carlis Abbott  . Thyroid disease    hypothyroidism   Past Surgical History:  Procedure Laterality Date  . orthopedic surgeon     Dr. Latanya Maudlin  . TOTAL ABDOMINAL HYSTERECTOMY    . TOTAL SHOULDER REPLACEMENT Right   . WRIST SURGERY Left    Family History  Problem Relation Age of Onset  . CAD Mother     . CAD Father   . Diabetes Mellitus I Brother   . Diabetes Mellitus II Maternal Grandmother     Allergies: Beta adrenergic blockers; Cephalosporins; Lisinopril; Macrobid [nitrofurantoin macrocrystal]; Penicillins; and Toprol xl [metoprolol succinate] Current Outpatient Prescriptions on File Prior to Visit  Medication Sig Dispense Refill  . amLODipine (NORVASC) 5 MG tablet Take 1 tablet (5 mg total) by mouth daily. 90 tablet 3  . aspirin 81 MG tablet Take 81 mg by mouth daily.      Marland Kitchen atorvastatin (LIPITOR) 40 MG tablet Take 1 tablet (40 mg total) by mouth daily. 90 tablet 3  . Blood Glucose Monitoring Suppl (ONE TOUCH ULTRA SYSTEM KIT) W/DEVICE KIT 1 kit by Does not apply route once. 1 each 0  . cloNIDine (CATAPRES) 0.2 MG tablet Take 1 tablet (0.2 mg total) by mouth at bedtime. 90 tablet 3  . glipiZIDE (GLUCOTROL) 5 MG tablet Take 1 tablet (5 mg total) by mouth 2 (two) times daily before a meal. 60 tablet 2  . glucose blood (ONE TOUCH ULTRA TEST) test strip USE ONE STRIP TO CHECK GLUCOSE TWICE DAILY E11.8 100 each 2  . hydrOXYzine (ATARAX/VISTARIL) 25 MG tablet Take 1 tablet (25 mg total) by mouth daily. 90 tablet 3  . Incontinence Supply Disposable (INCONTINENCE BRIEF LARGE) MISC Use as directed. 54 each 11  . Lancets (ONETOUCH ULTRASOFT) lancets Use as instructed 100  each 3  . levofloxacin (LEVAQUIN) 250 MG tablet Take 1 tablet (250 mg total) by mouth daily. X 3 more days 3 tablet 0  . levothyroxine (SYNTHROID, LEVOTHROID) 75 MCG tablet Take 1 tablet (75 mcg total) by mouth daily. 90 tablet 3  . metaxalone (SKELAXIN) 800 MG tablet Take 1 tablet (800 mg total) by mouth 3 (three) times daily. X 3 days and stop 10 tablet 0  . mirtazapine (REMERON) 30 MG tablet Take 1 tablet (30 mg total) by mouth at bedtime. 30 tablet 3  . omeprazole (PRILOSEC) 20 MG capsule Take 1 capsule (20 mg total) by mouth daily. 90 capsule 3  . sitaGLIPtin (JANUVIA) 25 MG tablet Take 1 tablet (25 mg total) by mouth  daily. 30 tablet 5  . traMADol (ULTRAM) 50 MG tablet TAKE ONE TABLET BY MOUTH THREE TIMES DAILY AS NEEDED 90 tablet 0   No current facility-administered medications on file prior to visit.     Social History  Substance Use Topics  . Smoking status: Never Smoker  . Smokeless tobacco: Never Used  . Alcohol use No    Review of Systems  Constitutional: Negative for chills and fever.  HENT: Positive for rhinorrhea.   Eyes: Negative for visual disturbance.  Respiratory: Negative for cough.   Cardiovascular: Positive for leg swelling. Negative for chest pain and palpitations.  Gastrointestinal: Negative for nausea and vomiting.  Neurological: Positive for headaches. Negative for syncope.      Objective:    BP 125/60   Pulse 68   Temp 97.8 F (36.6 C) (Oral)   Ht _0  (1.651 m)   Wt 211 lb 9.6 oz (96 kg) Comment: While sitting on cart  SpO2 98%   BMI 35.21 kg/m  BP Readings from Last 3 Encounters:  02/03/17 125/60  10/20/16 (!) 167/66  10/03/16 (!) 144/60   Wt Readings from Last 3 Encounters:  02/03/17 211 lb 9.6 oz (96 kg)  10/20/16 187 lb 4.8 oz (85 kg)  10/03/16 191 lb (86.6 kg)    Physical Exam  Constitutional: She appears well-developed and well-nourished.  HENT:  Mouth/Throat: Uvula is midline, oropharynx is clear and moist and mucous membranes are normal.  Eyes: Conjunctivae and EOM are normal. Pupils are equal, round, and reactive to light.  Fundus normal bilaterally.   Cardiovascular: Normal rate, regular rhythm, normal heart sounds and normal pulses.   Pulmonary/Chest: Effort normal and breath sounds normal. She has no wheezes. She has no rhonchi. She has no rales.  Neurological: She is alert. She has normal strength. No cranial nerve deficit or sensory deficit. She displays a negative Romberg sign.  Reflex Scores:      Bicep reflexes are 2+ on the right side and 2+ on the left side.      Patellar reflexes are 2+ on the right side and 2+ on the left  side. Grip equal and strong bilateral upper extremities. Sitting walker. 4/5 strength upper and lower extremities. Able to perform rapid alternating movement without difficulty.   Skin: Skin is warm and dry.  Psychiatric: She has a normal mood and affect. Her speech is normal and behavior is normal. Thought content normal.  Vitals reviewed.      Assessment & Plan:   Problem List Items Addressed This Visit      Cardiovascular and Mediastinum   Hypertension - Primary    Controlled on current regimen. Will continue. Declined decreasing CCB to see if LE improves and starting another anti hypertensive agent. Rx  for compression stockings and better shoes to see if helps LE swelling.       Relevant Orders   Ambulatory Referral for DME     Other   Edema    Patient is not particularly bothered and declined medication changes ( see HTN). No SOB, orthopnea. Discussed use of salt and need to try compression stockings. Unable to find B/L LE [place] report from cardiology office however son reports negative. Declines repeat US today. Advised to incorporate walking in house to help blood flow, decrease LE edema since no regular exercise.       Nonintractable headache    New. Reassured by normal neurologic exam. Education provided on CVA symptoms in context of past h/o. Patient hasn't tried any medication for HA. Will use tyleonol for now and  If HA doesn't improve, patient and son understand to let me know for further evaluation.           I am having Ms. Hosack maintain her aspirin, Incontinence Brief Large, ONE TOUCH ULTRA SYSTEM KIT, traMADol, onetouch ultrasoft, glucose blood, hydrOXYzine, mirtazapine, metaxalone, glipiZIDE, levofloxacin, atorvastatin, amLODipine, sitaGLIPtin, omeprazole, cloNIDine, and levothyroxine.   No orders of the defined types were placed in this encounter.   Return precautions given.   Risks, benefits, and alternatives of the medications and treatment plan prescribed  today were discussed, and patient expressed understanding.   Education regarding symptom management and diagnosis given to patient on AVS.  Continue to follow with [name], FNP for routine health maintenance.   [name] and I agreed with plan.   [name], FNP

## 2017-02-03 NOTE — Patient Instructions (Signed)
Stay on amlodipine for now  Compression hose  Let me know if HA is not better

## 2017-02-19 ENCOUNTER — Other Ambulatory Visit: Payer: Self-pay

## 2017-02-19 ENCOUNTER — Telehealth: Payer: Self-pay | Admitting: Family

## 2017-02-19 MED ORDER — GLIPIZIDE 5 MG PO TABS: 5.0000 mg | ORAL_TABLET | Freq: Two times a day (BID) | ORAL | 2 refills | Status: DC

## 2017-02-19 NOTE — Telephone Encounter (Signed)
Call pt-  Reviewing chart and unsure if all medications accurate.  Is she still taking the atarax? And when does she use it?   The medication can be very drowsy.  Also concerned with muscle relaxant and tramadol- how often does she use those?   PLEASE review all meds on list.

## 2017-02-20 NOTE — Telephone Encounter (Signed)
Left message for patient to return call back.  

## 2017-02-24 NOTE — Telephone Encounter (Signed)
unable to leave VM due to VM box being full.

## 2017-02-26 NOTE — Telephone Encounter (Signed)
Closing note due to patient not returning call. 

## 2017-03-16 NOTE — Telephone Encounter (Signed)
Please type up courtsey note to patient regarding my initial message and mail-   Let her know that we tried to reach her as we wanted to clarify her medications.  If she would be so kind to make a follow up with [place] so we can have a medication review that would be greatly appreciated.

## 2017-03-17 NOTE — Telephone Encounter (Signed)
Letter has been mailed.

## 2017-03-17 NOTE — Progress Notes (Signed)
Pre visit review using our clinic review tool, if applicable. No additional management support is needed unless otherwise documented below in the visit note. 

## 2017-04-07 ENCOUNTER — Other Ambulatory Visit: Payer: Self-pay | Admitting: Family

## 2017-04-07 DIAGNOSIS — F329 Major depressive disorder, single episode, unspecified: Secondary | ICD-10-CM

## 2017-04-07 DIAGNOSIS — F419 Anxiety disorder, unspecified: Principal | ICD-10-CM

## 2017-04-07 DIAGNOSIS — F32A Depression, unspecified: Secondary | ICD-10-CM

## 2017-04-07 DIAGNOSIS — I10 Essential (primary) hypertension: Secondary | ICD-10-CM

## 2017-04-08 NOTE — Telephone Encounter (Signed)
Pharmacy - [phone]

## 2017-04-08 NOTE — Telephone Encounter (Signed)
Pt called and is requesting this medication along with 2 others. Pt also needs amLODipine (NORVASC) 5 MG tablet, and hydrOXYzine (ATARAX/VISTARIL) 25 MG tablet. Pt states that she is out of 2 of her medications. Please advise, thank you!  Pharmacy 0

## 2017-04-09 MED ORDER — HYDROXYZINE HCL 25 MG PO TABS: 25.0000 mg | ORAL_TABLET | Freq: Every day | ORAL | 3 refills | Status: DC

## 2017-04-09 MED ORDER — AMLODIPINE BESYLATE 5 MG PO TABS: 5.0000 mg | ORAL_TABLET | Freq: Every day | ORAL | 3 refills | Status: DC

## 2017-04-09 NOTE — Telephone Encounter (Signed)
Ok to refill remeron? Last seen [id number] last ordered [id number]

## 2017-05-26 ENCOUNTER — Other Ambulatory Visit: Payer: Self-pay

## 2017-05-26 MED ORDER — SITAGLIPTIN PHOSPHATE 25 MG PO TABS: 25.0000 mg | ORAL_TABLET | Freq: Every day | ORAL | 1 refills | Status: DC

## 2017-05-28 ENCOUNTER — Emergency Department
Admission: EM | Admit: 2017-05-28 | Discharge: 2017-05-28 | Disposition: A | Discharge status: Skilled Nursing Facility | Payer: Medicare Other | Attending: Emergency Medicine | Admitting: Emergency Medicine

## 2017-05-28 ENCOUNTER — Emergency Department: Payer: Medicare Other

## 2017-05-28 ENCOUNTER — Encounter: Payer: Self-pay | Admitting: Emergency Medicine

## 2017-05-28 DIAGNOSIS — I69351 Hemiplegia and hemiparesis following cerebral infarction affecting right dominant side: Secondary | ICD-10-CM | POA: Insufficient documentation

## 2017-05-28 DIAGNOSIS — E039 Hypothyroidism, unspecified: Secondary | ICD-10-CM | POA: Insufficient documentation

## 2017-05-28 DIAGNOSIS — E119 Type 2 diabetes mellitus without complications: Secondary | ICD-10-CM | POA: Diagnosis not present

## 2017-05-28 DIAGNOSIS — R0600 Dyspnea, unspecified: Secondary | ICD-10-CM | POA: Diagnosis not present

## 2017-05-28 DIAGNOSIS — Z79899 Other long term (current) drug therapy: Secondary | ICD-10-CM | POA: Insufficient documentation

## 2017-05-28 DIAGNOSIS — I10 Essential (primary) hypertension: Secondary | ICD-10-CM | POA: Insufficient documentation

## 2017-05-28 DIAGNOSIS — Z7982 Long term (current) use of aspirin: Secondary | ICD-10-CM | POA: Insufficient documentation

## 2017-05-28 DIAGNOSIS — N390 Urinary tract infection, site not specified: Secondary | ICD-10-CM | POA: Insufficient documentation

## 2017-05-28 DIAGNOSIS — Z7401 Bed confinement status: Secondary | ICD-10-CM | POA: Diagnosis not present

## 2017-05-28 DIAGNOSIS — Z7984 Long term (current) use of oral hypoglycemic drugs: Secondary | ICD-10-CM | POA: Insufficient documentation

## 2017-05-28 DIAGNOSIS — R42 Dizziness and giddiness: Secondary | ICD-10-CM | POA: Diagnosis not present

## 2017-05-28 DIAGNOSIS — Z7901 Long term (current) use of anticoagulants: Secondary | ICD-10-CM | POA: Insufficient documentation

## 2017-05-28 LAB — URINALYSIS, COMPLETE (UACMP) WITH MICROSCOPIC
BILIRUBIN URINE: NEGATIVE
GLUCOSE, UA: NEGATIVE mg/dL
Hgb urine dipstick: NEGATIVE
KETONES UR: NEGATIVE mg/dL
NITRITE: NEGATIVE
PH: 6 (ref 5.0–8.0)
Protein, ur: NEGATIVE mg/dL
Specific Gravity, Urine: 1.003 — ABNORMAL LOW (ref 1.005–1.030)

## 2017-05-28 LAB — CBC
HCT: 39.4 % (ref 35.0–47.0)
Hemoglobin: 13.4 g/dL (ref 12.0–16.0)
MCH: 30.5 pg (ref 26.0–34.0)
MCHC: 34 g/dL (ref 32.0–36.0)
MCV: 89.5 fL (ref 80.0–100.0)
PLATELETS: 277 10*3/uL (ref 150–440)
RBC: 4.41 MIL/uL (ref 3.80–5.20)
RDW: 13.8 % (ref 11.5–14.5)
WBC: 10.1 10*3/uL (ref 3.6–11.0)

## 2017-05-28 LAB — TROPONIN I: Troponin I: <0.03 ng/mL (ref <0.03)

## 2017-05-28 LAB — BASIC METABOLIC PANEL
ANION GAP: 10 (ref 5–15)
BUN: 14 mg/dL (ref 6–20)
CALCIUM: 8.9 mg/dL (ref 8.9–10.3)
CO2: 23 mmol/L (ref 22–32)
Chloride: 103 mmol/L (ref 101–111)
Creatinine, Ser: 0.91 mg/dL (ref 0.44–1.00)
GFR, EST NON AFRICAN AMERICAN: 55 mL/min — AB (ref >60)
Glucose, Bld: 190 mg/dL — ABNORMAL HIGH (ref 65–99)
Potassium: 3.7 mmol/L (ref 3.5–5.1)
SODIUM: 136 mmol/L (ref 135–145)

## 2017-05-28 MED ORDER — LEVOFLOXACIN 250 MG PO TABS: 250.0000 mg | ORAL_TABLET | Freq: Every day | ORAL | 0 refills | Status: AC

## 2017-05-28 MED ORDER — LEVOFLOXACIN IN D5W 500 MG/100ML IV SOLN
500.0000 mg | Freq: Once | INTRAVENOUS | Status: AC
  Administered 2017-05-28: 500 mg via INTRAVENOUS
  Filled 2017-05-28: qty 100

## 2017-05-28 NOTE — ED Notes (Signed)
Report called to megan rn at [facility].  Pt alert.  Iv dc'ed

## 2017-05-28 NOTE — Discharge Instructions (Signed)
Please follow-up with your primary care physician in the next 1-2 days for recheck/reevaluation. Please take your antibiotics as prescribed for their entire course. Return to the emergency department for any worsening weakness, any fever, or any other symptom personally concerning to yourself.

## 2017-05-28 NOTE — ED Notes (Signed)
Pt/sheets wet.  Sheets changed. Diaper applied.  Pt cleaned

## 2017-05-28 NOTE — ED Notes (Signed)
Resumed care from valerie rn.  Pt alert.  Iv in place.   Denies pain.

## 2017-05-28 NOTE — ED Triage Notes (Addendum)
Pt woke up this AM feeling lightheadedness and shortness of breath.  Still feels a little short of breath.  Hx vertigo but describes as lightheaded not room spinning. Not sure if feels like her vertigo. Denies pain. From [facility]

## 2017-05-28 NOTE — ED Notes (Signed)
Patient transported to CT 

## 2017-05-28 NOTE — ED Notes (Signed)
Pt waiting on transport to [facility].

## 2017-05-28 NOTE — ED Provider Notes (Signed)
[facility] Emergency Department Provider Note  Time seen: 1:06 PM  I have reviewed the triage vital signs and the nursing notes.   HISTORY  Chief Complaint Shortness of Breath and Dizziness    HPI Alexandra BRENTS is a 83 y.o. female with a past medical history of diabetes, hypertension, CVA with right-sided deficits, who presents to the emergency department today for dizziness/lightheadedness. According to the patient she woke this morning feeling somewhat dizzy which she describes as a spinning sensation with some weakness and lightheadedness. Patient states a history of vertigo in the past but states she does not remember how that typically feels. Patient states right sided deficits are chronic including mild right facial droop. Denies any changes in weakness or numbness. Patient denies any chest pain or shortness breath, abdominal pain, nausea vomiting diarrhea. She does note some dysuria over the past week or 2.  Past Medical History:  Diagnosis Date  . Diabetes mellitus 04/2009   HgbA1c 7.1% 07/2009, Monofilament normal, Ortho pending  . Fracture    of rib  . Glaucoma    Dr. Thomasene Ripple  . Hypertension   . Sleep apnea    Per Pt. was supposed to start CPAP, however reluctant to try  . Stroke [facility])    Left frontal4/2010, Left parietal and right parietal 04/2011 , Started on Plavix and then changed to Coumadin by recommendation of Dr. Carlis Abbott  . Thyroid disease    hypothyroidism    Patient Active Problem List   Diagnosis Date Noted  . Nonintractable headache 02/03/2017  . Syncope 10/19/2016  . Dysuria 01/17/2016  . Candidal dermatitis 01/17/2016  . Back pain 06/27/2015  . Diabetic polyneuropathy associated with type 2 diabetes mellitus ([place]) 08/19/2014  . Edema 06/04/2013  . Pre-ulcerative corn or callous 01/13/2013  . Anxiety and depression 05/12/2012  . Arthralgia 01/09/2012  . Gait instability 12/02/2011  . Hypertension 09/11/2011  . Hyperlipidemia  09/11/2011  . History of stroke 09/11/2011  . DM (diabetes mellitus), type 2 with complications ([place]) 60/63/0160  . Hypothyroidism 09/11/2011    Past Surgical History:  Procedure Laterality Date  . orthopedic surgeon     Dr. Latanya Maudlin  . TOTAL ABDOMINAL HYSTERECTOMY    . TOTAL SHOULDER REPLACEMENT Right   . WRIST SURGERY Left     Prior to Admission medications   Medication Sig Start Date End Date Taking? Authorizing Provider  amLODipine (NORVASC) 5 MG tablet Take 1 tablet (5 mg total) by mouth daily. 04/09/17   Burnard Hawthorne, FNP  aspirin 81 MG tablet Take 81 mg by mouth daily.      [provider]  atorvastatin (LIPITOR) 40 MG tablet Take 1 tablet (40 mg total) by mouth daily. 01/09/17   Burnard Hawthorne, FNP  Blood Glucose Monitoring Suppl (ONE TOUCH ULTRA SYSTEM KIT) W/DEVICE KIT 1 kit by Does not apply route once. 08/19/14   Jackolyn Confer, MD  cloNIDine (CATAPRES) 0.2 MG tablet Take 1 tablet (0.2 mg total) by mouth at bedtime. 01/22/17   Burnard Hawthorne, FNP  glipiZIDE (GLUCOTROL) 5 MG tablet Take 1 tablet (5 mg total) by mouth 2 (two) times daily before a meal. 02/19/17   Burnard Hawthorne, FNP  glucose blood (ONE TOUCH ULTRA TEST) test strip USE ONE STRIP TO CHECK GLUCOSE TWICE DAILY E11.8 09/05/16   Thersa Salt G, DO  hydrOXYzine (ATARAX/VISTARIL) 25 MG tablet Take 1 tablet (25 mg total) by mouth daily. 04/09/17   Burnard Hawthorne, FNP  Incontinence Supply Disposable (INCONTINENCE BRIEF LARGE) MISC Use as directed. 02/18/14   Jackolyn Confer, MD  Lancets [facility] ULTRASOFT) lancets Use as instructed 09/03/16   Coral Spikes, DO  levofloxacin (LEVAQUIN) 250 MG tablet Take 1 tablet (250 mg total) by mouth daily. X 3 more days 10/20/16   Gladstone Lighter, MD  levothyroxine (SYNTHROID, LEVOTHROID) 75 MCG tablet Take 1 tablet (75 mcg total) by mouth daily. 01/22/17   Burnard Hawthorne, FNP  metaxalone (SKELAXIN) 800 MG tablet Take 1 tablet (800 mg total) by mouth 3  (three) times daily. X 3 days and stop 10/20/16   Gladstone Lighter, MD  mirtazapine (REMERON) 30 MG tablet TAKE ONE TABLET BY MOUTH AT BEDTIME 04/09/17   Burnard Hawthorne, FNP  omeprazole (PRILOSEC) 20 MG capsule Take 1 capsule (20 mg total) by mouth daily. 01/22/17   Burnard Hawthorne, FNP  sitaGLIPtin (JANUVIA) 25 MG tablet Take 1 tablet (25 mg total) by mouth daily. 05/26/17   Burnard Hawthorne, FNP  traMADol (ULTRAM) 50 MG tablet TAKE ONE TABLET BY MOUTH THREE TIMES DAILY AS NEEDED 12/06/15   Jackolyn Confer, MD    Allergies  Allergen Reactions  . Beta Adrenergic Blockers   . Cephalosporins   . Lisinopril   . Macrobid [Nitrofurantoin Macrocrystal]   . Penicillins   . Toprol Xl [Metoprolol Succinate]     Family History  Problem Relation Age of Onset  . CAD Mother   . CAD Father   . Diabetes Mellitus I Brother   . Diabetes Mellitus II Maternal Grandmother     Social History Social History  Substance Use Topics  . Smoking status: Never Smoker  . Smokeless tobacco: Never Used  . Alcohol use No    Review of Systems Constitutional: Negative for fever. Eyes: Negative for visual changes. ENT: Negative for congestion Cardiovascular: Negative for chest pain. Respiratory: Negative for shortness of breath. Gastrointestinal: Negative for abdominal pain, vomiting and diarrhea. Genitourinary: Intermittent dysuria over the past 1-2 weeks. Musculoskeletal: Negative for back pain. Skin: Negative for rash. Neurological: Negative for . Denies any new weakness or numbness. States chronic right-sided deficits. All other ROS negative  ____________________________________________   PHYSICAL EXAM:  VITAL SIGNS: ED Triage Vitals  Enc Vitals Group     BP 05/28/17 1241 (!) 151/67     Pulse Rate 05/28/17 1241 73     Resp 05/28/17 1241 20     Temp 05/28/17 1241 98.7 F (37.1 C)     Temp Source 05/28/17 1241 Oral     SpO2 05/28/17 1241 97 %     Weight 05/28/17 1236 190 lb (86.2  kg)     Height 05/28/17 1236 5' 3"  (1.6 m)     Head Circumference --      Peak Flow --      Pain Score --      Pain Loc --      Pain Edu? --      Excl. in [place]? --     Constitutional: Alert and oriented. Well appearing and in no distress. Eyes: Normal exam ENT   Head: Normocephalic and atraumatic.   Mouth/Throat: Mucous membranes are moist. Cardiovascular: Normal rate, regular rhythm.  Respiratory: Normal respiratory effort without tachypnea nor retractions. Breath sounds are clear Gastrointestinal: Soft and nontender. No distention.   Musculoskeletal: Nontender with normal range of motion in all extremities. Neurologic:  Normal speech and language. No gross focal neurologic deficits. Equal grip strengths. No pronator drift. Equal motor  in bilateral lower extremities. Patient doesn't mild right-sided facial droop. Skin:  Skin is warm, dry and intact.  Psychiatric: Mood and affect are normal. Speech and behavior are normal.   ____________________________________________    EKG  EKG reviewed and interpreted by myself appears to show a sinus rhythm at 72 bpm narrow QRS normal axis normal intervals and nonspecific ST changes without ST elevation.  ____________________________________________    RADIOLOGY   IMPRESSION: Mild diffuse cortical atrophy. Mild chronic ischemic white matter disease. No acute intracranial abnormality seen.  ____________________________________________   INITIAL IMPRESSION / ASSESSMENT AND PLAN / ED COURSE  Pertinent labs & imaging results that were available during my care of the patient were reviewed by me and considered in my medical decision making (see chart for details).  Patient presents to the emergency department for dizziness which she describes as a spinning and lightheaded sensation. Patient has a history of vertigo in the past per patient but does not recall what it feels like. We will check labs, urinalysis, CT scan of the head and  continue to closely monitor. Overall the patient appears very well, no distress.  CT head negative. Labs are significant urinary tract infection. I discussed this with the patient states she has had similar issues in the past with urinary tract infection. Patient is allergic to cephalosporins as well as penicillins. As well as Macrobid. We will treat with Levaquin, discharge on Levaquin, urine culture has been sent. Patient agreeable to this plan. Patient appears safe for discharge home.  ____________________________________________   FINAL CLINICAL IMPRESSION(S) / ED DIAGNOSES  Dizziness Urinary tract infection.   Harvest Dark, MD 05/28/17 1512

## 2017-05-30 LAB — URINE CULTURE

## 2017-06-05 ENCOUNTER — Ambulatory Visit (INDEPENDENT_AMBULATORY_CARE_PROVIDER_SITE_OTHER): Payer: Medicare Other | Admitting: Family

## 2017-06-05 ENCOUNTER — Encounter: Payer: Self-pay | Admitting: Family

## 2017-06-05 VITALS — BP 136/70 | HR 78 | Temp 98.2°F | Ht 63.0 in

## 2017-06-05 DIAGNOSIS — I639 Cerebral infarction, unspecified: Secondary | ICD-10-CM | POA: Insufficient documentation

## 2017-06-05 DIAGNOSIS — E118 Type 2 diabetes mellitus with unspecified complications: Secondary | ICD-10-CM

## 2017-06-05 DIAGNOSIS — R42 Dizziness and giddiness: Secondary | ICD-10-CM | POA: Diagnosis not present

## 2017-06-05 DIAGNOSIS — E039 Hypothyroidism, unspecified: Secondary | ICD-10-CM | POA: Diagnosis not present

## 2017-06-05 DIAGNOSIS — I10 Essential (primary) hypertension: Secondary | ICD-10-CM | POA: Diagnosis not present

## 2017-06-05 DIAGNOSIS — Z794 Long term (current) use of insulin: Secondary | ICD-10-CM | POA: Diagnosis not present

## 2017-06-05 DIAGNOSIS — R300 Dysuria: Secondary | ICD-10-CM | POA: Diagnosis not present

## 2017-06-05 NOTE — Progress Notes (Signed)
Pre visit review using our clinic review tool, if applicable. No additional management support is needed unless otherwise documented below in the visit note. 

## 2017-06-05 NOTE — Assessment & Plan Note (Signed)
Unsure if controlled. Pending med rec and a1c.

## 2017-06-05 NOTE — Patient Instructions (Addendum)
Labs, urine  Please bring ALL medications -- old and new - with you for next appointment.   Trial stop taking clonidine for a couple of days and come back on Monday for follow up. I want to see if this helps with dizziness, headache.   During this time, please check blood pressure ONCE per day, if higher than 140/90, please start back on clonidine.   Ensure plenty of water as this may also contribute to dizziness  Please also make routine eye exam appointment  See you next  week

## 2017-06-05 NOTE — Assessment & Plan Note (Signed)
Controlled today. Concern that clonidine may be causing both dizziness and ha. Reviewed ED course and recent cardiology notes. Reassured by normal neurologic exam. Patient had some confusion over what the was taking. Advised to hold the clonidine until next week to see if better. Med rec as well due to confusion, old medications ( concern she is taking tramadol for nerves). Close follow up.

## 2017-06-05 NOTE — Assessment & Plan Note (Signed)
On asa, statin therapy.

## 2017-06-05 NOTE — Assessment & Plan Note (Addendum)
At this time etiology is not fully clear. Suspect clonidine may be playing a role. Was able to do abbreviated orthostatics in office sitting, standing. Patient sits in a motorized wheelchair and unable to get on exam table. When she went from sitting to standing,no significant drop in systolic, diastolic blood pressures. See flow sheet. At this time, I think it's reasonable to hold clonidine as common side effects are headache, hypertension. We will do this for couple of days ( appt 6/26) with close vigilance as patient understands to monitor blood pressure at home. We'll bring her back early next week and decide whether or not we will get back to clonidine or start a new agent. Will also consider MRI. Closely follow.

## 2017-06-05 NOTE — Assessment & Plan Note (Signed)
Pending urine studies. 

## 2017-06-05 NOTE — Progress Notes (Signed)
Subjective:    Patient ID: Alexandra English, female    DOB: 12-02-1927, 83 y.o.   MRN: [id number]  CC: Alexandra English is a 83 y.o. female who presents today for follow up.   HPI: HTN- compliant with medication.   Notes still has burning with urinary and frequency since antibiotic. No fever. Completed levoquin.   Headache- dull, not worse since last visit. Tyelonol helps 'some'.   Had been in ED for dizziness 6/13 . describes getting dizzy when 'just getting of bed in the morning'. Uses walker at home and not motorized scooter.  No syncope or fall per patient.  Denies exertional chest pain or pressure, numbness or tingling radiating to left arm or jaw, vertigo, palpitations,frequent headaches, changes in vision, or shortness of breath during episode.   No weakness in legs. Feels steady with walker when walking. No chest pain when walking.   EKG showed SR. No ischemia. Paced CT head showed diffuse atrophy, no acute abnormality Diagnosed with UTI. Treated with levaquin.  Urine culture shows resistance to cipro- 6/13  US carotid 10/2016 minimal atherosclerotic disease  09/2016 Dr Yvone Neu- rec compression stockings for edema  Echo 10/2016 shows EF 60-65%    Lives alone in apartment at [facility]. Participates in meals.    Takes tramadol 'for nerve's on occasion.    H/o CVA.           HISTORY:  Past Medical History:  Diagnosis Date  . Diabetes mellitus 04/2009   HgbA1c 7.1% 07/2009, Monofilament normal, Ortho pending  . Fracture    of rib  . Glaucoma    Dr. Thomasene Ripple  . Hypertension   . Sleep apnea    Per Pt. was supposed to start CPAP, however reluctant to try  . Stroke [facility])    Left frontal4/2010, Left parietal and right parietal 04/2011 , Started on Plavix and then changed to Coumadin by recommendation of Dr. Carlis Abbott  . Thyroid disease    hypothyroidism   Past Surgical History:  Procedure Laterality Date  . orthopedic surgeon     Dr. Latanya Maudlin  .  TOTAL ABDOMINAL HYSTERECTOMY    . TOTAL SHOULDER REPLACEMENT Right   . WRIST SURGERY Left    Family History  Problem Relation Age of Onset  . CAD Mother   . CAD Father   . Diabetes Mellitus I Brother   . Diabetes Mellitus II Maternal Grandmother     Allergies: Beta adrenergic blockers; Cephalosporins; Lisinopril; Macrobid [nitrofurantoin macrocrystal]; Penicillins; and Toprol xl [metoprolol succinate] Current Outpatient Prescriptions on File Prior to Visit  Medication Sig Dispense Refill  . amLODipine (NORVASC) 5 MG tablet Take 1 tablet (5 mg total) by mouth daily. 90 tablet 3  . aspirin 81 MG tablet Take 81 mg by mouth daily.      Marland Kitchen atorvastatin (LIPITOR) 40 MG tablet Take 1 tablet (40 mg total) by mouth daily. 90 tablet 3  . Blood Glucose Monitoring Suppl (ONE TOUCH ULTRA SYSTEM KIT) W/DEVICE KIT 1 kit by Does not apply route once. 1 each 0  . cloNIDine (CATAPRES) 0.2 MG tablet Take 1 tablet (0.2 mg total) by mouth at bedtime. 90 tablet 3  . glipiZIDE (GLUCOTROL) 5 MG tablet Take 1 tablet (5 mg total) by mouth 2 (two) times daily before a meal. 60 tablet 2  . glucose blood (ONE TOUCH ULTRA TEST) test strip USE ONE STRIP TO CHECK GLUCOSE TWICE DAILY E11.8 100 each 2  . hydrOXYzine (ATARAX/VISTARIL) 25 MG tablet Take  1 tablet (25 mg total) by mouth daily. 90 tablet 3  . Incontinence Supply Disposable (INCONTINENCE BRIEF LARGE) MISC Use as directed. 54 each 11  . Lancets (ONETOUCH ULTRASOFT) lancets Use as instructed 100 each 3  . levothyroxine (SYNTHROID, LEVOTHROID) 75 MCG tablet Take 1 tablet (75 mcg total) by mouth daily. 90 tablet 3  . mirtazapine (REMERON) 30 MG tablet TAKE ONE TABLET BY MOUTH AT BEDTIME 30 tablet 3  . omeprazole (PRILOSEC) 20 MG capsule Take 1 capsule (20 mg total) by mouth daily. 90 capsule 3  . sitaGLIPtin (JANUVIA) 25 MG tablet Take 1 tablet (25 mg total) by mouth daily. 90 tablet 1  . traMADol (ULTRAM) 50 MG tablet TAKE ONE TABLET BY MOUTH THREE TIMES DAILY AS  NEEDED 90 tablet 0   No current facility-administered medications on file prior to visit.     Social History  Substance Use Topics  . Smoking status: Never Smoker  . Smokeless tobacco: Never Used  . Alcohol use No    Review of Systems  Constitutional: Negative for chills and fever.  Eyes: Negative for visual disturbance.  Respiratory: Negative for cough.   Cardiovascular: Negative for chest pain and palpitations.  Gastrointestinal: Negative for nausea and vomiting.  Genitourinary: Positive for dysuria and frequency.  Neurological: Positive for dizziness and headaches. Negative for syncope, weakness and light-headedness.      Objective:    BP 136/70   Pulse 78   Temp 98.2 F (36.8 C) (Oral)   Ht _0  (1.6 m)   SpO2 98%  BP Readings from Last 3 Encounters:  06/05/17 136/70  05/28/17 (!) 143/85  02/03/17 125/60   Wt Readings from Last 3 Encounters:  05/28/17 190 lb (86.2 kg)  02/03/17 211 lb 9.6 oz (96 kg)  10/20/16 187 lb 4.8 oz (85 kg)    Physical Exam  Constitutional: She appears well-developed and well-nourished.  HENT:  Mouth/Throat: Uvula is midline, oropharynx is clear and moist and mucous membranes are normal.  Eyes: Conjunctivae and EOM are normal. Pupils are equal, round, and reactive to light.  Fundus normal bilaterally.   Cardiovascular: Normal rate, regular rhythm, normal heart sounds and normal pulses.   Pulmonary/Chest: Effort normal and breath sounds normal. She has no wheezes. She has no rhonchi. She has no rales.  Neurological: She is alert. She has normal strength. No cranial nerve deficit or sensory deficit. She displays a negative Romberg sign.  Reflex Scores:      Bicep reflexes are 2+ on the right side and 2+ on the left side.      Patellar reflexes are 2+ on the right side and 2+ on the left side. Grip equal and strong bilateral upper extremities. Sitting in  Franklin chair.    Skin: Skin is warm and dry.  Psychiatric: She has a normal  mood and affect. Her speech is normal and behavior is normal. Thought content normal.  Vitals reviewed.      Assessment & Plan:   Problem List Items Addressed This Visit      Cardiovascular and Mediastinum   Hypertension    Controlled today. Concern that clonidine may be causing both dizziness and ha. Reviewed ED course and recent cardiology notes. Reassured by normal neurologic exam. Patient had some confusion over what the was taking. Advised to hold the clonidine until next week to see if better. Med rec as well due to confusion, old medications ( concern she is taking tramadol for nerves). Close follow up.  CVA (cerebral vascular accident) ([place])    On asa, statin therapy.         Endocrine   DM (diabetes mellitus), type 2 with complications ([place])    Unsure if controlled. Pending med rec and a1c.       Relevant Orders   Hemoglobin A1c   Hypothyroidism    Clinically asymptomatic. Pending tsh.      Relevant Orders   TSH     Other   Dysuria    Pending urine studies.       Relevant Orders   Urine Culture   Urinalysis, Routine w reflex microscopic   Dizziness - Primary    At this time etiology is not fully clear. Suspect clonidine may be playing a role. Was able to do abbreviated orthostatics in office sitting, standing. Patient sits in a motorized wheelchair and unable to get on exam table. When she went from sitting to standing,no significant drop in systolic, diastolic blood pressures. See flow sheet. At this time, I think it's reasonable to hold clonidine as common side effects are headache, hypertension. We will do this for couple of days ( appt 6/26) with close vigilance as patient understands to monitor blood pressure at home. We'll bring her back early next week and decide whether or not we will get back to clonidine or start a new agent. Will also consider MRI. Closely follow.           I have discontinued Ms. Underdown's metaxalone. I am also having her  maintain her aspirin, Incontinence Brief Large, ONE TOUCH ULTRA SYSTEM KIT, traMADol, onetouch ultrasoft, glucose blood, atorvastatin, omeprazole, cloNIDine, levothyroxine, glipiZIDE, mirtazapine, amLODipine, hydrOXYzine, and sitaGLIPtin.   No orders of the defined types were placed in this encounter.   Return precautions given.   Risks, benefits, and alternatives of the medications and treatment plan prescribed today were discussed, and patient expressed understanding.   Education regarding symptom management and diagnosis given to patient on AVS.  Continue to follow with Burnard Hawthorne, FNP for routine health maintenance.   [name] and I agreed with plan.   [name], FNP    I have reviewed the above information and agree with above.   Deborra Medina, MD

## 2017-06-05 NOTE — Assessment & Plan Note (Signed)
Clinically asymptomatic. Pending tsh 

## 2017-06-10 ENCOUNTER — Ambulatory Visit: Payer: Medicare Other | Admitting: Family

## 2017-06-23 ENCOUNTER — Ambulatory Visit: Payer: Medicare Other | Admitting: Family

## 2017-06-24 ENCOUNTER — Telehealth: Payer: Self-pay | Admitting: Family

## 2017-06-24 ENCOUNTER — Ambulatory Visit: Payer: Medicare Other | Admitting: Family

## 2017-06-24 NOTE — Telephone Encounter (Signed)
FYI

## 2017-06-24 NOTE — Telephone Encounter (Signed)
FYI - Pt cancelled appt, has no transportation.

## 2017-07-02 ENCOUNTER — Ambulatory Visit (INDEPENDENT_AMBULATORY_CARE_PROVIDER_SITE_OTHER): Payer: Medicare Other | Admitting: Family

## 2017-07-02 ENCOUNTER — Encounter: Payer: Self-pay | Admitting: Family

## 2017-07-02 VITALS — BP 136/60 | HR 83 | Temp 98.2°F | Ht 63.0 in | Wt 195.8 lb

## 2017-07-02 DIAGNOSIS — R42 Dizziness and giddiness: Secondary | ICD-10-CM | POA: Diagnosis not present

## 2017-07-02 DIAGNOSIS — Z7189 Other specified counseling: Secondary | ICD-10-CM | POA: Diagnosis not present

## 2017-07-02 DIAGNOSIS — I10 Essential (primary) hypertension: Secondary | ICD-10-CM

## 2017-07-02 NOTE — Addendum Note (Signed)
Addended by: Warden FillersWRIGHT, LATOYA S on: 07/02/2017 11:13 AM   Modules accepted: Orders

## 2017-07-02 NOTE — Progress Notes (Signed)
Subjective:    Patient ID: Alexandra English, female    DOB: 11/25/1927, 83 y.o.   MRN: [id number]  CC: Alexandra English is a 83 y.o. female who presents today for follow up.   HPI: Med rec- not on tramadol. Unsure why she takes the Atarax. Patient denies anxiety. She also does not complain of any epigastric burning, burping or any other symptoms of GERD.  Feeling well today.   Walking with walker at home. No chest pain.   Dizziness- No longer feeling dizzy. advised to hold clonidine at last OV- patient didn't hold clonidine as didn't have way to check blood pressure.    HTN- doesn't check BP at home. Compliant with medications. Denies exertional chest pain or pressure, numbness or tingling radiating to left arm or jaw, palpitations, dizziness, frequent headaches, changes in vision, or shortness of breath.      UTI- treated  CT head 05/2017 normal HISTORY:  Past Medical History:  Diagnosis Date  . Diabetes mellitus 04/2009   HgbA1c 7.1% 07/2009, Monofilament normal, Ortho pending  . Fracture    of rib  . Glaucoma    Dr. Thomasene Ripple  . Hypertension   . Sleep apnea    Per Pt. was supposed to start CPAP, however reluctant to try  . Stroke [facility])    Left frontal4/2010, Left parietal and right parietal 04/2011 , Started on Plavix and then changed to Coumadin by recommendation of Dr. Carlis Abbott  . Thyroid disease    hypothyroidism   Past Surgical History:  Procedure Laterality Date  . orthopedic surgeon     Dr. Latanya Maudlin  . TOTAL ABDOMINAL HYSTERECTOMY    . TOTAL SHOULDER REPLACEMENT Right   . WRIST SURGERY Left    Family History  Problem Relation Age of Onset  . CAD Mother   . CAD Father   . Diabetes Mellitus I Brother   . Diabetes Mellitus II Maternal Grandmother     Allergies: Beta adrenergic blockers; Cephalosporins; Lisinopril; Macrobid [nitrofurantoin macrocrystal]; Penicillins; and Toprol xl [metoprolol succinate] Current Outpatient Prescriptions on File Prior to Visit    Medication Sig Dispense Refill  . amLODipine (NORVASC) 5 MG tablet Take 1 tablet (5 mg total) by mouth daily. 90 tablet 3  . aspirin 81 MG tablet Take 81 mg by mouth daily.      Marland Kitchen atorvastatin (LIPITOR) 40 MG tablet Take 1 tablet (40 mg total) by mouth daily. 90 tablet 3  . Blood Glucose Monitoring Suppl (ONE TOUCH ULTRA SYSTEM KIT) W/DEVICE KIT 1 kit by Does not apply route once. 1 each 0  . cloNIDine (CATAPRES) 0.2 MG tablet Take 1 tablet (0.2 mg total) by mouth at bedtime. 90 tablet 3  . glipiZIDE (GLUCOTROL) 5 MG tablet Take 1 tablet (5 mg total) by mouth 2 (two) times daily before a meal. 60 tablet 2  . glucose blood (ONE TOUCH ULTRA TEST) test strip USE ONE STRIP TO CHECK GLUCOSE TWICE DAILY E11.8 100 each 2  . Lancets (ONETOUCH ULTRASOFT) lancets Use as instructed 100 each 3  . levothyroxine (SYNTHROID, LEVOTHROID) 75 MCG tablet Take 1 tablet (75 mcg total) by mouth daily. 90 tablet 3  . mirtazapine (REMERON) 30 MG tablet TAKE ONE TABLET BY MOUTH AT BEDTIME 30 tablet 3  . sitaGLIPtin (JANUVIA) 25 MG tablet Take 1 tablet (25 mg total) by mouth daily. 90 tablet 1   No current facility-administered medications on file prior to visit.     Social History  Substance Use Topics  .  Smoking status: Never Smoker  . Smokeless tobacco: Never Used  . Alcohol use No    Review of Systems  Constitutional: Negative for chills and fever.  Eyes: Negative for visual disturbance.  Respiratory: Negative for cough.   Cardiovascular: Negative for chest pain and palpitations.  Gastrointestinal: Negative for nausea and vomiting.  Neurological: Negative for dizziness, light-headedness and headaches.      Objective:    BP 136/60   Pulse 83   Temp 98.2 F (36.8 C) (Oral)   Ht _0  (1.6 m)   Wt 195 lb 12.8 oz (88.8 kg)   SpO2 97%   BMI 34.68 kg/m  BP Readings from Last 3 Encounters:  07/02/17 136/60  06/05/17 136/70  05/28/17 (!) 143/85   Wt Readings from Last 3 Encounters:  07/02/17 195  lb 12.8 oz (88.8 kg)  05/28/17 190 lb (86.2 kg)  02/03/17 211 lb 9.6 oz (96 kg)    Physical Exam  Constitutional: She appears well-developed and well-nourished.  Eyes: Conjunctivae are normal.  Cardiovascular: Normal rate, regular rhythm, normal heart sounds and normal pulses.   Pulmonary/Chest: Effort normal and breath sounds normal. She has no wheezes. She has no rhonchi. She has no rales.  Neurological: She is alert.  Skin: Skin is warm and dry.  Psychiatric: She has a normal mood and affect. Her speech is normal and behavior is normal. Thought content normal.  Vitals reviewed.      Assessment & Plan:   Problem List Items Addressed This Visit      Cardiovascular and Mediastinum   Hypertension    Well-controlled. Will continue current regimen.        Other   Dizziness    Resolved at this time. No cardiac symptoms. Patient appears very well today. We'll continue to monitor and I strongly encouraged patient and emphasized the importance of letting me know if she is any more episodes of dizziness.      Encounter for medication review and counseling - Primary    Patient was kind enough to bring in all her  medications. We extensively reviewed her medications, dosages and how to take medications. Notable corrections were made including patient was taking glipizide and Synthroid. Printout list for patient. Advised her to bring medications every visit.      Relevant Orders   Hemoglobin A1c   Comprehensive metabolic panel   TSH       I have discontinued Ms. Samara's Incontinence Brief Large, traMADol, omeprazole, and hydrOXYzine. I am also having her maintain her aspirin, ONE TOUCH ULTRA SYSTEM KIT, onetouch ultrasoft, glucose blood, atorvastatin, cloNIDine, levothyroxine, glipiZIDE, mirtazapine, amLODipine, and sitaGLIPtin.   No orders of the defined types were placed in this encounter.   Return precautions given.   Risks, benefits, and alternatives of the medications  and treatment plan prescribed today were discussed, and patient expressed understanding.   Education regarding symptom management and diagnosis given to patient on AVS.  Continue to follow with Burnard Hawthorne, FNP for routine health maintenance.   [name] and I agreed with plan.   [name], FNP  Total of 25 minutes spent with patient, greater than 50% of which was spent in discussion of medications, what used for, dosage. Discontinued and threw away old medications

## 2017-07-02 NOTE — Patient Instructions (Addendum)
Labs today  Follow up 3 months  As discussed  It is most importantly take glipizide with a meal. Since you skipbreakfast every day, please take  glipizide with lunch and again with dinner. If you do not take this medication with food, you may have very low blood sugars.  Blood pressure  Medications - Amlodipine Clonidine  Diabetes Medications- Glipizide Januvia  Cholesterol- atorvastatin  Depression and trouble sleeping- Mirtazapine  Thyroid- Levothyroxine

## 2017-07-02 NOTE — Assessment & Plan Note (Signed)
Resolved at this time. No cardiac symptoms. Patient appears very well today. We'll continue to monitor and I strongly encouraged patient and emphasized the importance of letting me know if she is any more episodes of dizziness.

## 2017-07-02 NOTE — Assessment & Plan Note (Signed)
Patient was kind enough to bring in all her  medications. We extensively reviewed her medications, dosages and how to take medications. Notable corrections were made including patient was taking glipizide and Synthroid. Printout list for patient. Advised her to bring medications every visit.

## 2017-07-02 NOTE — Progress Notes (Signed)
Pre visit review using our clinic review tool, if applicable. No additional management support is needed unless otherwise documented below in the visit note. 

## 2017-07-02 NOTE — Assessment & Plan Note (Signed)
Well controlled. Will continue current regimen.

## 2017-07-04 ENCOUNTER — Telehealth: Payer: Self-pay | Admitting: Family

## 2017-07-04 DIAGNOSIS — K219 Gastro-esophageal reflux disease without esophagitis: Secondary | ICD-10-CM

## 2017-07-04 MED ORDER — RANITIDINE HCL 150 MG PO TABS: 150.0000 mg | ORAL_TABLET | Freq: Two times a day (BID) | ORAL | 1 refills | Status: DC

## 2017-07-04 NOTE — Telephone Encounter (Signed)
Pt calle and wanted to know if M. Arnett can prescribe the Prilosec for her again because she is having issues with indigestion. Please advise, thank you! Call pt @  [phone]

## 2017-07-04 NOTE — Telephone Encounter (Signed)
Actually I would would prefer her to try otc zantac 150mg  BID before meals.  This medication has safer long term use  If no improvement, we can do prilosec again.

## 2017-07-04 NOTE — Telephone Encounter (Signed)
Medication change was ok per patient [facility] [address]

## 2017-07-04 NOTE — Telephone Encounter (Signed)
Ok to give patient?

## 2017-07-08 ENCOUNTER — Other Ambulatory Visit: Payer: Medicare Other

## 2017-07-08 MED ORDER — OMEPRAZOLE 20 MG PO CPDR: 20.0000 mg | DELAYED_RELEASE_CAPSULE | Freq: Every day | ORAL | 1 refills | Status: DC

## 2017-07-08 NOTE — Telephone Encounter (Signed)
Please advise 

## 2017-07-08 NOTE — Addendum Note (Signed)
Addended by: Allegra GranaARNETT, MARGARET G on: 07/08/2017 03:54 PM   Modules accepted: Orders

## 2017-07-08 NOTE — Addendum Note (Signed)
Addended by: Penne LashWIGGINS, Sharnette Kitamura N on: 07/08/2017 09:58 AM   Modules accepted: Orders

## 2017-07-08 NOTE — Telephone Encounter (Signed)
Left message for patient to return call back.  

## 2017-07-08 NOTE — Telephone Encounter (Signed)
I sent in prilosec.   Please ensure she has f/u appt with me so we can discuss how its going  Please triage and ensure NO  exertional chest pain or pressure, numbness or tingling radiating to left arm or jaw, palpitations, dizziness, frequent headaches, changes in vision, or shortness of breath.

## 2017-07-08 NOTE — Telephone Encounter (Signed)
Pt called about the zantac not working pt states it feels like her insides are on fire. Please advise?  Call pt @ [phone]. Thank you!

## 2017-07-08 NOTE — Addendum Note (Signed)
Addended by: Penne LashWIGGINS, Jakiya Bookbinder N on: 07/08/2017 10:32 AM   Modules accepted: Orders

## 2017-07-09 ENCOUNTER — Telehealth: Payer: Self-pay | Admitting: Family

## 2017-07-09 NOTE — Telephone Encounter (Signed)
Pt called stating that she has indigestion for 2-3 days. She says the the medications are not helping. Please advise, thank you!  Call pt @ [phone]

## 2017-07-09 NOTE — Telephone Encounter (Signed)
Left message for patient to return call back.  

## 2017-07-09 NOTE — Telephone Encounter (Signed)
Please advise, was just started on Prilosec.  thanks

## 2017-07-10 ENCOUNTER — Ambulatory Visit (INDEPENDENT_AMBULATORY_CARE_PROVIDER_SITE_OTHER): Payer: Medicare Other | Admitting: Family

## 2017-07-10 ENCOUNTER — Encounter: Payer: Self-pay | Admitting: Family

## 2017-07-10 VITALS — BP 142/64 | HR 75 | Temp 98.1°F | Resp 12 | Ht 63.0 in | Wt 190.0 lb

## 2017-07-10 DIAGNOSIS — K219 Gastro-esophageal reflux disease without esophagitis: Secondary | ICD-10-CM | POA: Diagnosis not present

## 2017-07-10 NOTE — Patient Instructions (Addendum)
Start back on prilosec.   [facility] at [facility] for [facility].   Let me know if your symptoms are not controlled on medication.

## 2017-07-10 NOTE — Assessment & Plan Note (Signed)
Controlled on Prilosec. I have refilled this prescription for patient. However, patient has never had EGD and  decided to go ahead and sent her to GI for consult and possible EGD. I want to ensure PPI appropriate for long-term use. We'll follow.

## 2017-07-10 NOTE — Progress Notes (Signed)
Subjective:    Patient ID: Alexandra English, female    DOB: [birth year], 83 y.o.   MRN: [id number]  CC: Alexandra English is a 83 y.o. female who presents today for an acute visit.    HPI: Epigastric burning came back as soon as stopped Prilosec 2 weeks ago, worsening. At night burning 'after supper' is the worse. Endorses bad taste in mouth, more burping.  'wants to go back prilosec.' No certain foods made worse.  Denies exertional chest pain or pressure, numbness or tingling radiating to left arm or jaw, palpitations, dizziness, frequent headaches, changes in vision, or shortness of breath.     Stopped omeprazole 2 weeks ago   HISTORY:  Past Medical History:  Diagnosis Date  . Diabetes mellitus 04/2009   HgbA1c 7.1% 07/2009, Monofilament normal, Ortho pending  . Fracture    of rib  . Glaucoma    Dr. Thomasene Ripple  . Hypertension   . Sleep apnea    Per Pt. was supposed to start CPAP, however reluctant to try  . Stroke [facility])    Left frontal4/2010, Left parietal and right parietal 04/2011 , Started on Plavix and then changed to Coumadin by recommendation of Dr. Carlis Abbott  . Thyroid disease    hypothyroidism   Past Surgical History:  Procedure Laterality Date  . orthopedic surgeon     Dr. Latanya Maudlin  . TOTAL ABDOMINAL HYSTERECTOMY    . TOTAL SHOULDER REPLACEMENT Right   . WRIST SURGERY Left    Family History  Problem Relation Age of Onset  . CAD Mother   . CAD Father   . Diabetes Mellitus I Brother   . Diabetes Mellitus II Maternal Grandmother     Allergies: Beta adrenergic blockers; Cephalosporins; Lisinopril; Macrobid [nitrofurantoin macrocrystal]; Penicillins; and Toprol xl [metoprolol succinate] Current Outpatient Prescriptions on File Prior to Visit  Medication Sig Dispense Refill  . amLODipine (NORVASC) 5 MG tablet Take 1 tablet (5 mg total) by mouth daily. 90 tablet 3  . aspirin 81 MG tablet Take 81 mg by mouth daily.      Marland Kitchen atorvastatin (LIPITOR) 40 MG tablet Take 1 tablet  (40 mg total) by mouth daily. 90 tablet 3  . Blood Glucose Monitoring Suppl (ONE TOUCH ULTRA SYSTEM KIT) W/DEVICE KIT 1 kit by Does not apply route once. 1 each 0  . cloNIDine (CATAPRES) 0.2 MG tablet Take 1 tablet (0.2 mg total) by mouth at bedtime. 90 tablet 3  . glipiZIDE (GLUCOTROL) 5 MG tablet Take 1 tablet (5 mg total) by mouth 2 (two) times daily before a meal. 60 tablet 2  . glucose blood (ONE TOUCH ULTRA TEST) test strip USE ONE STRIP TO CHECK GLUCOSE TWICE DAILY E11.8 100 each 2  . Lancets (ONETOUCH ULTRASOFT) lancets Use as instructed 100 each 3  . levothyroxine (SYNTHROID, LEVOTHROID) 75 MCG tablet Take 1 tablet (75 mcg total) by mouth daily. 90 tablet 3  . mirtazapine (REMERON) 30 MG tablet TAKE ONE TABLET BY MOUTH AT BEDTIME 30 tablet 3  . omeprazole (PRILOSEC) 20 MG capsule Take 1 capsule (20 mg total) by mouth daily. 30 capsule 1  . ranitidine (ZANTAC) 150 MG tablet Take 1 tablet (150 mg total) by mouth 2 (two) times daily. 90 tablet 1  . sitaGLIPtin (JANUVIA) 25 MG tablet Take 1 tablet (25 mg total) by mouth daily. 90 tablet 1   No current facility-administered medications on file prior to visit.     Social History  Substance Use Topics  .  Smoking status: Never Smoker  . Smokeless tobacco: Never Used  . Alcohol use No    Review of Systems  Constitutional: Negative for chills and fever.  Respiratory: Negative for cough and shortness of breath.   Cardiovascular: Negative for chest pain and palpitations.  Gastrointestinal: Negative for nausea and vomiting.      Objective:    BP (!) 142/64 (BP Location: Left Arm, Patient Position: Sitting, Cuff Size: Normal)   Pulse 75   Temp 98.1 F (36.7 C) (Oral)   Resp 12   Ht 5' 3"  (1.6 m)   Wt 190 lb (86.2 kg)   SpO2 98%   BMI 33.66 kg/m    Physical Exam  Constitutional: She appears well-developed and well-nourished.  Eyes: Conjunctivae are normal.  Cardiovascular: Normal rate, regular rhythm, normal heart sounds and  normal pulses.   Pulmonary/Chest: Effort normal and breath sounds normal. She has no wheezes. She has no rhonchi. She has no rales.  Neurological: She is alert.  Skin: Skin is warm and dry.  Psychiatric: She has a normal mood and affect. Her speech is normal and behavior is normal. Thought content normal.  Vitals reviewed.      Assessment & Plan:   Problem List Items Addressed This Visit      Digestive   GERD (gastroesophageal reflux disease) - Primary    Controlled on Prilosec. I have refilled this prescription for patient. However, patient has never had EGD and  decided to go ahead and sent her to GI for consult and possible EGD. I want to ensure PPI appropriate for long-term use. We'll follow.      Relevant Orders   Ambulatory referral to Gastroenterology        I am having Ms. Henne maintain her aspirin, ONE TOUCH ULTRA SYSTEM KIT, onetouch ultrasoft, glucose blood, atorvastatin, cloNIDine, levothyroxine, glipiZIDE, mirtazapine, amLODipine, sitaGLIPtin, ranitidine, and omeprazole.   No orders of the defined types were placed in this encounter.   Return precautions given.   Risks, benefits, and alternatives of the medications and treatment plan prescribed today were discussed, and patient expressed understanding.   Education regarding symptom management and diagnosis given to patient on AVS.  Continue to follow with Burnard Hawthorne, FNP for routine health maintenance.   [name] and I agreed with plan.   [name], FNP

## 2017-07-10 NOTE — Telephone Encounter (Signed)
Spoke with the patient, the prilosec isn't working, she denies CP, left arm pain and sob.  Scheduled for appt with you. thanks

## 2017-07-10 NOTE — Telephone Encounter (Signed)
Please call pt - She needs OV. Please call and Schedule. Please ensure no cp, sob, left arm numbness/ pain.

## 2017-07-10 NOTE — Telephone Encounter (Signed)
attempted to reach patient, phone keeps ringing, no message option.  Will try again, thanks

## 2017-07-11 ENCOUNTER — Encounter: Payer: Self-pay | Admitting: Gastroenterology

## 2017-07-11 ENCOUNTER — Other Ambulatory Visit (INDEPENDENT_AMBULATORY_CARE_PROVIDER_SITE_OTHER): Payer: Medicare Other

## 2017-07-11 DIAGNOSIS — R300 Dysuria: Secondary | ICD-10-CM

## 2017-07-11 DIAGNOSIS — Z794 Long term (current) use of insulin: Secondary | ICD-10-CM | POA: Diagnosis not present

## 2017-07-11 DIAGNOSIS — E039 Hypothyroidism, unspecified: Secondary | ICD-10-CM | POA: Diagnosis not present

## 2017-07-11 DIAGNOSIS — E118 Type 2 diabetes mellitus with unspecified complications: Secondary | ICD-10-CM | POA: Diagnosis not present

## 2017-07-11 DIAGNOSIS — Z7189 Other specified counseling: Secondary | ICD-10-CM

## 2017-07-11 LAB — URINALYSIS, ROUTINE W REFLEX MICROSCOPIC
BILIRUBIN URINE: NEGATIVE
Ketones, ur: NEGATIVE
NITRITE: NEGATIVE
PH: 5.5 (ref 5.0–8.0)
Specific Gravity, Urine: <=1.005 — AB (ref 1.000–1.030)
Total Protein, Urine: NEGATIVE
Urine Glucose: NEGATIVE
Urobilinogen, UA: 0.2 (ref 0.0–1.0)

## 2017-07-11 LAB — COMPREHENSIVE METABOLIC PANEL
ALT: 12 U/L (ref 0–35)
AST: 12 U/L (ref 0–37)
Albumin: 3.7 g/dL (ref 3.5–5.2)
Alkaline Phosphatase: 97 U/L (ref 39–117)
BUN: 13 mg/dL (ref 6–23)
CALCIUM: 9.1 mg/dL (ref 8.4–10.5)
CHLORIDE: 102 meq/L (ref 96–112)
CO2: 21 meq/L (ref 19–32)
Creatinine, Ser: 0.94 mg/dL (ref 0.40–1.20)
GFR: 59.77 mL/min — AB (ref >60.00)
Glucose, Bld: 226 mg/dL — ABNORMAL HIGH (ref 70–99)
Potassium: 3.8 mEq/L (ref 3.5–5.1)
Sodium: 135 mEq/L (ref 135–145)
Total Bilirubin: 0.3 mg/dL (ref 0.2–1.2)
Total Protein: 7.2 g/dL (ref 6.0–8.3)

## 2017-07-11 LAB — HEMOGLOBIN A1C: HEMOGLOBIN A1C: 8.8 % — AB (ref 4.6–6.5)

## 2017-07-11 LAB — TSH: TSH: 1.69 u[IU]/mL (ref 0.35–4.50)

## 2017-07-11 NOTE — Progress Notes (Signed)
Attempted to reach patient, unable to leave a voicemail, thanks

## 2017-07-11 NOTE — Progress Notes (Signed)
.  h 

## 2017-07-14 ENCOUNTER — Other Ambulatory Visit: Payer: Self-pay | Admitting: Family

## 2017-07-14 DIAGNOSIS — N3000 Acute cystitis without hematuria: Secondary | ICD-10-CM

## 2017-07-14 LAB — URINE CULTURE

## 2017-07-14 MED ORDER — FOSFOMYCIN TROMETHAMINE 3 G PO PACK: 3.0000 g | PACK | Freq: Once | ORAL | 0 refills | Status: AC

## 2017-07-14 NOTE — Progress Notes (Signed)
crtcl 57. macrobid contraindicated. Many resistant strands.  Will give monurol

## 2017-07-16 ENCOUNTER — Telehealth: Payer: Self-pay | Admitting: Family

## 2017-07-16 NOTE — Telephone Encounter (Signed)
Pt called and asked that we send that medication in for her even though the insurance company will not pay for it. Pt asked to call her once completed. Please advise, thank you!

## 2017-07-16 NOTE — Telephone Encounter (Signed)
Spoke with patient and her son to let them know that the prescription had already been sent in.

## 2017-07-18 ENCOUNTER — Other Ambulatory Visit: Payer: Self-pay | Admitting: Family Medicine

## 2017-07-18 ENCOUNTER — Telehealth: Payer: Self-pay

## 2017-07-18 MED ORDER — FOSFOMYCIN TROMETHAMINE 3 G PO PACK: 3.0000 g | PACK | Freq: Once | ORAL | 0 refills | Status: DC

## 2017-07-18 MED ORDER — CEFDINIR 300 MG PO CAPS: 300.0000 mg | ORAL_CAPSULE | Freq: Two times a day (BID) | ORAL | 0 refills | Status: DC

## 2017-07-18 NOTE — Telephone Encounter (Signed)
Patient called and is asking for new medication due to the pharmacy unable to fill until next week.  The pharmacy does not have the medication to give out.  Is there anything else she can have? Please advise.

## 2017-07-18 NOTE — Telephone Encounter (Signed)
Rx sent 

## 2017-07-18 NOTE — Telephone Encounter (Signed)
Patient doesnt recall the reactions besides PCN (anaphylactic shock).

## 2017-07-18 NOTE — Telephone Encounter (Signed)
Alexandra PlowmanMargaret English forgot to send in the medication monurol for ecoli in patients urine please see lab result note.

## 2017-07-18 NOTE — Telephone Encounter (Signed)
There is nothing else to give her given her allergies. What kind of allergy does she have to penicillin, cephalosporins, Macrobid?

## 2017-07-18 NOTE — Telephone Encounter (Signed)
Drug not covered due to cost  For # 1 or # 3 $267.43.  Drug not on formulary.

## 2017-07-20 NOTE — Telephone Encounter (Signed)
Alexandra English,  Can we see if there are any coupons available for monurol?  We really don't have another option based on urine culture and pateint's extensive drug allergies  Please also call patient and see how she is doing.

## 2017-07-21 NOTE — Telephone Encounter (Signed)
Thanks to you both!  Mal AmabileBrock, would you call pt and see how she is doing ? Hope symptoms improving ..Marland Kitchen

## 2017-07-21 NOTE — Telephone Encounter (Signed)
Dr. Adriana Simasook has placed RX for Omnicef on Friday..  The pharmacy does not have monurol at the moment.  FYI

## 2017-07-21 NOTE — Telephone Encounter (Signed)
Spoke with pt and she stated that she is still having symptoms of incontinence, frequency. She reported no fever and no confusion. Pt stated that she only wanted to see you so I had to schedule her for Thursday because there was nothing available for tomorrow and she couldn't come at the time available on Thursday. Pt was advised that if her symptoms got any worse or she developed a fever or confusion then she would need to go to the emergency room. Pt gave a verbal understanding.

## 2017-07-21 NOTE — Telephone Encounter (Signed)
Shanda BumpsJessica,   Please call

## 2017-07-21 NOTE — Telephone Encounter (Signed)
Shanda BumpsJessica,   Would you see below?

## 2017-07-21 NOTE — Telephone Encounter (Signed)
Call pt - I don't see that she has a urologist  Need more info- what exactly are symptoms?  Please make an appt for her here so we can reassess and repeat urine

## 2017-07-21 NOTE — Telephone Encounter (Signed)
Patient stated she cant tell if SX are better or not.  She would like provider to call urologist to talk to them so she can be evaluated. Please advise.

## 2017-07-21 NOTE — Telephone Encounter (Signed)
If patient were to use Good Rx app on a device, can get discount, [facility]  She would not use here insurance.

## 2017-07-22 NOTE — Telephone Encounter (Signed)
noted 

## 2017-07-24 ENCOUNTER — Ambulatory Visit (INDEPENDENT_AMBULATORY_CARE_PROVIDER_SITE_OTHER): Payer: Medicare Other | Admitting: Family

## 2017-07-24 ENCOUNTER — Encounter: Payer: Self-pay | Admitting: Family

## 2017-07-24 VITALS — BP 108/60 | HR 66 | Temp 98.8°F | Ht 63.0 in

## 2017-07-24 DIAGNOSIS — R300 Dysuria: Secondary | ICD-10-CM | POA: Diagnosis not present

## 2017-07-24 NOTE — Assessment & Plan Note (Signed)
Resolved at this time. No further evaluation at this time.

## 2017-07-24 NOTE — Patient Instructions (Signed)
You look wonderful and so happy you are feeling well  Let me know if you need anything!

## 2017-07-24 NOTE — Progress Notes (Signed)
Pre visit review using our clinic review tool, if applicable. No additional management support is needed unless otherwise documented below in the visit note. 

## 2017-07-24 NOTE — Progress Notes (Signed)
Subjective:    Patient ID: Alexandra English, female    DOB: 12/08/1927, 83 y.o.   MRN: [id number]  CC: Alexandra English is a 83 y.o. female who presents today for follow up.   HPI: Follow-up UTI.  Culture grew Escherichia coli and patient started on Omnicef.   States she is feeling better. No complaints. Denies fever, flank pain, suprapubic pressure and dysuria, urinary frequency, rash, trouble breathing.  Discussed antibiotic allergies and patient does not recall specific reactions to those medications. Does not recall rash or anaphylaxis.    HISTORY:  Past Medical History:  Diagnosis Date  . Diabetes mellitus 04/2009   HgbA1c 7.1% 07/2009, Monofilament normal, Ortho pending  . Fracture    of rib  . Glaucoma    Dr. Thomasene Ripple  . Hypertension   . Sleep apnea    Per Pt. was supposed to start CPAP, however reluctant to try  . Stroke [facility])    Left frontal4/2010, Left parietal and right parietal 04/2011 , Started on Plavix and then changed to Coumadin by recommendation of Dr. Carlis Abbott  . Thyroid disease    hypothyroidism   Past Surgical History:  Procedure Laterality Date  . orthopedic surgeon     Dr. Latanya Maudlin  . TOTAL ABDOMINAL HYSTERECTOMY    . TOTAL SHOULDER REPLACEMENT Right   . WRIST SURGERY Left    Family History  Problem Relation Age of Onset  . CAD Mother   . CAD Father   . Diabetes Mellitus I Brother   . Diabetes Mellitus II Maternal Grandmother     Allergies: Beta adrenergic blockers; Cephalosporins; Lisinopril; Macrobid [nitrofurantoin macrocrystal]; Penicillins; and Toprol xl [metoprolol succinate] Current Outpatient Prescriptions on File Prior to Visit  Medication Sig Dispense Refill  . amLODipine (NORVASC) 5 MG tablet Take 1 tablet (5 mg total) by mouth daily. 90 tablet 3  . aspirin 81 MG tablet Take 81 mg by mouth daily.      Marland Kitchen atorvastatin (LIPITOR) 40 MG tablet Take 1 tablet (40 mg total) by mouth daily. 90 tablet 3  . Blood Glucose Monitoring Suppl (ONE  TOUCH ULTRA SYSTEM KIT) W/DEVICE KIT 1 kit by Does not apply route once. 1 each 0  . cefdinir (OMNICEF) 300 MG capsule Take 1 capsule (300 mg total) by mouth 2 (two) times daily. 10 capsule 0  . cloNIDine (CATAPRES) 0.2 MG tablet Take 1 tablet (0.2 mg total) by mouth at bedtime. 90 tablet 3  . glipiZIDE (GLUCOTROL) 5 MG tablet Take 1 tablet (5 mg total) by mouth 2 (two) times daily before a meal. 60 tablet 2  . glucose blood (ONE TOUCH ULTRA TEST) test strip USE ONE STRIP TO CHECK GLUCOSE TWICE DAILY E11.8 100 each 2  . Lancets (ONETOUCH ULTRASOFT) lancets Use as instructed 100 each 3  . levothyroxine (SYNTHROID, LEVOTHROID) 75 MCG tablet Take 1 tablet (75 mcg total) by mouth daily. 90 tablet 3  . mirtazapine (REMERON) 30 MG tablet TAKE ONE TABLET BY MOUTH AT BEDTIME 30 tablet 3  . omeprazole (PRILOSEC) 20 MG capsule Take 1 capsule (20 mg total) by mouth daily. 30 capsule 1  . ranitidine (ZANTAC) 150 MG tablet Take 1 tablet (150 mg total) by mouth 2 (two) times daily. 90 tablet 1  . sitaGLIPtin (JANUVIA) 25 MG tablet Take 1 tablet (25 mg total) by mouth daily. 90 tablet 1   No current facility-administered medications on file prior to visit.     Social History  Substance Use Topics  .  Smoking status: Never Smoker  . Smokeless tobacco: Never Used  . Alcohol use No    Review of Systems  Constitutional: Negative for chills and fever.  Respiratory: Negative for cough.   Cardiovascular: Negative for chest pain and palpitations.  Gastrointestinal: Negative for nausea and vomiting.  Genitourinary: Negative for dysuria and urgency.      Objective:    BP 108/60   Pulse 66   Temp 98.8 F (37.1 C) (Oral)   Ht 5' 3"  (1.6 m)   SpO2 97%  BP Readings from Last 3 Encounters:  07/24/17 108/60  07/10/17 (!) 142/64  07/02/17 136/60   Wt Readings from Last 3 Encounters:  07/10/17 190 lb (86.2 kg)  07/02/17 195 lb 12.8 oz (88.8 kg)  05/28/17 190 lb (86.2 kg)    Physical Exam    Constitutional: She appears well-developed and well-nourished.  Eyes: Conjunctivae are normal.  Cardiovascular: Normal rate, regular rhythm, normal heart sounds and normal pulses.   Pulmonary/Chest: Effort normal and breath sounds normal. She has no wheezes. She has no rhonchi. She has no rales.  Neurological: She is alert.  Skin: Skin is warm and dry.  Psychiatric: She has a normal mood and affect. Her speech is normal and behavior is normal. Thought content normal.  Vitals reviewed.      Assessment & Plan:   Problem List Items Addressed This Visit      Other   Dysuria - Primary    Resolved at this time. No further evaluation at this time.           I am having Alexandra English maintain her aspirin, ONE TOUCH ULTRA SYSTEM KIT, onetouch ultrasoft, glucose blood, atorvastatin, cloNIDine, levothyroxine, glipiZIDE, mirtazapine, amLODipine, sitaGLIPtin, ranitidine, omeprazole, and cefdinir.   No orders of the defined types were placed in this encounter.   Return precautions given.   Risks, benefits, and alternatives of the medications and treatment plan prescribed today were discussed, and patient expressed understanding.   Education regarding symptom management and diagnosis given to patient on AVS.  Continue to follow with Burnard Hawthorne, FNP for routine health maintenance.   [name] and I agreed with plan.   [name], FNP

## 2017-08-07 ENCOUNTER — Other Ambulatory Visit: Payer: Self-pay | Admitting: Family

## 2017-08-14 ENCOUNTER — Other Ambulatory Visit: Payer: Self-pay

## 2017-08-14 ENCOUNTER — Observation Stay
Admission: EM | Admit: 2017-08-14 | Discharge: 2017-08-18 | Disposition: A | Discharge status: Skilled Nursing Facility | Payer: Medicare Other | Attending: Internal Medicine | Admitting: Internal Medicine

## 2017-08-14 ENCOUNTER — Encounter: Payer: Self-pay | Admitting: Emergency Medicine

## 2017-08-14 ENCOUNTER — Emergency Department: Payer: Medicare Other

## 2017-08-14 DIAGNOSIS — L309 Dermatitis, unspecified: Secondary | ICD-10-CM | POA: Diagnosis not present

## 2017-08-14 DIAGNOSIS — N3000 Acute cystitis without hematuria: Secondary | ICD-10-CM | POA: Diagnosis not present

## 2017-08-14 DIAGNOSIS — E039 Hypothyroidism, unspecified: Secondary | ICD-10-CM | POA: Diagnosis not present

## 2017-08-14 DIAGNOSIS — S7002XA Contusion of left hip, initial encounter: Secondary | ICD-10-CM | POA: Diagnosis not present

## 2017-08-14 DIAGNOSIS — R531 Weakness: Secondary | ICD-10-CM | POA: Diagnosis not present

## 2017-08-14 DIAGNOSIS — I10 Essential (primary) hypertension: Secondary | ICD-10-CM | POA: Insufficient documentation

## 2017-08-14 DIAGNOSIS — Z8673 Personal history of transient ischemic attack (TIA), and cerebral infarction without residual deficits: Secondary | ICD-10-CM | POA: Diagnosis not present

## 2017-08-14 DIAGNOSIS — Z993 Dependence on wheelchair: Secondary | ICD-10-CM | POA: Insufficient documentation

## 2017-08-14 DIAGNOSIS — M79674 Pain in right toe(s): Secondary | ICD-10-CM

## 2017-08-14 DIAGNOSIS — H409 Unspecified glaucoma: Secondary | ICD-10-CM | POA: Insufficient documentation

## 2017-08-14 DIAGNOSIS — W19XXXA Unspecified fall, initial encounter: Secondary | ICD-10-CM | POA: Diagnosis present

## 2017-08-14 DIAGNOSIS — W07XXXA Fall from chair, initial encounter: Secondary | ICD-10-CM | POA: Diagnosis not present

## 2017-08-14 DIAGNOSIS — Z880 Allergy status to penicillin: Secondary | ICD-10-CM | POA: Insufficient documentation

## 2017-08-14 DIAGNOSIS — E785 Hyperlipidemia, unspecified: Secondary | ICD-10-CM | POA: Insufficient documentation

## 2017-08-14 DIAGNOSIS — S59902A Unspecified injury of left elbow, initial encounter: Secondary | ICD-10-CM | POA: Diagnosis not present

## 2017-08-14 DIAGNOSIS — E119 Type 2 diabetes mellitus without complications: Secondary | ICD-10-CM | POA: Diagnosis not present

## 2017-08-14 DIAGNOSIS — Z7982 Long term (current) use of aspirin: Secondary | ICD-10-CM | POA: Insufficient documentation

## 2017-08-14 DIAGNOSIS — Y9289 Other specified places as the place of occurrence of the external cause: Secondary | ICD-10-CM | POA: Insufficient documentation

## 2017-08-14 DIAGNOSIS — S8002XA Contusion of left knee, initial encounter: Secondary | ICD-10-CM

## 2017-08-14 DIAGNOSIS — S39012A Strain of muscle, fascia and tendon of lower back, initial encounter: Secondary | ICD-10-CM | POA: Diagnosis not present

## 2017-08-14 DIAGNOSIS — M25562 Pain in left knee: Secondary | ICD-10-CM | POA: Diagnosis not present

## 2017-08-14 DIAGNOSIS — G4730 Sleep apnea, unspecified: Secondary | ICD-10-CM | POA: Insufficient documentation

## 2017-08-14 DIAGNOSIS — M25522 Pain in left elbow: Secondary | ICD-10-CM | POA: Diagnosis not present

## 2017-08-14 DIAGNOSIS — R5381 Other malaise: Secondary | ICD-10-CM | POA: Diagnosis not present

## 2017-08-14 DIAGNOSIS — Z7901 Long term (current) use of anticoagulants: Secondary | ICD-10-CM | POA: Insufficient documentation

## 2017-08-14 DIAGNOSIS — S5002XA Contusion of left elbow, initial encounter: Secondary | ICD-10-CM

## 2017-08-14 DIAGNOSIS — Z888 Allergy status to other drugs, medicaments and biological substances status: Secondary | ICD-10-CM | POA: Insufficient documentation

## 2017-08-14 DIAGNOSIS — M25552 Pain in left hip: Secondary | ICD-10-CM | POA: Diagnosis not present

## 2017-08-14 DIAGNOSIS — S8992XA Unspecified injury of left lower leg, initial encounter: Secondary | ICD-10-CM | POA: Diagnosis not present

## 2017-08-14 DIAGNOSIS — M545 Low back pain: Secondary | ICD-10-CM | POA: Diagnosis not present

## 2017-08-14 DIAGNOSIS — S79912A Unspecified injury of left hip, initial encounter: Secondary | ICD-10-CM | POA: Diagnosis not present

## 2017-08-14 LAB — COMPREHENSIVE METABOLIC PANEL
ALK PHOS: 112 U/L (ref 38–126)
ALT: 16 U/L (ref 14–54)
AST: 24 U/L (ref 15–41)
Albumin: 3.7 g/dL (ref 3.5–5.0)
Anion gap: 11 (ref 5–15)
BILIRUBIN TOTAL: 0.6 mg/dL (ref 0.3–1.2)
BUN: 11 mg/dL (ref 6–20)
CALCIUM: 9 mg/dL (ref 8.9–10.3)
CHLORIDE: 102 mmol/L (ref 101–111)
CO2: 22 mmol/L (ref 22–32)
CREATININE: 0.9 mg/dL (ref 0.44–1.00)
GFR, EST NON AFRICAN AMERICAN: 56 mL/min — AB (ref >60)
Glucose, Bld: 219 mg/dL — ABNORMAL HIGH (ref 65–99)
Potassium: 3.9 mmol/L (ref 3.5–5.1)
Sodium: 135 mmol/L (ref 135–145)
TOTAL PROTEIN: 7.5 g/dL (ref 6.5–8.1)

## 2017-08-14 LAB — CBC WITH DIFFERENTIAL/PLATELET
BASOS PCT: 1 %
Basophils Absolute: 0.2 10*3/uL — ABNORMAL HIGH (ref 0–0.1)
EOS ABS: 0 10*3/uL (ref 0–0.7)
EOS PCT: 0 %
HCT: 38 % (ref 35.0–47.0)
Hemoglobin: 13 g/dL (ref 12.0–16.0)
LYMPHS ABS: 1.1 10*3/uL (ref 1.0–3.6)
Lymphocytes Relative: 7 %
MCH: 30.1 pg (ref 26.0–34.0)
MCHC: 34.2 g/dL (ref 32.0–36.0)
MCV: 87.9 fL (ref 80.0–100.0)
Monocytes Absolute: 0.9 10*3/uL (ref 0.2–0.9)
Monocytes Relative: 6 %
Neutro Abs: 13 10*3/uL — ABNORMAL HIGH (ref 1.4–6.5)
Neutrophils Relative %: 86 %
PLATELETS: 255 10*3/uL (ref 150–440)
RBC: 4.32 MIL/uL (ref 3.80–5.20)
RDW: 13.6 % (ref 11.5–14.5)
WBC: 15.1 10*3/uL — AB (ref 3.6–11.0)

## 2017-08-14 LAB — URINALYSIS, COMPLETE (UACMP) WITH MICROSCOPIC
Bilirubin Urine: NEGATIVE
GLUCOSE, UA: 150 mg/dL — AB
HGB URINE DIPSTICK: NEGATIVE
Ketones, ur: NEGATIVE mg/dL
Nitrite: NEGATIVE
PH: 6 (ref 5.0–8.0)
Protein, ur: 30 mg/dL — AB
RBC / HPF: NONE SEEN RBC/hpf (ref 0–5)
SPECIFIC GRAVITY, URINE: 1.008 (ref 1.005–1.030)

## 2017-08-14 MED ORDER — LINAGLIPTIN 5 MG PO TABS
5.0000 mg | ORAL_TABLET | Freq: Every day | ORAL | Status: DC
  Administered 2017-08-15 – 2017-08-18 (×4): 5 mg via ORAL
  Filled 2017-08-14 (×5): qty 1

## 2017-08-14 MED ORDER — ONETOUCH ULTRA SYSTEM W/DEVICE KIT: 1.0000 | PACK | Freq: Once | Status: DC

## 2017-08-14 MED ORDER — ONDANSETRON 8 MG PO TBDP
8.0000 mg | ORAL_TABLET | Freq: Once | ORAL | Status: AC
  Administered 2017-08-14: 8 mg via ORAL
  Filled 2017-08-14: qty 1

## 2017-08-14 MED ORDER — ACETAMINOPHEN 325 MG PO TABS
650.0000 mg | ORAL_TABLET | Freq: Four times a day (QID) | ORAL | Status: DC | PRN
  Administered 2017-08-14 – 2017-08-17 (×7): 650 mg via ORAL
  Filled 2017-08-14 (×7): qty 2

## 2017-08-14 MED ORDER — MIRTAZAPINE 15 MG PO TABS
30.0000 mg | ORAL_TABLET | Freq: Every day | ORAL | Status: DC
  Administered 2017-08-14 – 2017-08-17 (×4): 30 mg via ORAL
  Filled 2017-08-14 (×4): qty 2

## 2017-08-14 MED ORDER — LEVOTHYROXINE SODIUM 50 MCG PO TABS
75.0000 ug | ORAL_TABLET | Freq: Every day | ORAL | Status: DC
  Administered 2017-08-15 – 2017-08-18 (×4): 75 ug via ORAL
  Filled 2017-08-14 (×4): qty 2

## 2017-08-14 MED ORDER — LEVOFLOXACIN IN D5W 750 MG/150ML IV SOLN: 750.0000 mg | INTRAVENOUS | Status: DC

## 2017-08-14 MED ORDER — POLYETHYLENE GLYCOL 3350 17 G PO PACK
17.0000 g | PACK | Freq: Every day | ORAL | Status: DC | PRN
  Administered 2017-08-17: 17 g via ORAL
  Filled 2017-08-14: qty 1

## 2017-08-14 MED ORDER — FAMOTIDINE 20 MG PO TABS
20.0000 mg | ORAL_TABLET | Freq: Every day | ORAL | Status: DC
  Administered 2017-08-14 – 2017-08-17 (×4): 20 mg via ORAL
  Filled 2017-08-14 (×4): qty 1

## 2017-08-14 MED ORDER — LEVOFLOXACIN IN D5W 750 MG/150ML IV SOLN
750.0000 mg | Freq: Once | INTRAVENOUS | Status: AC
  Administered 2017-08-14: 750 mg via INTRAVENOUS
  Filled 2017-08-14: qty 150

## 2017-08-14 MED ORDER — ACETAMINOPHEN 650 MG RE SUPP: 650.0000 mg | Freq: Four times a day (QID) | RECTAL | Status: DC | PRN

## 2017-08-14 MED ORDER — GLIPIZIDE 5 MG PO TABS
5.0000 mg | ORAL_TABLET | Freq: Every day | ORAL | Status: DC
  Administered 2017-08-15: 5 mg via ORAL
  Filled 2017-08-14: qty 1

## 2017-08-14 MED ORDER — ENOXAPARIN SODIUM 40 MG/0.4ML ~~LOC~~ SOLN
40.0000 mg | SUBCUTANEOUS | Status: DC
  Administered 2017-08-15 – 2017-08-17 (×3): 40 mg via SUBCUTANEOUS
  Filled 2017-08-14 (×3): qty 0.4

## 2017-08-14 MED ORDER — SODIUM CHLORIDE 0.9 % IV BOLUS (SEPSIS)
1000.0000 mL | Freq: Once | INTRAVENOUS | Status: AC
  Administered 2017-08-14: 1000 mL via INTRAVENOUS

## 2017-08-14 MED ORDER — ATORVASTATIN CALCIUM 20 MG PO TABS
40.0000 mg | ORAL_TABLET | Freq: Every day | ORAL | Status: DC
  Administered 2017-08-15 – 2017-08-18 (×4): 40 mg via ORAL
  Filled 2017-08-14 (×4): qty 2

## 2017-08-14 MED ORDER — SODIUM CHLORIDE 0.9 % IV SOLN
INTRAVENOUS | Status: DC
  Administered 2017-08-14: 22:00:00 via INTRAVENOUS

## 2017-08-14 MED ORDER — DEXTROSE 5 % IV SOLN
1.0000 g | Freq: Once | INTRAVENOUS | Status: AC
  Administered 2017-08-14: 1 g via INTRAVENOUS
  Filled 2017-08-14: qty 10

## 2017-08-14 MED ORDER — ASPIRIN EC 81 MG PO TBEC
81.0000 mg | DELAYED_RELEASE_TABLET | Freq: Every day | ORAL | Status: DC
  Administered 2017-08-15 – 2017-08-18 (×4): 81 mg via ORAL
  Filled 2017-08-14 (×8): qty 1

## 2017-08-14 MED ORDER — AMLODIPINE BESYLATE 5 MG PO TABS
5.0000 mg | ORAL_TABLET | Freq: Every day | ORAL | Status: DC
  Administered 2017-08-15 – 2017-08-18 (×4): 5 mg via ORAL
  Filled 2017-08-14 (×4): qty 1

## 2017-08-14 MED ORDER — CLONIDINE HCL 0.1 MG PO TABS
0.2000 mg | ORAL_TABLET | Freq: Every day | ORAL | Status: DC
  Administered 2017-08-14 – 2017-08-17 (×4): 0.2 mg via ORAL
  Filled 2017-08-14 (×4): qty 2

## 2017-08-14 MED ORDER — PANTOPRAZOLE SODIUM 40 MG PO TBEC
40.0000 mg | DELAYED_RELEASE_TABLET | Freq: Every day | ORAL | Status: DC
  Administered 2017-08-15 – 2017-08-18 (×4): 40 mg via ORAL
  Filled 2017-08-14 (×5): qty 1

## 2017-08-14 NOTE — ED Triage Notes (Signed)
[facility] Pt reports fell today when trying to get out the chair to get her telephone. Pt reports was in her electric wheelchair and it quit working so she tried to get out to get the phone. Pt states could not reach her walker and fell onto her left arm and knee. Pt reports low back, left knee and left arm pain. Denies LOC.

## 2017-08-14 NOTE — Progress Notes (Signed)
Pharmacy Antibiotic Note  [name] is a 83 y.o. female admitted on 08/14/2017 with UTI.  Pharmacy has been consulted for Levaquin dosing.  Plan: Levaquin 750 mg iv q 48 hours.   Weight: 190 lb (86.2 kg)  Temp (24hrs), Avg:98.1 F (36.7 C), Min:97.5 F (36.4 C), Max:98.6 F (37 C)   Recent Labs Lab 08/14/17 1629  WBC 15.1*  CREATININE 0.90    Estimated Creatinine Clearance: 45.8 mL/min (by C-G formula based on SCr of 0.9 mg/dL).    Allergies  Allergen Reactions  . Beta Adrenergic Blockers   . Cephalosporins     Taken omnicef without a problem. MGA 07/2017  . Lisinopril   . Macrobid [Nitrofurantoin Macrocrystal]   . Toprol Xl [Metoprolol Succinate]   . Penicillins Swelling and Rash    Has patient had a PCN reaction causing immediate rash, facial/tongue/throat swelling, SOB or lightheadedness with hypotension: Yes Has patient had a PCN reaction causing severe rash involving mucus membranes or skin necrosis: Unknown Has patient had a PCN reaction that required hospitalization: No Has patient had a PCN reaction occurring within the last 10 years: No If all of the above answers are "NO", then may proceed with Cephalosporin use.    Antimicrobials this admission: CTX 8/30 x 1 Levaquin 8/30 >>   Dose adjustments this admission:   Microbiology results: 8/30 UCx: sent   Thank you for allowing pharmacy to be a part of this patient's care.  Luisa HartChristy, Tevin Shillingford D 08/14/2017 8:08 PM

## 2017-08-14 NOTE — ED Notes (Signed)
Pt belongings bagged, tagged, and placed in pt bed for transport.

## 2017-08-14 NOTE — ED Notes (Signed)
See triage note  States she fell trying to get out of chair  Landed on left knee  also hit left elbow    No deformity noted   Also having some pain to lower back

## 2017-08-14 NOTE — Progress Notes (Signed)
Chaplain received a PG to visit with patient. CH visited with patient provided ministry of presence, words of comfort and words of encouragement related to life transitions ie son moving.     08/14/17 2030  Clinical Encounter Type  Visited With Patient  Visit Type Initial;Spiritual support  Referral From Nurse  Consult/Referral To Chaplain  Spiritual Encounters  Spiritual Needs Emotional;Other (Comment)

## 2017-08-14 NOTE — ED Provider Notes (Signed)
[facility] Emergency Department Provider Note  ____________________________________________  Time seen: Approximately 3:15 PM  I have reviewed the triage vital signs and the nursing notes.   HISTORY  Chief Complaint Fall    HPI ELLISSA English is a 83 y.o. female who presents emergency department complaining of left elbow pain, lower back pain, left hip pain, left knee pain status post fall today. Patient reports that she requires a walker or motorized wheelchair for evaluation. Today she was in her motorized wheelchair when the power gave out. The posterior ring and she tried to stand up to answer the phone. Patient reports that she fell landing on her left side. She did not hit her head or lose consciousness. She denies any headache, visual changes, neck pain, chest pain, shortness of breath, abdominal pain, nausea or vomiting at this time. She reports sharp left elbow pain, lower back pain, left pain, left knee pain. Patient was transported to the emergency department via EMS. No medications and route. No splints, c-collar, long spine board via EMS.  After patient had been present in the emergency department, she was endorsing increasing weakness. Patient was being assisted with urination when provider observed strong, foul odor to her urine. At this time, patient denies any flank pain, dysuria, pyuria, hematuria. She denies any fevers or chills. No abdominal pain.  Patient has a history of diabetes type 2, sleep apnea, hypertension, glaucoma, previous CVA, hypothyroidism. At this time, patient denies any complaints with her chronic medical problems.   Past Medical History:  Diagnosis Date  . Diabetes mellitus 04/2009   HgbA1c 7.1% 07/2009, Monofilament normal, Ortho pending  . Fracture    of rib  . Glaucoma    Dr. Dorcas Mcmurray  . Hypertension   . Sleep apnea    Per Pt. was supposed to start CPAP, however reluctant to try  . Stroke [facility])    Left frontal4/2010,  Left parietal and right parietal 04/2011 , Started on Plavix and then changed to Coumadin by recommendation of Dr. Chestine Spore  . Thyroid disease    hypothyroidism    Patient Active Problem List   Diagnosis Date Noted  . GERD (gastroesophageal reflux disease) 07/10/2017  . Encounter for medication review and counseling 07/02/2017  . Dizziness 06/05/2017  . CVA (cerebral vascular accident) (HCC) 06/05/2017  . Nonintractable headache 02/03/2017  . Syncope 10/19/2016  . Dysuria 01/17/2016  . Candidal dermatitis 01/17/2016  . Back pain 06/27/2015  . Diabetic polyneuropathy associated with type 2 diabetes mellitus (HCC) 08/19/2014  . Edema 06/04/2013  . Pre-ulcerative corn or callous 01/13/2013  . Anxiety and depression 05/12/2012  . Arthralgia 01/09/2012  . Gait instability 12/02/2011  . Hypertension 09/11/2011  . Hyperlipidemia 09/11/2011  . History of stroke 09/11/2011  . DM (diabetes mellitus), type 2 with complications (HCC) 09/11/2011  . Hypothyroidism 09/11/2011    Past Surgical History:  Procedure Laterality Date  . orthopedic surgeon     Dr. Yisroel Ramming  . TOTAL ABDOMINAL HYSTERECTOMY    . TOTAL SHOULDER REPLACEMENT Right   . WRIST SURGERY Left     Prior to Admission medications   Medication Sig Start Date End Date Taking? Authorizing Provider  amLODipine (NORVASC) 5 MG tablet Take 1 tablet (5 mg total) by mouth daily. 04/09/17   Allegra Grana, FNP  aspirin 81 MG tablet Take 81 mg by mouth daily.      [provider]  atorvastatin (LIPITOR) 40 MG tablet Take 1 tablet (40 mg total) by mouth  daily. 01/09/17   Burnard Hawthorne, FNP  Blood Glucose Monitoring Suppl (ONE TOUCH ULTRA SYSTEM KIT) W/DEVICE KIT 1 kit by Does not apply route once. 08/19/14   Jackolyn Confer, MD  cefdinir (OMNICEF) 300 MG capsule Take 1 capsule (300 mg total) by mouth 2 (two) times daily. 07/18/17   Coral Spikes, DO  cloNIDine (CATAPRES) 0.2 MG tablet Take 1 tablet (0.2 mg total) by mouth at  bedtime. 01/22/17   Burnard Hawthorne, FNP  glipiZIDE (GLUCOTROL) 5 MG tablet TAKE 1 TABLET BY MOUTH TWICE DAILY BEFORE A MEAL 08/08/17   Burnard Hawthorne, FNP  glucose blood (ONE TOUCH ULTRA TEST) test strip USE ONE STRIP TO CHECK GLUCOSE TWICE DAILY E11.8 09/05/16   Coral Spikes, DO  Lancets [facility] ULTRASOFT) lancets Use as instructed 09/03/16   Coral Spikes, DO  levothyroxine (SYNTHROID, LEVOTHROID) 75 MCG tablet Take 1 tablet (75 mcg total) by mouth daily. 01/22/17   Burnard Hawthorne, FNP  mirtazapine (REMERON) 30 MG tablet TAKE ONE TABLET BY MOUTH AT BEDTIME 04/09/17   Burnard Hawthorne, FNP  omeprazole (PRILOSEC) 20 MG capsule Take 1 capsule (20 mg total) by mouth daily. 07/08/17   Burnard Hawthorne, FNP  ranitidine (ZANTAC) 150 MG tablet Take 1 tablet (150 mg total) by mouth 2 (two) times daily. 07/04/17   Burnard Hawthorne, FNP  sitaGLIPtin (JANUVIA) 25 MG tablet Take 1 tablet (25 mg total) by mouth daily. 05/26/17   Burnard Hawthorne, FNP    Allergies Beta adrenergic blockers; Cephalosporins; Lisinopril; Macrobid [nitrofurantoin macrocrystal]; Penicillins; and Toprol xl [metoprolol succinate]  Family History  Problem Relation Age of Onset  . CAD Mother   . CAD Father   . Diabetes Mellitus I Brother   . Diabetes Mellitus II Maternal Grandmother     Social History Social History  Substance Use Topics  . Smoking status: Never Smoker  . Smokeless tobacco: Never Used  . Alcohol use No     Review of Systems  Constitutional: No fever/chills Eyes: No visual changes.  Cardiovascular: no chest pain. Respiratory: no cough. No SOB. Gastrointestinal: No abdominal pain.  No nausea, no vomiting.  Genitourinary: Denies dysuria, polyuria, hematuria. No flank pain.  Musculoskeletal: Positive for left elbow pain, lower back pain, left hip pain, left knee pain. Skin: Negative for rash, abrasions, lacerations, ecchymosis. Neurological: Negative for headaches, focal weakness or  numbness. 10-point ROS otherwise negative.  ____________________________________________   PHYSICAL EXAM:  VITAL SIGNS: ED Triage Vitals  Enc Vitals Group     BP 08/14/17 1458 (!) 153/66     Pulse Rate 08/14/17 1458 94     Resp 08/14/17 1458 18     Temp 08/14/17 1458 98.6 F (37 C)     Temp Source 08/14/17 1458 Oral     SpO2 08/14/17 1458 95 %     Weight 08/14/17 1454 190 lb (86.2 kg)     Height --      Head Circumference --      Peak Flow --      Pain Score --      Pain Loc --      Pain Edu? --      Excl. in [place]? --      Constitutional: Alert and oriented. Well appearing and in no acute distress. Eyes: Conjunctivae are normal. PERRL. EOMI. Head: Atraumatic. ENT:      Ears:       Nose: No congestion/rhinnorhea.  Mouth/Throat: Mucous membranes are moist.  Neck: No stridor.  No cervical spine tenderness to palpation.  Cardiovascular: Normal rate, regular rhythm. Normal S1 and S2.  Good peripheral circulation. Respiratory: Normal respiratory effort without tachypnea or retractions. Lungs CTAB. Good air entry to the bases with no decreased or absent breath sounds. Gastrointestinal: Bowel sounds 4 quadrants. Soft and nontender to palpation. No guarding or rigidity. No palpable masses. No distention. No CVA tenderness. Musculoskeletal: Full range of motion to all extremities. No gross deformities appreciated.No gross deformity, edema, ecchymosis noted to the left elbow. Full range of motion to left elbow. Patient is tender to palpation over the olecranon process with no palpable abnormality. No other tenderness to palpation over the left elbow. Radial pulse intact distally. Sensation intact distally. Examination of the lumbar spine reveals no deformity. Patient is very tender to palpation midline over L3-L5. No palpable abnormality or step-off. Patient is also very tender to palpation over the SI joint left side. She is tender to palpation along the iliac crest as well as the  proximal femur. No palpable abnormality with palpation over the pelvis and left hip. Visualization of the left knee reveals no deformity or acute edema. Limited range of motion due to pain. Patient is very tender to palpation over the distal femur, patella. No palpable abnormality. No stress testing undertaken prior to x-rays. Dorsalis pedis pulse intact distally. Sensation intact distally. Neurologic:  Normal speech and language. No gross focal neurologic deficits are appreciated.  Skin:  Skin is warm, dry and intact. No rash noted. Psychiatric: Mood and affect are normal. Speech and behavior are normal. Patient exhibits appropriate insight and judgement.   ____________________________________________   LABS (all labs ordered are listed, but only abnormal results are displayed)  Labs Reviewed  URINALYSIS, COMPLETE (UACMP) WITH MICROSCOPIC - Abnormal; Notable for the following:       Result Value   Color, Urine YELLOW (*)    APPearance HAZY (*)    Glucose, UA 150 (*)    Protein, ur 30 (*)    Leukocytes, UA LARGE (*)    Bacteria, UA RARE (*)    Squamous Epithelial / LPF 0-5 (*)    All other components within normal limits  COMPREHENSIVE METABOLIC PANEL - Abnormal; Notable for the following:    Glucose, Bld 219 (*)    GFR calc non Af Amer 56 (*)    All other components within normal limits  CBC WITH DIFFERENTIAL/PLATELET - Abnormal; Notable for the following:    WBC 15.1 (*)    Neutro Abs 13.0 (*)    Basophils Absolute 0.2 (*)    All other components within normal limits  URINE CULTURE   ____________________________________________  EKG  ED ECG REPORT I, Charline Bills Cuthriell,  personally viewed and interpreted this ECG.   Date: 07/30/2017  EKG Time: 1708  Rate: 86 bpm  Rhythm: normal sinus rhythm, occasional PVC noted, unifocal, 1st degree AV block  Axis: Normal axis  Intervals:first-degree A-V block   ST&T Change: No ST elevations or depressions noted.  EKG stable when  compared with previous EKGs.  ____________________________________________  RADIOLOGY Diamantina Providence Cuthriell, personally viewed and evaluated these images (plain radiographs) as part of my medical decision making, as well as reviewing the written report by the radiologist.  Dg Lumbar Spine Complete  Result Date: 08/14/2017 CLINICAL DATA:  Low back pain post fall. EXAM: LUMBAR SPINE - COMPLETE 4+ VIEW COMPARISON:  10/18/2016 FINDINGS: There is no evidence of lumbar spine fracture.  Exaggerated lumbosacral lordosis with multilevel osteoarthritic changes, mainly disc space narrowing, discogenic endplate sclerosis and osteophyte formation. Minimal anterolisthesis of L5 on S1, likely due to facet arthropathy. Right upper quadrant postsurgical clips and abdominal wall hernia repair anchors noted. IMPRESSION: No radiographic evidence of lumbosacral spine fracture. Multilevel osteoarthritic changes and posterior facet arthropathy. Electronically Signed   By: Fidela Salisbury M.D.   On: 08/14/2017 16:03   Dg Elbow Complete Left  Result Date: 08/14/2017 CLINICAL DATA:  Fall.  Left elbow pain. EXAM: LEFT ELBOW - COMPLETE 3+ VIEW COMPARISON:  None. FINDINGS: There is no evidence of fracture, dislocation, or joint effusion. There is no evidence of arthropathy or other focal bone abnormality. Soft tissues are unremarkable. IMPRESSION: Negative. Electronically Signed   By: Lajean Manes M.D.   On: 08/14/2017 16:01   Dg Knee Complete 4 Views Left  Result Date: 08/14/2017 CLINICAL DATA:  83 year old female status post fall onto knee today with pain. EXAM: LEFT KNEE - COMPLETE 4+ VIEW COMPARISON:  None. FINDINGS: No joint effusion identified on the cross-table lateral view. Joint spaces and alignment are normal for age. Patella appears intact. Osteopenia. No fracture identified. Generalized soft tissue stranding in the visible left lower extremity. There is some calcified peripheral vascular disease. IMPRESSION:  No acute fracture or dislocation identified about the left knee. Electronically Signed   By: Genevie Ann M.D.   On: 08/14/2017 15:23   Dg Hip Unilat W Or Wo Pelvis 2-3 Views Left  Result Date: 08/14/2017 CLINICAL DATA:  Pain after fall. EXAM: DG HIP (WITH OR WITHOUT PELVIS) 2-3V LEFT COMPARISON:  None. FINDINGS: There is no evidence of hip fracture or dislocation. There is no evidence of arthropathy or other focal bone abnormality. IMPRESSION: Negative. Electronically Signed   By: Dorise Bullion III M.D   On: 08/14/2017 16:03    ____________________________________________    PROCEDURES  Procedure(s) performed:    Procedures    Medications  cefTRIAXone (ROCEPHIN) 1 g in dextrose 5 % 50 mL IVPB (not administered)  ondansetron (ZOFRAN-ODT) disintegrating tablet 8 mg (8 mg Oral Given 08/14/17 1650)  sodium chloride 0.9 % bolus 1,000 mL (1,000 mLs Intravenous New Bag/Given 08/14/17 1650)     ____________________________________________   INITIAL IMPRESSION / ASSESSMENT AND PLAN / ED COURSE  Pertinent labs & imaging results that were available during my care of the patient were reviewed by me and considered in my medical decision making (see chart for details).  Review of the [place] CSRS was performed in accordance of the [place] prior to dispensing any controlled drugs.  Clinical Course as of Aug 14 1745  Thu Aug 14, 2017  1633 Patient presented to the emergency department initially for complaint of fall with musculoskeletal pain. While patient was here, she requested assistance using the restroom. Patient at this time was too weak to even stand. Urine has a strong, foul odor. At this time, further workup to determine whether patient has a UTI causing weakness leading to her fall. X-rays at this time are reassuring with no acute osseous abnormality.  [JC]    Clinical Course User Index [JC] Cuthriell, Charline Bills, PA-C    Patient's diagnosis is consistent with Fall resulting in left elbow,  left hip, left knee contusion, lumbar strain. While here, patient endorsed increasing weakness. When patient was assisted with urination, strong foul odor was observed. Further workup was undertaken. This returns with UTI with elevated white blood cell count. This explains patient's increasing weakness which led to her fall  today. Discussed the case with attending provider, Dr. Mariea Clonts. At this time, hospitalist service is contacted for admission. They accept the patient for weakness and UTI. Patient does have a penicillin-based allergy which she reports as a rash. At this time, patient will be placed on Rocephin for her UTI as it appears she has taken cephalosporins with no issues.. Patient care transferred to hospitalist service.     ____________________________________________  FINAL CLINICAL IMPRESSION(S) / ED DIAGNOSES  Final diagnoses:  Fall, initial encounter  Contusion of left elbow, initial encounter  Contusion of left hip, initial encounter  Strain of lumbar region, initial encounter  Contusion of left knee, initial encounter  Acute cystitis without hematuria  Weakness      NEW MEDICATIONS STARTED DURING THIS VISIT:  New Prescriptions   No medications on file        This chart was dictated using voice recognition software/Dragon. Despite best efforts to proofread, errors can occur which can change the meaning. Any change was purely unintentional.    Darletta Moll, PA-C 08/14/17 1746    Cuthriell, Charline Bills, PA-C 08/14/17 1746    Eula Listen, MD 08/14/17 [phone]

## 2017-08-14 NOTE — H&P (Signed)
[facility] [place] NAME: Alexandra English    MR#:  [id number]  DATE OF BIRTH:  [birth year]  DATE OF ADMISSION:  08/14/2017  PRIMARY CARE PHYSICIAN: Burnard Hawthorne, FNP   REQUESTING/REFERRING PHYSICIAN:   CHIEF COMPLAINT:  Fall with back pain and nausea  HISTORY OF PRESENT ILLNESS:  Alexandra English  is a 83 y.o. female with a known history of non-insulin-requiring diabetes mellitus, hypertension, hyperlipidemia, hypothyroidism, chronically bedbound, amylase with wheelchair is brought into the ED after she sustained a fall. Patient landed on left knee and hit her left elbow. X-rays were normal but urinalysis is abnormal patient is feeling weak, reporting abdominal pain, low back pain associated with nausea but denies any vomiting.UA is abnormal. Patient is started on IV Rocephin in ED.patient was given by mouth Omnicef at home with no improvement. Hospitalist team is called to admit the patient  PAST MEDICAL HISTORY:   Past Medical History:  Diagnosis Date  . Diabetes mellitus 04/2009   HgbA1c 7.1% 07/2009, Monofilament normal, Ortho pending  . Fracture    of rib  . Glaucoma    Dr. Thomasene Ripple  . Hypertension   . Sleep apnea    Per Pt. was supposed to start CPAP, however reluctant to try  . Stroke [facility])    Left frontal4/2010, Left parietal and right parietal 04/2011 , Started on Plavix and then changed to Coumadin by recommendation of Dr. Carlis Abbott  . Thyroid disease    hypothyroidism    PAST SURGICAL HISTOIRY:   Past Surgical History:  Procedure Laterality Date  . orthopedic surgeon     Dr. Latanya Maudlin  . TOTAL ABDOMINAL HYSTERECTOMY    . TOTAL SHOULDER REPLACEMENT Right   . WRIST SURGERY Left     SOCIAL HISTORY:   Social History  Substance Use Topics  . Smoking status: Never Smoker  . Smokeless tobacco: Never Used  . Alcohol use No    FAMILY HISTORY:   Family History  Problem Relation Age of Onset  . CAD Mother   . CAD  Father   . Diabetes Mellitus I Brother   . Diabetes Mellitus II Maternal Grandmother     DRUG ALLERGIES:   Allergies  Allergen Reactions  . Beta Adrenergic Blockers   . Cephalosporins     Taken omnicef without a problem. MGA 07/2017  . Lisinopril   . Macrobid [Nitrofurantoin Macrocrystal]   . Penicillins   . Toprol Xl [Metoprolol Succinate]     REVIEW OF SYSTEMS:  CONSTITUTIONAL: No fever, fatigue or weakness.  EYES: No blurred or double vision.  EARS, NOSE, AND THROAT: No tinnitus or ear pain.  RESPIRATORY: No cough, shortness of breath, wheezing or hemoptysis.  CARDIOVASCULAR: No chest pain, orthopnea, edema.  GASTROINTESTINAL: reporting nausea, lower abdominal pain and low back pain denies any vomiting or diarrhea   GENITOURINARY: No dysuria, hematuria.  ENDOCRINE: No polyuria, nocturia,  HEMATOLOGY: No anemia, easy bruising or bleeding SKIN: No rash or lesion. MUSCULOSKELETAL: reporting body aches and weakness and fall NEUROLOGIC: No tingling, numbness, weakness.  PSYCHIATRY: No anxiety or depression.   MEDICATIONS AT HOME:   Prior to Admission medications   Medication Sig Start Date End Date Taking? Authorizing Provider  amLODipine (NORVASC) 5 MG tablet Take 1 tablet (5 mg total) by mouth daily. 04/09/17   Burnard Hawthorne, FNP  aspirin 81 MG tablet Take 81 mg by mouth daily.      [provider]  atorvastatin (LIPITOR) 40  MG tablet Take 1 tablet (40 mg total) by mouth daily. 01/09/17   Burnard Hawthorne, FNP  Blood Glucose Monitoring Suppl (ONE TOUCH ULTRA SYSTEM KIT) W/DEVICE KIT 1 kit by Does not apply route once. 08/19/14   Jackolyn Confer, MD  cefdinir (OMNICEF) 300 MG capsule Take 1 capsule (300 mg total) by mouth 2 (two) times daily. 07/18/17   Coral Spikes, DO  cloNIDine (CATAPRES) 0.2 MG tablet Take 1 tablet (0.2 mg total) by mouth at bedtime. 01/22/17   Burnard Hawthorne, FNP  glipiZIDE (GLUCOTROL) 5 MG tablet TAKE 1 TABLET BY MOUTH TWICE DAILY BEFORE  A MEAL 08/08/17   Burnard Hawthorne, FNP  glucose blood (ONE TOUCH ULTRA TEST) test strip USE ONE STRIP TO CHECK GLUCOSE TWICE DAILY E11.8 09/05/16   Coral Spikes, DO  Lancets [facility] ULTRASOFT) lancets Use as instructed 09/03/16   Coral Spikes, DO  levothyroxine (SYNTHROID, LEVOTHROID) 75 MCG tablet Take 1 tablet (75 mcg total) by mouth daily. 01/22/17   Burnard Hawthorne, FNP  mirtazapine (REMERON) 30 MG tablet TAKE ONE TABLET BY MOUTH AT BEDTIME 04/09/17   Burnard Hawthorne, FNP  omeprazole (PRILOSEC) 20 MG capsule Take 1 capsule (20 mg total) by mouth daily. 07/08/17   Burnard Hawthorne, FNP  ranitidine (ZANTAC) 150 MG tablet Take 1 tablet (150 mg total) by mouth 2 (two) times daily. 07/04/17   Burnard Hawthorne, FNP  sitaGLIPtin (JANUVIA) 25 MG tablet Take 1 tablet (25 mg total) by mouth daily. 05/26/17   Burnard Hawthorne, FNP      VITAL SIGNS:  Blood pressure (!) 158/63, pulse 87, temperature 98.6 F (37 C), temperature source Oral, resp. rate 20, weight 86.2 kg (190 lb), SpO2 94 %.  PHYSICAL EXAMINATION:  GENERAL:  83 y.o.-year-old patient lying in the bed with no acute distress.  EYES: Pupils equal, round, reactive to light and accommodation. No scleral icterus. Extraocular muscles intact.  HEENT: Head atraumatic, normocephalic. Oropharynx and nasopharynx clear.  NECK:  Supple, no jugular venous distention. No thyroid enlargement, no tenderness.  LUNGS: Normal breath sounds bilaterally, no wheezing, rales,rhonchi or crepitation. No use of accessory muscles of respiration.  CARDIOVASCULAR: S1, S2 normal. No murmurs, rubs, or gallops.  ABDOMEN: Soft, minimal lower abdominal tenderness is present no rebound tenderness, nondistended. Bowel sounds present. EXTREMITIES: No pedal edema, cyanosis, or clubbing.  NEUROLOGIC: Cranial nerves II through XII are intact. Muscle strength at her baseline in all extremities. Sensation intact. Gait not checked. No vertebral or paravertebral tenderness  but diffusely tender in the lower back area PSYCHIATRIC: The patient is alert and oriented x 3.  SKIN: No obvious rash, lesion, or ulcer.   LABORATORY PANEL:   CBC  Recent Labs Lab 08/14/17 1629  WBC 15.1*  HGB 13.0  HCT 38.0  PLT 255   ------------------------------------------------------------------------------------------------------------------  Chemistries   Recent Labs Lab 08/14/17 1629  NA 135  K 3.9  CL 102  CO2 22  GLUCOSE 219*  BUN 11  CREATININE 0.90  CALCIUM 9.0  AST 24  ALT 16  ALKPHOS 112  BILITOT 0.6   ------------------------------------------------------------------------------------------------------------------  Cardiac Enzymes No results for input(s): TROPONINI in the last 168 hours. ------------------------------------------------------------------------------------------------------------------  RADIOLOGY:  Dg Lumbar Spine Complete  Result Date: 08/14/2017 CLINICAL DATA:  Low back pain post fall. EXAM: LUMBAR SPINE - COMPLETE 4+ VIEW COMPARISON:  10/18/2016 FINDINGS: There is no evidence of lumbar spine fracture. Exaggerated lumbosacral lordosis with multilevel osteoarthritic changes, mainly disc space narrowing,  discogenic endplate sclerosis and osteophyte formation. Minimal anterolisthesis of L5 on S1, likely due to facet arthropathy. Right upper quadrant postsurgical clips and abdominal wall hernia repair anchors noted. IMPRESSION: No radiographic evidence of lumbosacral spine fracture. Multilevel osteoarthritic changes and posterior facet arthropathy. Electronically Signed   By: Fidela Salisbury M.D.   On: 08/14/2017 16:03   Dg Elbow Complete Left  Result Date: 08/14/2017 CLINICAL DATA:  Fall.  Left elbow pain. EXAM: LEFT ELBOW - COMPLETE 3+ VIEW COMPARISON:  None. FINDINGS: There is no evidence of fracture, dislocation, or joint effusion. There is no evidence of arthropathy or other focal bone abnormality. Soft tissues are unremarkable.  IMPRESSION: Negative. Electronically Signed   By: Lajean Manes M.D.   On: 08/14/2017 16:01   Dg Knee Complete 4 Views Left  Result Date: 08/14/2017 CLINICAL DATA:  83 year old female status post fall onto knee today with pain. EXAM: LEFT KNEE - COMPLETE 4+ VIEW COMPARISON:  None. FINDINGS: No joint effusion identified on the cross-table lateral view. Joint spaces and alignment are normal for age. Patella appears intact. Osteopenia. No fracture identified. Generalized soft tissue stranding in the visible left lower extremity. There is some calcified peripheral vascular disease. IMPRESSION: No acute fracture or dislocation identified about the left knee. Electronically Signed   By: Genevie Ann M.D.   On: 08/14/2017 15:23   Dg Hip Unilat W Or Wo Pelvis 2-3 Views Left  Result Date: 08/14/2017 CLINICAL DATA:  Pain after fall. EXAM: DG HIP (WITH OR WITHOUT PELVIS) 2-3V LEFT COMPARISON:  None. FINDINGS: There is no evidence of hip fracture or dislocation. There is no evidence of arthropathy or other focal bone abnormality. IMPRESSION: Negative. Electronically Signed   By: Dorise Bullion III M.D   On: 08/14/2017 16:03    EKG:   Orders placed or performed during the hospital encounter of 08/14/17  . ED EKG  . ED EKG    IMPRESSION AND PLAN:   Alexandra English  is a 83 y.o. female with a known history of non-insulin-requiring diabetes mellitus, hypertension, hyperlipidemia, hypothyroidism, chronically bedbound, amylase with wheelchair is brought into the ED after she sustained a fall. Patient landed on left knee and hit her left elbow. X-rays were normal but urinalysis is abnormal patient is feeling weak, reporting abdominal pain, low back pain associated with nausea but denies any vomiting.UA is abnormal  #ACUTE CYSTITIS MedSurg unit IV levofloxacin as patient is allergic to penicillin IV fluids Urine culture and sensitivity   #Diabetes mellitus Continue home medication glipizide and Linagliptin Check  hemoglobin A1c  #Hypothyroidism continue Synthroid  #Hypertension Continue home medication Norvasc and clonidine  #Hyperlipidemia continue Lipitor her home medication  #fall and weakness PT evaluation IV fluids Patient is chronically bedbound and wheelchair-bound  gI prophylaxis with PPI DVT prophylaxis with Lovenox subcutaneous    All the records are reviewed and case discussed with ED provider. Management plans discussed with the patient, family and they are in agreement.  CODE STATUS: FC,son HCPOA  TOTAL TIME TAKING CARE OF THIS PATIENT: 43  minutes.   Note: This dictation was prepared with Dragon dictation along with smaller phrase technology. Any transcriptional errors that result from this process are unintentional.  Nicholes Mango M.D on 08/14/2017 at 6:04 PM  Between 7am to 6pm - Pager - [phone]  After 6pm go to [email or web address] - password [email or web address] [place] [facility]  Office  [phone]  CC: Primary care physician; Burnard Hawthorne, FNP

## 2017-08-14 NOTE — ED Notes (Signed)
First Nurse Note:  [facility]  Patient reports she was trying to get out of her chair to get to the phone, felt a little dizzy and fell to the floor.  Patient denies passing out or hitting her head.  Patient is complaining of left knee pain and headache.  No obvious distress at this time.

## 2017-08-15 DIAGNOSIS — E119 Type 2 diabetes mellitus without complications: Secondary | ICD-10-CM | POA: Diagnosis not present

## 2017-08-15 DIAGNOSIS — N390 Urinary tract infection, site not specified: Secondary | ICD-10-CM | POA: Diagnosis not present

## 2017-08-15 DIAGNOSIS — R531 Weakness: Secondary | ICD-10-CM | POA: Diagnosis not present

## 2017-08-15 DIAGNOSIS — E039 Hypothyroidism, unspecified: Secondary | ICD-10-CM | POA: Diagnosis not present

## 2017-08-15 LAB — GLUCOSE, CAPILLARY
GLUCOSE-CAPILLARY: 135 mg/dL — AB (ref 65–99)
Glucose-Capillary: 203 mg/dL — ABNORMAL HIGH (ref 65–99)
Glucose-Capillary: 193 mg/dL — ABNORMAL HIGH (ref 65–99)
Glucose-Capillary: 170 mg/dL — ABNORMAL HIGH (ref 65–99)

## 2017-08-15 LAB — CBC
HEMATOCRIT: 37.4 % (ref 35.0–47.0)
HEMOGLOBIN: 12.7 g/dL (ref 12.0–16.0)
MCH: 29.8 pg (ref 26.0–34.0)
MCHC: 33.9 g/dL (ref 32.0–36.0)
MCV: 88.1 fL (ref 80.0–100.0)
PLATELETS: 242 10*3/uL (ref 150–440)
RBC: 4.25 MIL/uL (ref 3.80–5.20)
RDW: 13.6 % (ref 11.5–14.5)
WBC: 12.7 10*3/uL — AB (ref 3.6–11.0)

## 2017-08-15 LAB — COMPREHENSIVE METABOLIC PANEL
ALK PHOS: 99 U/L (ref 38–126)
ALT: 14 U/L (ref 14–54)
AST: 19 U/L (ref 15–41)
Albumin: 3.3 g/dL — ABNORMAL LOW (ref 3.5–5.0)
Anion gap: 8 (ref 5–15)
BILIRUBIN TOTAL: 0.7 mg/dL (ref 0.3–1.2)
BUN: 10 mg/dL (ref 6–20)
CALCIUM: 8.7 mg/dL — AB (ref 8.9–10.3)
CO2: 24 mmol/L (ref 22–32)
CREATININE: 0.79 mg/dL (ref 0.44–1.00)
Chloride: 108 mmol/L (ref 101–111)
GFR calc Af Amer: >60 mL/min (ref >60)
GFR calc non Af Amer: >60 mL/min (ref >60)
Glucose, Bld: 173 mg/dL — ABNORMAL HIGH (ref 65–99)
Potassium: 3.6 mmol/L (ref 3.5–5.1)
Sodium: 140 mmol/L (ref 135–145)
Total Protein: 6.9 g/dL (ref 6.5–8.1)

## 2017-08-15 LAB — TSH: TSH: 0.984 u[IU]/mL (ref 0.350–4.500)

## 2017-08-15 MED ORDER — LEVOFLOXACIN IN D5W 500 MG/100ML IV SOLN
500.0000 mg | INTRAVENOUS | Status: DC
  Administered 2017-08-15 – 2017-08-16 (×2): 500 mg via INTRAVENOUS
  Filled 2017-08-15 (×3): qty 100

## 2017-08-15 MED ORDER — GERHARDT'S BUTT CREAM
TOPICAL_CREAM | Freq: Two times a day (BID) | CUTANEOUS | Status: DC
  Administered 2017-08-15 (×2): via TOPICAL
  Administered 2017-08-16: 1 via TOPICAL
  Administered 2017-08-16 – 2017-08-18 (×4): via TOPICAL
  Filled 2017-08-15: qty 1

## 2017-08-15 MED ORDER — INSULIN ASPART 100 UNIT/ML ~~LOC~~ SOLN
0.0000 [IU] | Freq: Three times a day (TID) | SUBCUTANEOUS | Status: DC
  Administered 2017-08-15 – 2017-08-16 (×3): 2 [IU] via SUBCUTANEOUS
  Administered 2017-08-16: 5 [IU] via SUBCUTANEOUS
  Administered 2017-08-16 – 2017-08-17 (×2): 2 [IU] via SUBCUTANEOUS
  Administered 2017-08-17: 3 [IU] via SUBCUTANEOUS
  Administered 2017-08-17: 2 [IU] via SUBCUTANEOUS
  Administered 2017-08-18: 5 [IU] via SUBCUTANEOUS
  Administered 2017-08-18: 2 [IU] via SUBCUTANEOUS
  Filled 2017-08-15 (×10): qty 1

## 2017-08-15 MED ORDER — INSULIN ASPART 100 UNIT/ML ~~LOC~~ SOLN: 0.0000 [IU] | Freq: Every day | SUBCUTANEOUS | Status: DC

## 2017-08-15 MED ORDER — INSULIN GLARGINE 100 UNIT/ML ~~LOC~~ SOLN
9.0000 [IU] | Freq: Every day | SUBCUTANEOUS | Status: DC
  Administered 2017-08-15 – 2017-08-18 (×4): 9 [IU] via SUBCUTANEOUS
  Filled 2017-08-15 (×5): qty 0.09

## 2017-08-15 NOTE — Progress Notes (Signed)
Pharmacy Antibiotic Note  Alexandra English is a 83 y.o. female admitted on 08/14/2017 with UTI.  Pharmacy has been consulted for Levaquin dosing.  Plan: CrCl >4950ml/min,  which warrants a frequency change from q48h to q24h Will begin Levaquin 500 mg iv q 24 hours.   Height: 5\' 3"  (160 cm) Weight: 190 lb (86.2 kg) IBW/kg (Calculated) : 52.4  Temp (24hrs), Avg:98 F (36.7 C), Min:97.5 F (36.4 C), Max:98.6 F (37 C)   Recent Labs Lab 08/14/17 1629 08/15/17 0327  WBC 15.1* 12.7*  CREATININE 0.90 0.79    Estimated Creatinine Clearance: 51.5 mL/min (by C-G formula based on SCr of 0.79 mg/dL).    Allergies  Allergen Reactions  . Beta Adrenergic Blockers   . Cephalosporins     Taken omnicef without a problem. MGA 07/2017  . Lisinopril   . Macrobid [Nitrofurantoin Macrocrystal]   . Toprol Xl [Metoprolol Succinate]   . Penicillins Swelling and Rash    Has patient had a PCN reaction causing immediate rash, facial/tongue/throat swelling, SOB or lightheadedness with hypotension: Yes Has patient had a PCN reaction causing severe rash involving mucus membranes or skin necrosis: Unknown Has patient had a PCN reaction that required hospitalization: No Has patient had a PCN reaction occurring within the last 10 years: No If all of the above answers are "NO", then may proceed with Cephalosporin use.    Antimicrobials this admission: CTX 8/30 x 1 Levaquin 8/30 >>   Dose adjustments this admission: Levaquin 750 mg IV q48h changed to Levaquin 500 mg IV q24h   Microbiology results: 8/30 UCx: sent   Thank you for allowing pharmacy to be a part of this patient's care.  Cleopatra CedarStephanie Alveria Mcglaughlin  Pharmacy Resident  08/15/2017 1:01 PM

## 2017-08-15 NOTE — Care Management (Signed)
Notified by CSW that patient wishes to return home if possible, and request for [facility] to call patient son Gery PrayBarry [phone] to see if it is a possibly for patient to stay with her son at discharge.  I have called the patient son, however there was not an answer and the voicemail box was full

## 2017-08-15 NOTE — Care Management (Signed)
Attempted to call patient's son x2.  Voicemail box full.

## 2017-08-15 NOTE — Evaluation (Signed)
Physical Therapy Evaluation Patient Details Name: Alexandra KempBetty L English MRN: [id number] DOB: September 07, 1927 Today's Date: 08/15/2017   History of Present Illness  Pt is an 83 y.o. female presenting to hospital s/p fall (trying to get out of w/c to get to phone) with pain in L elbow, low back, L hip, and L knee.  Pt admitted with acute cystitis.  PMH includes DM, rib fx's, glaucoma, htn, strokes, TSR R, L wrist surgery.  Clinical Impression  Prior to hospital admission, pt was able to perform w/c level transfers modified independently (uses power w/c).  [facility]  Currently pt is min assist supine to sit; mod to max assist sit to supine (2 assist to boost up in bed); and unable to perform stand pivot to L bed to recliner d/t significant c/o R great toe pain with WB'ing.  Pt reporting that she did not know that she had this pain until she put weight through her R LE; nursing notified regarding pain concerns in R great toe.  Pt would benefit from skilled PT to address noted impairments and functional limitations (see below for any additional details).  Upon hospital discharge, recommend pt discharge to STR.    Follow Up Recommendations SNF    Equipment Recommendations  None recommended by PT    Recommendations for Other Services       Precautions / Restrictions Precautions Precautions: Fall Restrictions Weight Bearing Restrictions: No      Mobility  Bed Mobility Overal bed mobility: Needs Assistance Bed Mobility: Supine to Sit;Sit to Supine     Supine to sit: Min assist;HOB elevated Sit to supine: Mod assist;Max assist   General bed mobility comments: increased effort and time to perform with use of bed rail and vc's for technique; assist for trunk and B LE's sit to supine; 2 assist to boost up in bed  Transfers Overall transfer level: Needs assistance Equipment used: None Transfers: Stand Pivot Transfers           General transfer comment: Pt unable  to clear bottom from bed to perform stand pivot bed to recliner to L with 1 assist (x2 trials but pt c/o too much R great toe pain with WB'ing so pt assist back to bed)  Ambulation/Gait             General Gait Details: Deferred d/t pt non-ambulatory baseline  Stairs            Wheelchair Mobility    Modified Rankin (Stroke Patients Only)       Balance Overall balance assessment: Needs assistance Sitting-balance support: Bilateral upper extremity supported;Feet supported Sitting balance-Leahy Scale: Poor Sitting balance - Comments: Requires UE support for sitting balance                                     Pertinent Vitals/Pain Pain Assessment: Faces Faces Pain Scale: Hurts whole lot Pain Location: R great toe Pain Descriptors / Indicators: Tender;Sharp;Shooting;Sore Pain Intervention(s): Limited activity within patient's tolerance;Monitored during session;Repositioned;Patient requesting pain meds-RN notified  Vitals (HR and O2 on room air) stable and WFL throughout treatment session.    Home Living Family/patient expects to be discharged to:: Other (Comment)                 [facility]    Prior Function Level of Independence: Needs assistance   Gait / Transfers  Assistance Needed: Uses electric w/c for mobility and sometimes uses RW for transfers.  Does not ambulate per pt report.           Hand Dominance        Extremity/Trunk Assessment   Upper Extremity Assessment Upper Extremity Assessment: Generalized weakness    Lower Extremity Assessment Lower Extremity Assessment: Generalized weakness       Communication   Communication: HOH  Cognition Arousal/Alertness: Awake/alert Behavior During Therapy: WFL for tasks assessed/performed Overall Cognitive Status:  (Oriented to self, situation, and place)                                 General Comments: Pt often stating "I  can't remember" when asking for details regarding mobility.      General Comments General comments (skin integrity, edema, etc.): Pt resting in bed upon PT arrival and nursing removed external urinary catheter for session's activities (nursing notified after session of need to replace catheter)    Exercises     Assessment/Plan    PT Assessment Patient needs continued PT services  PT Problem List Decreased strength;Decreased balance;Decreased mobility;Pain       PT Treatment Interventions DME instruction;Functional mobility training;Therapeutic activities;Therapeutic exercise;Balance training;Patient/family education    PT Goals (Current goals can be found in the Care Plan section)  Acute Rehab PT Goals Patient Stated Goal: to have less pain PT Goal Formulation: With patient Time For Goal Achievement: 08/29/17 Potential to Achieve Goals: Fair    Frequency Min 2X/week   Barriers to discharge Decreased caregiver support      Co-evaluation               AM-PAC PT "6 Clicks" Daily Activity  Outcome Measure Difficulty turning over in bed (including adjusting bedclothes, sheets and blankets)?: Unable Difficulty moving from lying on back to sitting on the side of the bed? : Unable Difficulty sitting down on and standing up from a chair with arms (e.g., wheelchair, bedside commode, etc,.)?: Unable Help needed moving to and from a bed to chair (including a wheelchair)?: Total Help needed walking in hospital room?: Total Help needed climbing 3-5 steps with a railing? : Total 6 Click Score: 6    End of Session Equipment Utilized During Treatment: Gait belt Activity Tolerance: Patient limited by pain Patient left: in bed;with call bell/phone within reach;with bed alarm set Nurse Communication: Mobility status;Patient requests pain meds;Precautions;Other (comment) (Pt's c/o significant R great toe pain) PT Visit Diagnosis: Other abnormalities of gait and mobility  (R26.89);Muscle weakness (generalized) (M62.81);History of falling (Z91.81);Pain Pain - Right/Left: Right Pain - part of body: Ankle and joints of foot    Time: 1610-9604 PT Time Calculation (min) (ACUTE ONLY): 21 min   Charges:   PT Evaluation $PT Eval Low Complexity: 1 Low     PT G Codes:   PT G-Codes **NOT FOR INPATIENT CLASS** Functional Assessment Tool Used: AM-PAC 6 Clicks Basic Mobility Functional Limitation: Mobility: Walking and moving around Mobility: Walking and Moving Around Current Status (V4098): 100 percent impaired, limited or restricted Mobility: Walking and Moving Around Discharge Status [phone]): At least 40 percent but less than 60 percent impaired, limited or restricted    Hendricks Limes, PT 08/15/17, 1:26 PM [phone]

## 2017-08-15 NOTE — Progress Notes (Addendum)
[facility] - [place] at [facility]   PATIENT NAME: Cathi RoanBetty Shadd    MR#:  [id number]  DATE OF BIRTH:  11/26/1927  SUBJECTIVE:   Patient feeling better this morning. Still has some weakness however improved since admission.  REVIEW OF SYSTEMS:    Review of Systems  Constitutional: Negative for fever, chills weight loss Positive generalized weakness HENT: Negative for ear pain, nosebleeds, congestion, facial swelling, rhinorrhea, neck pain, neck stiffness and ear discharge.   Respiratory: Negative for cough, shortness of breath, wheezing  Cardiovascular: Negative for chest pain, palpitations and leg swelling.  Gastrointestinal: Negative for heartburn, abdominal pain, vomiting, diarrhea or consitpation Genitourinary: Denies dysuria, frequency or urgency Musculoskeletal: Negative for back pain or joint pain Neurological: Negative for dizziness, seizures, syncope, focal weakness,  numbness and headaches.  Hematological: Does not bruise/bleed easily.  Psychiatric/Behavioral: Negative for hallucinations, confusion, dysphoric mood    Tolerating Diet: yes      DRUG ALLERGIES:   Allergies  Allergen Reactions  . Beta Adrenergic Blockers   . Cephalosporins     Taken omnicef without a problem. MGA 07/2017  . Lisinopril   . Macrobid [Nitrofurantoin Macrocrystal]   . Toprol Xl [Metoprolol Succinate]   . Penicillins Swelling and Rash    Has patient had a PCN reaction causing immediate rash, facial/tongue/throat swelling, SOB or lightheadedness with hypotension: Yes Has patient had a PCN reaction causing severe rash involving mucus membranes or skin necrosis: Unknown Has patient had a PCN reaction that required hospitalization: No Has patient had a PCN reaction occurring within the last 10 years: No If all of the above answers are "NO", then may proceed with Cephalosporin use.    VITALS:  Blood pressure (!) 151/51, pulse (!) 56, temperature 97.6 F (36.4 C),  temperature source Oral, resp. rate 20, height 5\' 3"  (1.6 m), weight 86.2 kg (190 lb), SpO2 98 %.  PHYSICAL EXAMINATION:  Constitutional: Appears overweight well nourished. No distress. HENT: Normocephalic. Marland Kitchen. Oropharynx is clear and moist.  Eyes: Conjunctivae and EOM are normal. PERRLA, no scleral icterus.  Neck: Normal ROM. Neck supple. No JVD. No tracheal deviation. CVS: RRR, S1/S2 +, no murmurs, no gallops, no carotid bruit.  Pulmonary: Effort and breath sounds normal, no stridor, rhonchi, wheezes, rales.  Abdominal: Soft. BS +,  no distension, tenderness, rebound or guarding.  Musculoskeletal: Normal range of motion. No edema Neuro: Alert. CN 2-12 grossly intact. No focal deficits. Skin: Skin is warm and dry. Left and right subpannicular abdominal fold, erythematous, itches and denuded Intertriginous dermatitis to abdominal folds Psychiatric: Normal mood and affect.      LABORATORY PANEL:   CBC  Recent Labs Lab 08/15/17 0327  WBC 12.7*  HGB 12.7  HCT 37.4  PLT 242   ------------------------------------------------------------------------------------------------------------------  Chemistries   Recent Labs Lab 08/15/17 0327  NA 140  K 3.6  CL 108  CO2 24  GLUCOSE 173*  BUN 10  CREATININE 0.79  CALCIUM 8.7*  AST 19  ALT 14  ALKPHOS 99  BILITOT 0.7   ------------------------------------------------------------------------------------------------------------------  Cardiac Enzymes No results for input(s): TROPONINI in the last 168 hours. ------------------------------------------------------------------------------------------------------------------  RADIOLOGY:  Dg Lumbar Spine Complete  Result Date: 08/14/2017 CLINICAL DATA:  Low back pain post fall. EXAM: LUMBAR SPINE - COMPLETE 4+ VIEW COMPARISON:  10/18/2016 FINDINGS: There is no evidence of lumbar spine fracture. Exaggerated lumbosacral lordosis with multilevel osteoarthritic changes, mainly disc  space narrowing, discogenic endplate sclerosis and osteophyte formation. Minimal anterolisthesis of L5 on S1, likely due  to facet arthropathy. Right upper quadrant postsurgical clips and abdominal wall hernia repair anchors noted. IMPRESSION: No radiographic evidence of lumbosacral spine fracture. Multilevel osteoarthritic changes and posterior facet arthropathy. Electronically Signed   By: Ted Mcalpine M.D.   On: 08/14/2017 16:03   Dg Elbow Complete Left  Result Date: 08/14/2017 CLINICAL DATA:  Fall.  Left elbow pain. EXAM: LEFT ELBOW - COMPLETE 3+ VIEW COMPARISON:  None. FINDINGS: There is no evidence of fracture, dislocation, or joint effusion. There is no evidence of arthropathy or other focal bone abnormality. Soft tissues are unremarkable. IMPRESSION: Negative. Electronically Signed   By: Amie Portland M.D.   On: 08/14/2017 16:01   Dg Knee Complete 4 Views Left  Result Date: 08/14/2017 CLINICAL DATA:  83 year old female status post fall onto knee today with pain. EXAM: LEFT KNEE - COMPLETE 4+ VIEW COMPARISON:  None. FINDINGS: No joint effusion identified on the cross-table lateral view. Joint spaces and alignment are normal for age. Patella appears intact. Osteopenia. No fracture identified. Generalized soft tissue stranding in the visible left lower extremity. There is some calcified peripheral vascular disease. IMPRESSION: No acute fracture or dislocation identified about the left knee. Electronically Signed   By: Odessa Fleming M.D.   On: 08/14/2017 15:23   Dg Hip Unilat W Or Wo Pelvis 2-3 Views Left  Result Date: 08/14/2017 CLINICAL DATA:  Pain after fall. EXAM: DG HIP (WITH OR WITHOUT PELVIS) 2-3V LEFT COMPARISON:  None. FINDINGS: There is no evidence of hip fracture or dislocation. There is no evidence of arthropathy or other focal bone abnormality. IMPRESSION: Negative. Electronically Signed   By: Gerome Sam III M.D   On: 08/14/2017 16:03     ASSESSMENT AND PLAN:   83 year old  female with history of diabetes and hypothyroidism who is chronically bedbound presented after a fall and found to have UTI and complaining of generalized weakness.  1. Generalized weakness due to urinary tract infection and underlying chronic deconditioned state Follow-up on final urine culture (patient was recently treated for Escherichia coli UTI on Omnicef) Continue Rocephin PT consultation requested 2. Urinary tract infection: Plan as outlined above  3. Essential hypertension: Continue clonidine and amlodipine  4. Hyperlipidemia: Continue statin  5. Diabetes: Continue oral diabetic medications with sliding scale insulin and ADA diet  6. Hypothyroidism: Continue Synthroid  7. :Moisture associated skin damage (MASD)  And pressure to right buttock, present on admission.  Intertriginous dermatitis (fungal overgrowth to subpannicular fold): Cleanse right buttocks with soap and water.  Gerhardt barrier cream twice daily.  Avoid use of disposable briefs and underpads (has urinary diversion device in place) Interdry Ag to abdominal pannus:  Measure and cut length of InterDry Ag+ to fit in skin folds that have skin breakdown Tuck InterDry  Ag+ fabric into skin folds in a single layer, allow for 2 inches of overhang from skin edges to allow for wicking to occur May remove to bathe; dry area thoroughly and then tuck into affected areas again Do not apply any creams or ointments when using InterDry Ag+ DO NOT THROW AWAY FOR 5 DAYS unless soiled with stool DO NOT [facility] product, this will inactivate the silver in the material  New sheet of Interdry Ag+ should be applied after 5 days of use if patient continues to have skin breakdown    Management plans discussed with the patient and she is in agreement.  CODE STATUS:  full  TOTAL TIME TAKING CARE OF THIS PATIENT: 30 minutes.  POSSIBLE D/C tomorrow, DEPENDING ON CLINICAL CONDITION.   Alina Gilkey M.D on 08/15/2017 at 9:13 AM  Between  7am to 6pm - Pager - [phone] After 6pm go to [email or web address] - password [email or web address]  [facility]  Office  [phone]  CC: Primary care physician; Allegra Grana, FNP  Note: This dictation was prepared with Dragon dictation along with smaller phrase technology. Any transcriptional errors that result from this process are unintentional.

## 2017-08-15 NOTE — Progress Notes (Signed)
Inpatient Diabetes Program Recommendations  AACE/ADA: New Consensus Statement on Inpatient Glycemic Control (2015)  Target Ranges:  Prepandial:   less than 140 mg/dL      Peak postprandial:   less than 180 mg/dL (1-2 hours)      Critically ill patients:  140 - 180 mg/dL   Lab Results  Component Value Date   GLUCAP 193 (H) 08/15/2017   HGBA1C 8.8 (H) 07/11/2017    Review of Glycemic Control  Results for Alexandra English, [name] (MRN [id number]) as of 08/15/2017 13:06  Ref. Range 08/15/2017 08:02 08/15/2017 11:31  Glucose-Capillary Latest Ref Range: 65 - 99 mg/dL 409203 (H) 811193 (H)    Diabetes history: Type 2 Outpatient Diabetes medications: Glipizide 5mg  qday, Januvia 25mg /day Current orders for Inpatient glycemic control: Glipizide 5mg  qday, Tradjenta qday, Novolog 0-9 units tid, Novolog 0-5 units qhs  Inpatient Diabetes Program Recommendations: Consider d/c Glipizide while patient is in the hospital.    Start Lantus 9 units qhs.   Susette RacerJulie Jametta Moorehead, RN, BA, MHA, CDE Diabetes Coordinator Inpatient Diabetes Program  [phone] (Team Pager) [phone] [facility] Office) 08/15/2017 1:08 PM

## 2017-08-15 NOTE — [place] FL2 (Signed)
[place] MEDICAID FL2 LEVEL OF CARE SCREENING TOOL     IDENTIFICATION  Patient Name: Alexandra English Birthdate: 1927-02-24 Sex: female Admission Date (Current Location): 08/14/2017  [place]   Facility and Address:  [facility], [address], [place], [place] [address]      Provider Number: [id number]  Attending Physician Name and Address:  Adrian SaranMody, Sital, MD  Relative Name and Phone Number:       Current Level of Care: Hospital Recommended Level of Care: Skilled Nursing Facility Prior Approval Number:    Date Approved/Denied:   PASRR Number: [id number]  Discharge Plan: SNF    Current Diagnoses: Patient Active Problem List   Diagnosis Date Noted  . Acute cystitis 08/14/2017  . GERD (gastroesophageal reflux disease) 07/10/2017  . Encounter for medication review and counseling 07/02/2017  . Dizziness 06/05/2017  . CVA (cerebral vascular accident) (HCC) 06/05/2017  . Nonintractable headache 02/03/2017  . Syncope 10/19/2016  . Dysuria 01/17/2016  . Candidal dermatitis 01/17/2016  . Back pain 06/27/2015  . Diabetic polyneuropathy associated with type 2 diabetes mellitus (HCC) 08/19/2014  . Edema 06/04/2013  . Pre-ulcerative corn or callous 01/13/2013  . Anxiety and depression 05/12/2012  . Arthralgia 01/09/2012  . Gait instability 12/02/2011  . Hypertension 09/11/2011  . Hyperlipidemia 09/11/2011  . History of stroke 09/11/2011  . DM (diabetes mellitus), type 2 with complications (HCC) 09/11/2011  . Hypothyroidism 09/11/2011    Orientation RESPIRATION BLADDER Height & Weight     Self, Time, Situation, Place  Normal Continent Weight: 190 lb (86.2 kg) Height:  5\' 3"  (160 cm)  BEHAVIORAL SYMPTOMS/MOOD NEUROLOGICAL BOWEL NUTRITION STATUS   (none)   Continent    AMBULATORY STATUS COMMUNICATION OF NEEDS Skin    (baseline is pivoting) Verbally PU Stage and Appropriate Care                        Personal Care Assistance Level of Assistance  Bathing, Dressing Bathing Assistance: Limited assistance   Dressing Assistance: Limited assistance     Functional Limitations Info             SPECIAL CARE FACTORS FREQUENCY  PT (By licensed PT)                    Contractures Contractures Info: Not present    Additional Factors Info  Code Status, Allergies Code Status Info: full Allergies Info: beta adrenergic blocker; lisinopril; macrobid; toprol xl; pcn's           Current Medications (08/15/2017):  This is the current hospital active medication list Current Facility-Administered Medications  Medication Dose Route Frequency Provider Last Rate Last Dose  . acetaminophen (TYLENOL) tablet 650 mg  650 mg Oral Q6H PRN Gouru, Aruna, MD   650 mg at 08/15/17 1252   Or  . acetaminophen (TYLENOL) suppository 650 mg  650 mg Rectal Q6H PRN Gouru, Aruna, MD      . amLODipine (NORVASC) tablet 5 mg  5 mg Oral Daily Gouru, Aruna, MD   5 mg at 08/15/17 1005  . aspirin EC tablet 81 mg  81 mg Oral Daily Gouru, Aruna, MD   81 mg at 08/15/17 1005  . atorvastatin (LIPITOR) tablet 40 mg  40 mg Oral Daily Gouru, Aruna, MD   40 mg at 08/15/17 1006  . cloNIDine (CATAPRES) tablet 0.2 mg  0.2 mg Oral QHS Gouru, Aruna, MD   0.2 mg at 08/14/17  2135  . enoxaparin (LOVENOX) injection 40 mg  40 mg Subcutaneous Q24H Gouru, Aruna, MD      . famotidine (PEPCID) tablet 20 mg  20 mg Oral QHS Gouru, Deanna Artis, MD   20 mg at 08/14/17 2139  . Gerhardt's butt cream   Topical BID Mody, Sital, MD      . insulin aspart (novoLOG) injection 0-5 Units  0-5 Units Subcutaneous QHS Mody, Sital, MD      . insulin aspart (novoLOG) injection 0-9 Units  0-9 Units Subcutaneous TID WC Adrian Saran, MD   2 Units at 08/15/17 1251  . insulin glargine (LANTUS) injection 9 Units  9 Units Subcutaneous Daily Mody, Sital, MD      . levofloxacin (LEVAQUIN) IVPB 500 mg  500 mg Intravenous Q24H Shuder, Stephanie, RPH      .  levothyroxine (SYNTHROID, LEVOTHROID) tablet 75 mcg  75 mcg Oral QAC breakfast Ramonita Lab, MD   75 mcg at 08/15/17 0814  . linagliptin (TRADJENTA) tablet 5 mg  5 mg Oral Daily Gouru, Aruna, MD   5 mg at 08/15/17 1011  . mirtazapine (REMERON) tablet 30 mg  30 mg Oral QHS Gouru, Deanna Artis, MD   30 mg at 08/14/17 2136  . pantoprazole (PROTONIX) EC tablet 40 mg  40 mg Oral Daily Gouru, Aruna, MD   40 mg at 08/15/17 1005  . polyethylene glycol (MIRALAX / GLYCOLAX) packet 17 g  17 g Oral Daily PRN Gouru, Aruna, MD         Discharge Medications: Please see discharge summary for a list of discharge medications.  Relevant Imaging Results:  Relevant Lab Results:   Additional Information ss: [id number]  York Spaniel, LCSW

## 2017-08-15 NOTE — Clinical Social Work Note (Signed)
Clinical Social Work Assessment  Patient Details  Name: Alexandra English MRN: [id number] Date of Birth: 1927/08/24  Date of referral:  08/15/17               Reason for consult:  Discharge Planning                Permission sought to share information with:  Family Supports Permission granted to share information::  Yes, Verbal Permission Granted  Name::        Agency::     Relationship::     Contact Information:     Housing/Transportation Living arrangements for the past 2 months:  Apartment Source of Information:  Patient Patient Interpreter Needed:  None Criminal Activity/Legal Involvement Pertinent to Current Situation/Hospitalization:  No - Comment as needed Significant Relationships:  Adult Children Lives with:  Self Do you feel safe going back to the place where you live?  Yes Need for family participation in patient care:  Yes (Comment)  Care giving concerns:  Patient resides at [facility] independent living and states at baseline she stays in her motorized scooter but is able to stand and pivot. She states she does use a walker sometimes for very short distances.    Social Worker assessment / plan:  CSW informed by RN CM that PT has recommended STR for patient. CSW met with patient and explained role and purpose of visit. Patient informed CSW that she resides at [facility] independent living and has for 6 years. Patient stated that she has been at [facility] in the past for rehab and that she does not wish to go to rehab again and would rather go home. Patient also stated that she really would like to go stay with her son in [place] for a few weeks until she can return to [facility] She asked that someone call him to request this. RN CM is going to attempt to make contact with him. Patient would not give CSW permission to conduct a bed search at this time. CSW has prepared the FL2 and validated the existing pasrr in the event patient changes her mind. List of facilities  provided to patient.   Employment status:  Retired Nurse, adult PT Recommendations:  [place] / Referral to community resources:     Patient/Family's Response to care:  Patient expressed appreciation for CSW visit.   Patient/Family's Understanding of and Emotional Response to Diagnosis, Current Treatment, and Prognosis:  Patient does not appear to realize the extent of her limitations if she believes that she will be able to manage at home alone at this time.   Emotional Assessment Appearance:  Appears younger than stated age Attitude/Demeanor/Rapport:   (pleasant and cooperative) Affect (typically observed):  Calm, Pleasant, Hopeful Orientation:  Oriented to Self, Oriented to Place, Oriented to  Time, Oriented to Situation Alcohol / Substance use:  Not Applicable Psych involvement (Current and /or in the community):  No (Comment)  Discharge Needs  Concerns to be addressed:  Care Coordination Readmission within the last 30 days:  No Current discharge risk:  None Barriers to Discharge:  No Barriers Identified   Alexandra Leff, LCSW 08/15/2017, 3:00 PM

## 2017-08-15 NOTE — Consult Note (Signed)
WOC Nurse wound consult note Reason for Consult:Moisture associated skin damage (MASD)  And pressure to right buttock, present on admission.  Intertriginous dermatitis (fungal overgrowth to subpannicular fold) Wound type:MASD Intertriginous dermatitis to abdominal folds Pressure Injury POA: Yes Measurement: right buttock:  3 cm x 2.4 cm x 0.2 cm  Left and right subpannicular abdominal fold, erythematous, itches and denuded Wound bed: pink and moist Drainage (amount, consistency, odor) Scant serous Periwound: Intact, blanchable erythema to right buttocks Dressing procedure/placement/frequency: Cleanse right buttocks with soap and water.  Gerhardt barrier cream twice daily.  Avoid use of disposable briefs and underpads (has urinary diversion device in place) Interdry Ag to abdominal pannus:  Measure and cut length of InterDry Ag+ to fit in skin folds that have skin breakdown Tuck InterDry  Ag+ fabric into skin folds in a single layer, allow for 2 inches of overhang from skin edges to allow for wicking to occur May remove to bathe; dry area thoroughly and then tuck into affected areas again Do not apply any creams or ointments when using InterDry Ag+ DO NOT THROW AWAY FOR 5 DAYS unless soiled with stool DO NOT [facility] product, this will inactivate the silver in the material  New sheet of Interdry Ag+ should be applied after 5 days of use if patient continues to have skin breakdown   Will not follow at this time.  Please re-consult if needed.  Maple HudsonKaren Aviyah Swetz RN BSN CWON Pager [phone]

## 2017-08-15 NOTE — Care Management (Signed)
Patient admitted from [facility] for UTI.  Patient lives at home alone.  Patient states that her son Gery PrayBarry lives in [place].  PCP [place].  Pharmacy [facility]  Patient states that at baseline she uses a motorized WC to get around the facility, and uses a walker in her apartment.  She states that the batteries in her [facility] are new, however they have stopped working and can not get them to work.  She states that her son when will be back in town on Sunday, and will not be able to fix her WC until Sunday.  Patient has used [facility] in the past.  PT has assessed patient and is currently recommending SNF.  RNCM following for discharge planning needs.

## 2017-08-16 ENCOUNTER — Inpatient Hospital Stay: Payer: Medicare Other

## 2017-08-16 DIAGNOSIS — W19XXXA Unspecified fall, initial encounter: Secondary | ICD-10-CM | POA: Diagnosis present

## 2017-08-16 DIAGNOSIS — E039 Hypothyroidism, unspecified: Secondary | ICD-10-CM | POA: Diagnosis not present

## 2017-08-16 DIAGNOSIS — S99921A Unspecified injury of right foot, initial encounter: Secondary | ICD-10-CM | POA: Diagnosis not present

## 2017-08-16 DIAGNOSIS — R531 Weakness: Secondary | ICD-10-CM | POA: Diagnosis not present

## 2017-08-16 DIAGNOSIS — M79671 Pain in right foot: Secondary | ICD-10-CM | POA: Diagnosis not present

## 2017-08-16 DIAGNOSIS — N390 Urinary tract infection, site not specified: Secondary | ICD-10-CM | POA: Diagnosis not present

## 2017-08-16 DIAGNOSIS — E119 Type 2 diabetes mellitus without complications: Secondary | ICD-10-CM | POA: Diagnosis not present

## 2017-08-16 LAB — GLUCOSE, CAPILLARY
GLUCOSE-CAPILLARY: 187 mg/dL — AB (ref 65–99)
GLUCOSE-CAPILLARY: 178 mg/dL — AB (ref 65–99)
Glucose-Capillary: 199 mg/dL — ABNORMAL HIGH (ref 65–99)
Glucose-Capillary: 257 mg/dL — ABNORMAL HIGH (ref 65–99)

## 2017-08-16 NOTE — Clinical Social Work Placement (Signed)
   CLINICAL SOCIAL WORK PLACEMENT  NOTE  Date:  08/16/2017  Patient Details  Name: Alexandra English MRN: [id number] Date of Birth: [birth year]  Clinical Social Work is seeking post-discharge placement for this patient at the Skilled  Nursing Facility level of care (*CSW will initial, date and re-position this form in  chart as items are completed):  Yes   Patient/family provided with [place] Clinical Social Work Department's list of facilities offering this level of care within the geographic area requested by the patient (or if unable, by the patient's family).  Yes   Patient/family informed of their freedom to choose among providers that offer the needed level of care, that participate in Medicare, Medicaid or managed care program needed by the patient, have an available bed and are willing to accept the patient.  Yes   Patient/family informed of [place]'s ownership interest in [facility] and [facility], as well as of the fact that they are under no obligation to receive care at these facilities.  PASRR submitted to EDS on       PASRR number received on       Existing PASRR number confirmed on 08/15/17     FL2 transmitted to all facilities in geographic area requested by pt/family on 08/16/17     FL2 transmitted to all facilities within larger geographic area on       Patient informed that his/her managed care company has contracts with or will negotiate with certain facilities, including the following:            Patient/family informed of bed offers received.  Patient chooses bed at       Physician recommends and patient chooses bed at      Patient to be transferred to   on  .  Patient to be transferred to facility by       Patient family notified on   of transfer.  Name of family member notified:        PHYSICIAN       Additional Comment:    _______________________________________________ Burton Gahan, Darleen CrockerBailey M, LCSW 08/16/2017, 4:00 PM

## 2017-08-16 NOTE — Progress Notes (Signed)
Attempted to call patient's son Allyson SabalBerry for disposition planning. Phone #[phone]. Mailbox is full.

## 2017-08-16 NOTE — Progress Notes (Addendum)
[facility] [place] at [facility]   PATIENT NAME: Alexandra English    MR#:  [id number]  DATE OF BIRTH:  May 09, 1927  SUBJECTIVE:   Patient Is doing well this morning. She does not want to go to rehabilitation. She wants to go home or stay with her son We have attempted to contact patient's son.  REVIEW OF SYSTEMS:    Review of Systems  Constitutional: Negative for fever, chills weight loss  HENT: Negative for ear pain, nosebleeds, congestion, facial swelling, rhinorrhea, neck pain, neck stiffness and ear discharge.   Respiratory: Negative for cough, shortness of breath, wheezing  Cardiovascular: Negative for chest pain, palpitations and leg swelling.  Gastrointestinal: Negative for heartburn, abdominal pain, vomiting, diarrhea or consitpation Genitourinary: Denies dysuria, frequency or urgency Musculoskeletal: Negative for back pain or joint pain Neurological: Negative for dizziness, seizures, syncope, focal weakness,  numbness and headaches.  Hematological: Does not bruise/bleed easily.  Psychiatric/Behavioral: Negative for hallucinations, confusion, dysphoric mood    Tolerating Diet: yes      DRUG ALLERGIES:   Allergies  Allergen Reactions  . Beta Adrenergic Blockers   . Cephalosporins     Taken omnicef without a problem. MGA 07/2017  . Lisinopril   . Macrobid [Nitrofurantoin Macrocrystal]   . Toprol Xl [Metoprolol Succinate]   . Penicillins Swelling and Rash    Has patient had a PCN reaction causing immediate rash, facial/tongue/throat swelling, SOB or lightheadedness with hypotension: Yes Has patient had a PCN reaction causing severe rash involving mucus membranes or skin necrosis: Unknown Has patient had a PCN reaction that required hospitalization: No Has patient had a PCN reaction occurring within the last 10 years: No If all of the above answers are "NO", then may proceed with Cephalosporin use.    VITALS:  Blood pressure (!) 143/45, pulse 62,  temperature 98 F (36.7 C), temperature source Oral, resp. rate 20, height 5\' 3"  (1.6 m), weight 86.2 kg (190 lb), SpO2 95 %.  PHYSICAL EXAMINATION:  Constitutional: Appears overweight well nourished. No distress. HENT: Normocephalic. Marland Kitchen Oropharynx is clear and moist.  Eyes: Conjunctivae and EOM are normal. PERRLA, no scleral icterus.  Neck: Normal ROM. Neck supple. No JVD. No tracheal deviation. CVS: RRR, S1/S2 +, no murmurs, no gallops, no carotid bruit.  Pulmonary: Effort and breath sounds normal, no stridor, rhonchi, wheezes, rales.  Abdominal: Soft. BS +,  no distension, tenderness, rebound or guarding.  Musculoskeletal: Normal range of motion. No edema Neuro: Alert. CN 2-12 grossly intact. No focal deficits. Skin: Skin is warm and dry. Left and right subpannicular abdominal fold, erythematous, itches and denuded Intertriginous dermatitis to abdominal folds Psychiatric: Normal mood and affect.      LABORATORY PANEL:   CBC  Recent Labs Lab 08/15/17 0327  WBC 12.7*  HGB 12.7  HCT 37.4  PLT 242   ------------------------------------------------------------------------------------------------------------------  Chemistries   Recent Labs Lab 08/15/17 0327  NA 140  K 3.6  CL 108  CO2 24  GLUCOSE 173*  BUN 10  CREATININE 0.79  CALCIUM 8.7*  AST 19  ALT 14  ALKPHOS 99  BILITOT 0.7   ------------------------------------------------------------------------------------------------------------------  Cardiac Enzymes No results for input(s): TROPONINI in the last 168 hours. ------------------------------------------------------------------------------------------------------------------  RADIOLOGY:  Dg Lumbar Spine Complete  Result Date: 08/14/2017 CLINICAL DATA:  Low back pain post fall. EXAM: LUMBAR SPINE - COMPLETE 4+ VIEW COMPARISON:  10/18/2016 FINDINGS: There is no evidence of lumbar spine fracture. Exaggerated lumbosacral lordosis with multilevel  osteoarthritic changes, mainly disc space  narrowing, discogenic endplate sclerosis and osteophyte formation. Minimal anterolisthesis of L5 on S1, likely due to facet arthropathy. Right upper quadrant postsurgical clips and abdominal wall hernia repair anchors noted. IMPRESSION: No radiographic evidence of lumbosacral spine fracture. Multilevel osteoarthritic changes and posterior facet arthropathy. Electronically Signed   By: Ted Mcalpine M.D.   On: 08/14/2017 16:03   Dg Elbow Complete Left  Result Date: 08/14/2017 CLINICAL DATA:  Fall.  Left elbow pain. EXAM: LEFT ELBOW - COMPLETE 3+ VIEW COMPARISON:  None. FINDINGS: There is no evidence of fracture, dislocation, or joint effusion. There is no evidence of arthropathy or other focal bone abnormality. Soft tissues are unremarkable. IMPRESSION: Negative. Electronically Signed   By: Amie Portland M.D.   On: 08/14/2017 16:01   Dg Knee Complete 4 Views Left  Result Date: 08/14/2017 CLINICAL DATA:  83 year old female status post fall onto knee today with pain. EXAM: LEFT KNEE - COMPLETE 4+ VIEW COMPARISON:  None. FINDINGS: No joint effusion identified on the cross-table lateral view. Joint spaces and alignment are normal for age. Patella appears intact. Osteopenia. No fracture identified. Generalized soft tissue stranding in the visible left lower extremity. There is some calcified peripheral vascular disease. IMPRESSION: No acute fracture or dislocation identified about the left knee. Electronically Signed   By: Odessa Fleming M.D.   On: 08/14/2017 15:23   Dg Hip Unilat W Or Wo Pelvis 2-3 Views Left  Result Date: 08/14/2017 CLINICAL DATA:  Pain after fall. EXAM: DG HIP (WITH OR WITHOUT PELVIS) 2-3V LEFT COMPARISON:  None. FINDINGS: There is no evidence of hip fracture or dislocation. There is no evidence of arthropathy or other focal bone abnormality. IMPRESSION: Negative. Electronically Signed   By: Gerome Sam III M.D   On: 08/14/2017 16:03      ASSESSMENT AND PLAN:   83 year old female with history of diabetes and hypothyroidism who is chronically bedbound presented after a fall and found to have UTI and complaining of generalized weakness.  1. Generalized weakness due to urinary tract infection and underlying chronic deconditioned state Follow-up on final urine culture (patient was recently treated for Escherichia coli UTI on Omnicef) Continue Abx for now PT consultation is recommending skilled nursing facility discharge however patient does not want to go to snf. Attempted to contact patient's son however unable to leave voicemail as voice mailbox is full.  2. Urinary tract infection: Plan as outlined above  3. Essential hypertension: Continue clonidine and amlodipine  4. Hyperlipidemia: Continue statin  5. Diabetes: Continue Lantus with sliding scale insulin and ADA diet At discharge patient may resume all medications 6. Hypothyroidism: Continue Synthroid  7. :Moisture associated skin damage (MASD)  And pressure to right buttock, present on admission.  Intertriginous dermatitis (fungal overgrowth to subpannicular fold): Cleanse right buttocks with soap and water.  Gerhardt barrier cream twice daily.  Avoid use of disposable briefs and underpads (has urinary diversion device in place) Interdry Ag to abdominal pannus:  Measure and cut length of InterDry Ag+ to fit in skin folds that have skin breakdown Tuck InterDry  Ag+ fabric into skin folds in a single layer, allow for 2 inches of overhang from skin edges to allow for wicking to occur May remove to bathe; dry area thoroughly and then tuck into affected areas again Do not apply any creams or ointments when using InterDry Ag+ DO NOT THROW AWAY FOR 5 DAYS unless soiled with stool DO NOT [facility] product, this will inactivate the silver in the material  New sheet  of Interdry Ag+ should be applied after 5 days of use if patient continues to have skin breakdown     Management plans discussed with the patient and she is in agreement.  CODE STATUS:  full  TOTAL TIME TAKING CARE OF THIS PATIENT:24 minutes.     POSSIBLE D/C today if we can contact patient's son for disposition planning.  Shawntelle Ungar M.D on 08/16/2017 at 7:19 AM  Between 7am to 6pm - Pager - [phone] After 6pm go to [email or web address] - password [email or web address]  [facility] [place] [facility]  Office  [phone]  CC: Primary care physician; Allegra GranaArnett, Margaret G, FNP  Note: This dictation was prepared with Dragon dictation along with smaller phrase technology. Any transcriptional errors that result from this process are unintentional.

## 2017-08-16 NOTE — Progress Notes (Signed)
Clinical Social Worker (CSW) met with patient to discuss D/C plan. Per patient the doctor told her that she had to go to rehab. [place] and [place] [facility]. Per patient she has been to [facility] in the past. Patient reported that she has not been able to get in touch with her son to discuss D/C plan. Per nurse patient's son stated yesterday that he is on his way to [place]. FL2 complete and faxed out. CSW will continue to follow and assist as needed.    , LCSW [phone]  

## 2017-08-17 ENCOUNTER — Encounter
Admission: RE | Admit: 2017-08-17 | Discharge: 2017-08-17 | Disposition: A | Discharge status: Home / Self Care | Payer: Medicare Other | Source: Ambulatory Visit | Attending: Internal Medicine | Admitting: Internal Medicine

## 2017-08-17 DIAGNOSIS — E039 Hypothyroidism, unspecified: Secondary | ICD-10-CM | POA: Diagnosis not present

## 2017-08-17 DIAGNOSIS — R531 Weakness: Secondary | ICD-10-CM | POA: Diagnosis not present

## 2017-08-17 DIAGNOSIS — E119 Type 2 diabetes mellitus without complications: Secondary | ICD-10-CM | POA: Diagnosis not present

## 2017-08-17 DIAGNOSIS — N390 Urinary tract infection, site not specified: Secondary | ICD-10-CM | POA: Diagnosis not present

## 2017-08-17 LAB — GLUCOSE, CAPILLARY
GLUCOSE-CAPILLARY: 196 mg/dL — AB (ref 65–99)
Glucose-Capillary: 197 mg/dL — ABNORMAL HIGH (ref 65–99)
Glucose-Capillary: 232 mg/dL — ABNORMAL HIGH (ref 65–99)
Glucose-Capillary: 156 mg/dL — ABNORMAL HIGH (ref 65–99)

## 2017-08-17 LAB — URINE CULTURE: Culture: >=100000 — AB

## 2017-08-17 MED ORDER — CEFUROXIME AXETIL 500 MG PO TABS
500.0000 mg | ORAL_TABLET | Freq: Two times a day (BID) | ORAL | Status: DC
  Administered 2017-08-17 – 2017-08-18 (×2): 500 mg via ORAL
  Filled 2017-08-17 (×3): qty 1

## 2017-08-17 NOTE — Care Management Obs Status (Signed)
MEDICARE OBSERVATION STATUS NOTIFICATION   Patient Details  Name: Alexandra English MRN: [id number] Date of Birth: 06/06/1927   Medicare Observation Status Notification Given:  Yes    Callin Ashe A, RN 08/17/2017, 8:35 AM

## 2017-08-17 NOTE — Plan of Care (Signed)
Problem: Pain Managment: Goal: General experience of comfort will improve Outcome: Progressing Assessing pt pain level from pressure injury on buttocks and keeping manageable

## 2017-08-17 NOTE — Care Management CC44 (Signed)
Condition Code 44 Documentation Completed  Patient Details  Name: Raynald KempBetty L Cabello MRN: [id number] Date of Birth: 11-Nov-1927   Condition Code 44 given:  Yes Patient signature on Condition Code 44 notice:  Yes Documentation of 2 MD's agreement:  Yes Code 44 added to claim:  Yes    Miyonna Ormiston A, RN 08/17/2017, 8:35 AM

## 2017-08-17 NOTE — Progress Notes (Addendum)
[facility] - [place] at [facility]   PATIENT NAME: Alexandra English    MR#:  [id number]  DATE OF BIRTH:  06-25-1927  SUBJECTIVE:   We have been unsuccessful in reaching patient's son. She still states she would like to go home if signed can take care of her. No acute events overnight  REVIEW OF SYSTEMS:    Review of Systems  Constitutional: Negative for fever, chills weight loss  HENT: Negative for ear pain, nosebleeds, congestion, facial swelling, rhinorrhea, neck pain, neck stiffness and ear discharge.   Respiratory: Negative for cough, shortness of breath, wheezing  Cardiovascular: Negative for chest pain, palpitations and leg swelling.  Gastrointestinal: Negative for heartburn, abdominal pain, vomiting, diarrhea or consitpation Genitourinary: Denies dysuria, frequency or urgency Musculoskeletal: Negative for back pain or joint pain Neurological: Negative for dizziness, seizures, syncope, focal weakness,  numbness and headaches.  Hematological: Does not bruise/bleed easily.  Psychiatric/Behavioral: Negative for hallucinations, confusion, dysphoric mood    Tolerating Diet: yes      DRUG ALLERGIES:   Allergies  Allergen Reactions  . Beta Adrenergic Blockers   . Cephalosporins     Taken omnicef without a problem. MGA 07/2017  . Lisinopril   . Macrobid [Nitrofurantoin Macrocrystal]   . Toprol Xl [Metoprolol Succinate]   . Penicillins Swelling and Rash    Has patient had a PCN reaction causing immediate rash, facial/tongue/throat swelling, SOB or lightheadedness with hypotension: Yes Has patient had a PCN reaction causing severe rash involving mucus membranes or skin necrosis: Unknown Has patient had a PCN reaction that required hospitalization: No Has patient had a PCN reaction occurring within the last 10 years: No If all of the above answers are "NO", then may proceed with Cephalosporin use.    VITALS:  Blood pressure (!) 139/56, pulse 66,  temperature 98.3 F (36.8 C), temperature source Oral, resp. rate 18, height 5\' 3"  (1.6 m), weight 86.2 kg (190 lb), SpO2 94 %.  PHYSICAL EXAMINATION:  Constitutional: Appears overweight well nourished. No distress. HENT: Normocephalic. Marland Kitchen Oropharynx is clear and moist.  Eyes: Conjunctivae and EOM are normal. PERRLA, no scleral icterus.  Neck: Normal ROM. Neck supple. No JVD. No tracheal deviation. CVS: RRR, S1/S2 +, no murmurs, no gallops, no carotid bruit.  Pulmonary: Effort and breath sounds normal, no stridor, rhonchi, wheezes, rales.  Abdominal: Soft. BS +,  no distension, tenderness, rebound or guarding.  Musculoskeletal: Normal range of motion. No edema Neuro: Alert. CN 2-12 grossly intact. No focal deficits. Skin: Skin is warm and dry. Left and right subpannicular abdominal fold, erythematous, itches and denuded Intertriginous dermatitis to abdominal folds Psychiatric: Normal mood and affect.      LABORATORY PANEL:   CBC  Recent Labs Lab 08/15/17 0327  WBC 12.7*  HGB 12.7  HCT 37.4  PLT 242   ------------------------------------------------------------------------------------------------------------------  Chemistries   Recent Labs Lab 08/15/17 0327  NA 140  K 3.6  CL 108  CO2 24  GLUCOSE 173*  BUN 10  CREATININE 0.79  CALCIUM 8.7*  AST 19  ALT 14  ALKPHOS 99  BILITOT 0.7   ------------------------------------------------------------------------------------------------------------------  Cardiac Enzymes No results for input(s): TROPONINI in the last 168 hours. ------------------------------------------------------------------------------------------------------------------  RADIOLOGY:  Dg Foot Complete Right  Result Date: 08/16/2017 CLINICAL DATA:  83 year old who fell 2 days ago and injured the right foot. Persistent pain. EXAM: RIGHT FOOT COMPLETE - 3+ VIEW COMPARISON:  05/24/2014, 12/27/2011. FINDINGS: Severe osseous demineralization. No evidence  of acute fracture or dislocation. Hammertoe deformity.  Narrowing of the IP joint spaces of the toes. Remaining joint spaces well-preserved. Flattening of the head of the second metatarsal, unchanged. Large plantar calcaneal spur. IMPRESSION: 1. No acute osseous abnormality. 2. Osseous demineralization. Electronically Signed   By: Hulan Saashomas  Lawrence M.D.   On: 08/16/2017 15:03     ASSESSMENT AND PLAN:   83 year old female with history of diabetes and hypothyroidism who is chronically bedbound presented after a fall and found to have UTI and complaining of generalized weakness.  1. Generalized weakness due to urinary tract infection and underlying chronic deconditioned state Urine culture positive for Escherichia coli follow-up susceptibilities today  Continue Abx for now PT consultation is recommending skilled nursing facility discharge however patient does not want to go to snf.   2. Urinary tract infection: Plan as outlined above  3. Essential hypertension: Continue clonidine and amlodipine  4. Hyperlipidemia: Continue statin  5. Diabetes: Continue Lantus with sliding scale insulin and ADA diet At discharge patient may resume all medications  6. Hypothyroidism: Continue Synthroid  7. :Moisture associated skin damage (MASD)  And pressure to right buttock, present on admission.  Intertriginous dermatitis (fungal overgrowth to subpannicular fold): Cleanse right buttocks with soap and water.  Gerhardt barrier cream twice daily.  Avoid use of disposable briefs and underpads (has urinary diversion device in place) Interdry Ag to abdominal pannus:  Measure and cut length of InterDry Ag+ to fit in skin folds that have skin breakdown Tuck InterDry  Ag+ fabric into skin folds in a single layer, allow for 2 inches of overhang from skin edges to allow for wicking to occur May remove to bathe; dry area thoroughly and then tuck into affected areas again Do not apply any creams or ointments when using  InterDry Ag+ DO NOT THROW AWAY FOR 5 DAYS unless soiled with stool DO NOT [facility] product, this will inactivate the silver in the material  New sheet of Interdry Ag+ should be applied after 5 days of use if patient continues to have skin breakdown    Management plans discussed with the patient and she is in agreement.  CODE STATUS:  full  TOTAL TIME TAKING CARE OF THIS PATIENT:25 minutes.     POSSIBLE D/C today if we can contact patient's son for disposition planning.  Miaisabella Bacorn M.D on 08/17/2017 at 8:06 AM  Between 7am to 6pm - Pager - [phone] After 6pm go to [email or web address] - password [email or web address]  [facility] [place] [facility]  Office  [phone]  CC: Primary care physician; Allegra GranaArnett, Margaret G, FNP  Note: This dictation was prepared with Dragon dictation along with smaller phrase technology. Any transcriptional errors that result from this process are unintentional.

## 2017-08-18 DIAGNOSIS — S39012A Strain of muscle, fascia and tendon of lower back, initial encounter: Secondary | ICD-10-CM | POA: Diagnosis not present

## 2017-08-18 DIAGNOSIS — Z888 Allergy status to other drugs, medicaments and biological substances status: Secondary | ICD-10-CM | POA: Diagnosis not present

## 2017-08-18 DIAGNOSIS — B9620 Unspecified Escherichia coli [E. coli] as the cause of diseases classified elsewhere: Secondary | ICD-10-CM | POA: Diagnosis not present

## 2017-08-18 DIAGNOSIS — L304 Erythema intertrigo: Secondary | ICD-10-CM | POA: Diagnosis not present

## 2017-08-18 DIAGNOSIS — H40009 Preglaucoma, unspecified, unspecified eye: Secondary | ICD-10-CM | POA: Diagnosis not present

## 2017-08-18 DIAGNOSIS — Z7401 Bed confinement status: Secondary | ICD-10-CM | POA: Diagnosis not present

## 2017-08-18 DIAGNOSIS — S7002XA Contusion of left hip, initial encounter: Secondary | ICD-10-CM | POA: Diagnosis not present

## 2017-08-18 DIAGNOSIS — Z7982 Long term (current) use of aspirin: Secondary | ICD-10-CM | POA: Diagnosis not present

## 2017-08-18 DIAGNOSIS — B952 Enterococcus as the cause of diseases classified elsewhere: Secondary | ICD-10-CM | POA: Diagnosis not present

## 2017-08-18 DIAGNOSIS — W19XXXA Unspecified fall, initial encounter: Secondary | ICD-10-CM | POA: Diagnosis not present

## 2017-08-18 DIAGNOSIS — I10 Essential (primary) hypertension: Secondary | ICD-10-CM | POA: Diagnosis not present

## 2017-08-18 DIAGNOSIS — E119 Type 2 diabetes mellitus without complications: Secondary | ICD-10-CM | POA: Diagnosis not present

## 2017-08-18 DIAGNOSIS — N390 Urinary tract infection, site not specified: Secondary | ICD-10-CM | POA: Diagnosis not present

## 2017-08-18 DIAGNOSIS — R6889 Other general symptoms and signs: Secondary | ICD-10-CM | POA: Diagnosis not present

## 2017-08-18 DIAGNOSIS — R5381 Other malaise: Secondary | ICD-10-CM | POA: Diagnosis not present

## 2017-08-18 DIAGNOSIS — S5002XA Contusion of left elbow, initial encounter: Secondary | ICD-10-CM | POA: Diagnosis not present

## 2017-08-18 DIAGNOSIS — Z7901 Long term (current) use of anticoagulants: Secondary | ICD-10-CM | POA: Diagnosis not present

## 2017-08-18 DIAGNOSIS — S8002XA Contusion of left knee, initial encounter: Secondary | ICD-10-CM | POA: Diagnosis not present

## 2017-08-18 DIAGNOSIS — L89303 Pressure ulcer of unspecified buttock, stage 3: Secondary | ICD-10-CM | POA: Diagnosis not present

## 2017-08-18 DIAGNOSIS — N3000 Acute cystitis without hematuria: Secondary | ICD-10-CM | POA: Diagnosis not present

## 2017-08-18 DIAGNOSIS — Z993 Dependence on wheelchair: Secondary | ICD-10-CM | POA: Diagnosis not present

## 2017-08-18 DIAGNOSIS — K219 Gastro-esophageal reflux disease without esophagitis: Secondary | ICD-10-CM | POA: Diagnosis not present

## 2017-08-18 DIAGNOSIS — Z880 Allergy status to penicillin: Secondary | ICD-10-CM | POA: Diagnosis not present

## 2017-08-18 DIAGNOSIS — E785 Hyperlipidemia, unspecified: Secondary | ICD-10-CM | POA: Diagnosis not present

## 2017-08-18 DIAGNOSIS — L309 Dermatitis, unspecified: Secondary | ICD-10-CM | POA: Diagnosis not present

## 2017-08-18 DIAGNOSIS — G4730 Sleep apnea, unspecified: Secondary | ICD-10-CM | POA: Diagnosis not present

## 2017-08-18 DIAGNOSIS — M6281 Muscle weakness (generalized): Secondary | ICD-10-CM | POA: Diagnosis not present

## 2017-08-18 DIAGNOSIS — Z8673 Personal history of transient ischemic attack (TIA), and cerebral infarction without residual deficits: Secondary | ICD-10-CM | POA: Diagnosis not present

## 2017-08-18 DIAGNOSIS — L89310 Pressure ulcer of right buttock, unstageable: Secondary | ICD-10-CM | POA: Diagnosis not present

## 2017-08-18 DIAGNOSIS — H409 Unspecified glaucoma: Secondary | ICD-10-CM | POA: Diagnosis not present

## 2017-08-18 DIAGNOSIS — Z7984 Long term (current) use of oral hypoglycemic drugs: Secondary | ICD-10-CM | POA: Diagnosis not present

## 2017-08-18 DIAGNOSIS — E039 Hypothyroidism, unspecified: Secondary | ICD-10-CM | POA: Diagnosis not present

## 2017-08-18 DIAGNOSIS — R531 Weakness: Secondary | ICD-10-CM | POA: Diagnosis not present

## 2017-08-18 LAB — GLUCOSE, CAPILLARY
GLUCOSE-CAPILLARY: 189 mg/dL — AB (ref 65–99)
Glucose-Capillary: 257 mg/dL — ABNORMAL HIGH (ref 65–99)

## 2017-08-18 LAB — CREATININE, SERUM
CREATININE: 1.08 mg/dL — AB (ref 0.44–1.00)
GFR calc Af Amer: 52 mL/min — ABNORMAL LOW (ref >60)
GFR, EST NON AFRICAN AMERICAN: 45 mL/min — AB (ref >60)

## 2017-08-18 MED ORDER — CEFUROXIME AXETIL 500 MG PO TABS: 500.0000 mg | ORAL_TABLET | Freq: Two times a day (BID) | ORAL | 0 refills | Status: AC

## 2017-08-18 MED ORDER — FLEET ENEMA 7-19 GM/118ML RE ENEM
1.0000 | ENEMA | Freq: Once | RECTAL | Status: AC
  Administered 2017-08-18: 1 via RECTAL

## 2017-08-18 NOTE — Clinical Social Work Placement (Signed)
   CLINICAL SOCIAL WORK PLACEMENT  NOTE  Date:  08/18/2017  Patient Details  Name: Alexandra KempBetty L Dubray MRN: [id number] Date of Birth: [birth year]  Clinical Social Work is seeking post-discharge placement for this patient at the Skilled  Nursing Facility level of care (*CSW will initial, date and re-position this form in  chart as items are completed):  Yes   Patient/family provided with [facility] Clinical Social Work Department's list of facilities offering this level of care within the geographic area requested by the patient (or if unable, by the patient's family).  Yes   Patient/family informed of their freedom to choose among providers that offer the needed level of care, that participate in Medicare, Medicaid or managed care program needed by the patient, have an available bed and are willing to accept the patient.  Yes   Patient/family informed of [facility]'s ownership interest in [facility] and [facility], as well as of the fact that they are under no obligation to receive care at these facilities.  PASRR submitted to EDS on       PASRR number received on       Existing PASRR number confirmed on 08/15/17     FL2 transmitted to all facilities in geographic area requested by pt/family on 08/16/17     FL2 transmitted to all facilities within larger geographic area on       Patient informed that his/her managed care company has contracts with or will negotiate with certain facilities, including the following:        Yes   Patient/family informed of bed offers received.  Patient chooses bed at  [facility]     Physician recommends and patient chooses bed at  [facility](SNF)    Patient to be transferred to  [facility] on 08/18/17.  Patient to be transferred to facility by  (EMS)     Patient family notified on 08/18/17 of transfer.  Name of family member notified:   (Message Left for Demita Amich)     PHYSICIAN       Additional Comment:     _______________________________________________ York SpanielMonica Keionna Kinnaird, LCSW 08/18/2017, 10:02 AM

## 2017-08-18 NOTE — Progress Notes (Signed)
Chaplain rounding unit visited with pt. Pt states she is worried that her son has not come or spoken to her since yesterday. Pt was also worried that her niece had not come to see her this morning and felt alone. CH encouraged pt, provided company, and while talking to pt, pt's son called to inform pt that he was on his way to see her. Pt was relieved and felt calm. CH provided a spiritual support and a ministry of presence. Pt states she will be transfered to a rehab facility this afternoon.    08/18/17 1100  Clinical Encounter Type  Visited With Patient and family together  Visit Type Initial;Follow-up;Spiritual support;Other (Comment)  Referral From Chaplain  Consult/Referral To Chaplain  Spiritual Encounters  Spiritual Needs Emotional;Other (Comment)

## 2017-08-18 NOTE — Clinical Social Work Note (Signed)
Patient's son decided to call hospital and CSW has updated him regarding change in plans. York SpanielMonica Daejah Klebba MSW,LCSW [phone]

## 2017-08-18 NOTE — Clinical Social Work Note (Signed)
CSW has been informed by Alexandra English, the [facility] Coordinator, that she is now going to be rescinding the bed offer made and will not be taking patient. Patient has now accepted a bed at [facility] and will be going to [facility] today. Patient's niece is visiting with patient and is aware of the change. York SpanielMonica Yolonda English MSW,LCSW [phone]

## 2017-08-18 NOTE — Discharge Summary (Signed)
[facility] NAME: Alexandra English    MR#:  [id number]  DATE OF BIRTH:  [birth year]  DATE OF ADMISSION:  08/14/2017 ADMITTING PHYSICIAN: Nicholes Mango, MD  DATE OF DISCHARGE: 08/18/2017  PRIMARY CARE PHYSICIAN: Burnard Hawthorne, FNP    ADMISSION DIAGNOSIS:  Weakness [R53.1] Acute cystitis without hematuria [N30.00] Contusion of left elbow, initial encounter [S50.02XA] Contusion of left hip, initial encounter [S70.02XA] Contusion of left knee, initial encounter [S80.02XA] Fall, initial encounter [W19.XXXA] Strain of lumbar region, initial encounter [V74.827M]  DISCHARGE DIAGNOSIS:  Active Problems:   Acute cystitis   Fall   SECONDARY DIAGNOSIS:   Past Medical History:  Diagnosis Date  . Diabetes mellitus 04/2009   HgbA1c 7.1% 07/2009, Monofilament normal, Ortho pending  . Fracture    of rib  . Glaucoma    Dr. Thomasene Ripple  . Hypertension   . Sleep apnea    Per Pt. was supposed to start CPAP, however reluctant to try  . Stroke [facility])    Left frontal4/2010, Left parietal and right parietal 04/2011 , Started on Plavix and then changed to Coumadin by recommendation of Dr. Carlis Abbott  . Thyroid disease    hypothyroidism    HOSPITAL COURSE:  83 year old female with history of diabetes and hypothyroidism who is chronically bedbound presented after a fall and found to have UTI and complaining of generalized weakness.  1. Generalized weakness due to urinary tract infection and underlying chronic deconditioned state Urine culture was positive for Escherichia coli. She was transitioned from IV antibiotics to oral Ceftin. She will complete course of therapy with oral anti-medics.   PT consultation is recommending skilled nursing facility discharge and patient is now agreeable.   2. Urinary tract infection: Plan as outlined above  3. Essential hypertension: Continue clonidine and amlodipine  4. Hyperlipidemia: Continue statin  5.  Diabetes: Patient will resume ADA diet with outpatient regimen 6. Hypothyroidism: Continue Synthroid  7. :Moisture associated skin damage (MASD) And pressure to right buttock, present on admission. Intertriginous dermatitis (fungal overgrowth to subpannicular fold): Cleanse right buttocks with soap and water. Gerhardt barrier cream twice daily. Avoid use of disposable briefs and underpads (has urinary diversion device in place) Interdry Ag to abdominal pannus:  Measure and cut length of InterDry Ag+to fit in skin folds that have skin breakdown Tuck InterDry Ag+fabric into skin folds in a single layer, allow for 2 inches of overhang from skin edges to allow for wicking to occur May remove to bathe; dry area thoroughly and then tuck into affected areas again Do not apply any creams or ointments when using InterDry Ag+ DO NOT THROW AWAY FOR 5 DAYS unless soiled with stool DO NOT [place] product, this will inactivate the silver in the material  New sheet of Interdry Ag+ should be applied after 5 days of use if patient continues to have skin breakdown      DISCHARGE CONDITIONS AND DIET:   Stable for discharge on diabetic diet  CONSULTS OBTAINED:    DRUG ALLERGIES:   Allergies  Allergen Reactions  . Beta Adrenergic Blockers   . Cephalosporins     Taken omnicef without a problem. MGA 07/2017  . Lisinopril   . Macrobid [Nitrofurantoin Macrocrystal]   . Toprol Xl [Metoprolol Succinate]   . Penicillins Swelling and Rash    Has patient had a PCN reaction causing immediate rash, facial/tongue/throat swelling, SOB or lightheadedness with hypotension: Yes Has patient had a PCN reaction causing severe rash involving  mucus membranes or skin necrosis: Unknown Has patient had a PCN reaction that required hospitalization: No Has patient had a PCN reaction occurring within the last 10 years: No If all of the above answers are "NO", then may proceed with Cephalosporin use.    DISCHARGE  MEDICATIONS:   Current Discharge Medication List    START taking these medications   Details  cefUROXime (CEFTIN) 500 MG tablet Take 1 tablet (500 mg total) by mouth 2 (two) times daily with a meal. Qty: 6 tablet, Refills: 0      CONTINUE these medications which have NOT CHANGED   Details  amLODipine (NORVASC) 5 MG tablet Take 1 tablet (5 mg total) by mouth daily. Qty: 90 tablet, Refills: 3   Associated Diagnoses: Essential hypertension    aspirin 81 MG tablet Take 81 mg by mouth daily.      atorvastatin (LIPITOR) 40 MG tablet Take 1 tablet (40 mg total) by mouth daily. Qty: 90 tablet, Refills: 3   Associated Diagnoses: Hyperlipidemia, unspecified hyperlipidemia type    cloNIDine (CATAPRES) 0.2 MG tablet Take 1 tablet (0.2 mg total) by mouth at bedtime. Qty: 90 tablet, Refills: 3    glipiZIDE (GLUCOTROL) 5 MG tablet TAKE 1 TABLET BY MOUTH TWICE DAILY BEFORE A MEAL Qty: 60 tablet, Refills: 2    levothyroxine (SYNTHROID, LEVOTHROID) 75 MCG tablet Take 1 tablet (75 mcg total) by mouth daily. Qty: 90 tablet, Refills: 3    mirtazapine (REMERON) 30 MG tablet TAKE ONE TABLET BY MOUTH AT BEDTIME Qty: 30 tablet, Refills: 3   Associated Diagnoses: Anxiety and depression    omeprazole (PRILOSEC) 20 MG capsule Take 1 capsule (20 mg total) by mouth daily. Qty: 30 capsule, Refills: 1   Associated Diagnoses: Gastroesophageal reflux disease, esophagitis presence not specified    sitaGLIPtin (JANUVIA) 25 MG tablet Take 1 tablet (25 mg total) by mouth daily. Qty: 90 tablet, Refills: 1    Blood Glucose Monitoring Suppl (ONE TOUCH ULTRA SYSTEM KIT) W/DEVICE KIT 1 kit by Does not apply route once. Qty: 1 each, Refills: 0    glucose blood (ONE TOUCH ULTRA TEST) test strip USE ONE STRIP TO CHECK GLUCOSE TWICE DAILY E11.8 Qty: 100 each, Refills: 2    Lancets (ONETOUCH ULTRASOFT) lancets Use as instructed Qty: 100 each, Refills: 3      STOP taking these medications     cefdinir (OMNICEF)  300 MG capsule      ranitidine (ZANTAC) 150 MG tablet           Today   CHIEF COMPLAINT:   Doing well this morning. Son apparently reported that he was unable to take care of his mom and has recommended that the patient be discharged to skilled nursing facility. Patient is agreeable to this discharge plan.   VITAL SIGNS:  Blood pressure (!) 149/53, pulse 71, temperature 97.7 F (36.5 C), temperature source Oral, resp. rate 18, height _0  (1.6 m), weight 86.2 kg (190 lb), SpO2 95 %.   REVIEW OF SYSTEMS:  Review of Systems  Constitutional: Negative for chills, fever and malaise/fatigue.  HENT: Negative.  Negative for ear discharge, ear pain, hearing loss, nosebleeds and sore throat.   Eyes: Negative.  Negative for blurred vision and pain.  Respiratory: Negative.  Negative for cough, hemoptysis, shortness of breath and wheezing.   Cardiovascular: Negative.  Negative for chest pain, palpitations and leg swelling.  Gastrointestinal: Negative.  Negative for abdominal pain, blood in stool, diarrhea, nausea and vomiting.  Genitourinary: Negative.  Negative for dysuria.  Musculoskeletal: Negative.  Negative for back pain.  Skin: Negative.   Neurological: Positive for weakness. Negative for dizziness, tremors, speech change, focal weakness, seizures and headaches.  Endo/Heme/Allergies: Negative.  Does not bruise/bleed easily.  Psychiatric/Behavioral: Negative.  Negative for depression, hallucinations and suicidal ideas.     PHYSICAL EXAMINATION:  GENERAL:  83 y.o.-year-old patient lying in the bed with no acute distress. Obese NECK:  Supple, no jugular venous distention. No thyroid enlargement, no tenderness.  LUNGS: Normal breath sounds bilaterally, no wheezing, rales,rhonchi  No use of accessory muscles of respiration.  CARDIOVASCULAR: S1, S2 normal. No murmurs, rubs, or gallops.  ABDOMEN: Soft, non-tender, non-distended. Bowel sounds present. No organomegaly or mass.   EXTREMITIES: No pedal edema, cyanosis, or clubbing.  PSYCHIATRIC: The patient is alert and oriented x 3.  SKIN: No obvious rash, lesion, or ulcer.   DATA REVIEW:   CBC  Recent Labs Lab 08/15/17 0327  WBC 12.7*  HGB 12.7  HCT 37.4  PLT 242    Chemistries   Recent Labs Lab 08/15/17 0327 08/18/17 0248  NA 140  --   K 3.6  --   CL 108  --   CO2 24  --   GLUCOSE 173*  --   BUN 10  --   CREATININE 0.79 1.08*  CALCIUM 8.7*  --   AST 19  --   ALT 14  --   ALKPHOS 99  --   BILITOT 0.7  --     Cardiac Enzymes No results for input(s): TROPONINI in the last 168 hours.  Microbiology Results  _0 @  RADIOLOGY:  Dg Foot Complete Right  Result Date: 08/16/2017 CLINICAL DATA:  83 year old who fell 2 days ago and injured the right foot. Persistent pain. EXAM: RIGHT FOOT COMPLETE - 3+ VIEW COMPARISON:  05/24/2014, 12/27/2011. FINDINGS: Severe osseous demineralization. No evidence of acute fracture or dislocation. Hammertoe deformity. Narrowing of the IP joint spaces of the toes. Remaining joint spaces well-preserved. Flattening of the head of the second metatarsal, unchanged. Large plantar calcaneal spur. IMPRESSION: 1. No acute osseous abnormality. 2. Osseous demineralization. Electronically Signed   By: Evangeline Dakin M.D.   On: 08/16/2017 15:03      Current Discharge Medication List    START taking these medications   Details  cefUROXime (CEFTIN) 500 MG tablet Take 1 tablet (500 mg total) by mouth 2 (two) times daily with a meal. Qty: 6 tablet, Refills: 0      CONTINUE these medications which have NOT CHANGED   Details  amLODipine (NORVASC) 5 MG tablet Take 1 tablet (5 mg total) by mouth daily. Qty: 90 tablet, Refills: 3   Associated Diagnoses: Essential hypertension    aspirin 81 MG tablet Take 81 mg by mouth daily.      atorvastatin (LIPITOR) 40 MG tablet Take 1 tablet (40 mg total) by mouth daily. Qty: 90 tablet, Refills: 3   Associated Diagnoses:  Hyperlipidemia, unspecified hyperlipidemia type    cloNIDine (CATAPRES) 0.2 MG tablet Take 1 tablet (0.2 mg total) by mouth at bedtime. Qty: 90 tablet, Refills: 3    glipiZIDE (GLUCOTROL) 5 MG tablet TAKE 1 TABLET BY MOUTH TWICE DAILY BEFORE A MEAL Qty: 60 tablet, Refills: 2    levothyroxine (SYNTHROID, LEVOTHROID) 75 MCG tablet Take 1 tablet (75 mcg total) by mouth daily. Qty: 90 tablet, Refills: 3    mirtazapine (REMERON) 30 MG tablet TAKE ONE TABLET BY MOUTH AT BEDTIME Qty: 30 tablet, Refills: 3  Associated Diagnoses: Anxiety and depression    omeprazole (PRILOSEC) 20 MG capsule Take 1 capsule (20 mg total) by mouth daily. Qty: 30 capsule, Refills: 1   Associated Diagnoses: Gastroesophageal reflux disease, esophagitis presence not specified    sitaGLIPtin (JANUVIA) 25 MG tablet Take 1 tablet (25 mg total) by mouth daily. Qty: 90 tablet, Refills: 1    Blood Glucose Monitoring Suppl (ONE TOUCH ULTRA SYSTEM KIT) W/DEVICE KIT 1 kit by Does not apply route once. Qty: 1 each, Refills: 0    glucose blood (ONE TOUCH ULTRA TEST) test strip USE ONE STRIP TO CHECK GLUCOSE TWICE DAILY E11.8 Qty: 100 each, Refills: 2    Lancets (ONETOUCH ULTRASOFT) lancets Use as instructed Qty: 100 each, Refills: 3      STOP taking these medications     cefdinir (OMNICEF) 300 MG capsule      ranitidine (ZANTAC) 150 MG tablet          Management plans discussed with the patient and she is in agreement. Stable for discharge snf  Patient should follow up with pcp  CODE STATUS:     Code Status Orders        Start     Ordered   08/14/17 1954  Full code  Continuous     08/14/17 1953    Code Status History    Date Active Date Inactive Code Status Order ID Comments User Context   10/19/2016  7:02 AM 10/20/2016  5:23 PM Full Code [id number]  Harrie Foreman, MD Inpatient    Advance Directive Documentation     Most Recent Value  Type of Advance Directive  Healthcare Power of Attorney   Pre-existing out of facility DNR order (yellow form or pink MOST form)  -  "MOST" Form in Place?  -      TOTAL TIME TAKING CARE OF THIS PATIENT: 38 minutes.    Note: This dictation was prepared with Dragon dictation along with smaller phrase technology. Any transcriptional errors that result from this process are unintentional.  Chara Marquard M.D on 08/18/2017 at 9:25 AM  Between 7am to 6pm - Pager - [phone] After 6pm go to [email or web address] - password [email or web address] [facility]  Office  [phone]  CC: Primary care physician; Burnard Hawthorne, FNP

## 2017-08-18 NOTE — Clinical Social Work Note (Signed)
CSW informed by MD that patient is going to discharge today and that her son has stated she will need to go to STR. It has been difficult for anyone to reach patient's son and after many attempts RN CM and CSW have been unsuccessful. Patient is aware. Patient has informed CSW that [facility]. CSW has notified [facility] and sent discharge information. York SpanielMonica Lucilla English MSW,LCSW [phone]

## 2017-08-19 DIAGNOSIS — N390 Urinary tract infection, site not specified: Secondary | ICD-10-CM | POA: Diagnosis not present

## 2017-08-19 DIAGNOSIS — R5381 Other malaise: Secondary | ICD-10-CM | POA: Diagnosis not present

## 2017-08-19 DIAGNOSIS — E119 Type 2 diabetes mellitus without complications: Secondary | ICD-10-CM | POA: Diagnosis not present

## 2017-08-19 DIAGNOSIS — B9620 Unspecified Escherichia coli [E. coli] as the cause of diseases classified elsewhere: Secondary | ICD-10-CM | POA: Diagnosis not present

## 2017-08-21 ENCOUNTER — Telehealth: Payer: Self-pay | Admitting: Family

## 2017-08-21 NOTE — Telephone Encounter (Signed)
[facility] will continue to FU for TCM from Rehab to home. FYI

## 2017-09-07 DIAGNOSIS — M47817 Spondylosis without myelopathy or radiculopathy, lumbosacral region: Secondary | ICD-10-CM | POA: Diagnosis not present

## 2017-09-07 DIAGNOSIS — I10 Essential (primary) hypertension: Secondary | ICD-10-CM | POA: Diagnosis not present

## 2017-09-07 DIAGNOSIS — B9620 Unspecified Escherichia coli [E. coli] as the cause of diseases classified elsewhere: Secondary | ICD-10-CM | POA: Diagnosis not present

## 2017-09-07 DIAGNOSIS — E1142 Type 2 diabetes mellitus with diabetic polyneuropathy: Secondary | ICD-10-CM | POA: Diagnosis not present

## 2017-09-07 DIAGNOSIS — N3000 Acute cystitis without hematuria: Secondary | ICD-10-CM | POA: Diagnosis not present

## 2017-09-08 ENCOUNTER — Telehealth: Payer: Self-pay | Admitting: *Deleted

## 2017-09-08 DIAGNOSIS — E1142 Type 2 diabetes mellitus with diabetic polyneuropathy: Secondary | ICD-10-CM | POA: Diagnosis not present

## 2017-09-08 DIAGNOSIS — M47817 Spondylosis without myelopathy or radiculopathy, lumbosacral region: Secondary | ICD-10-CM | POA: Diagnosis not present

## 2017-09-08 DIAGNOSIS — N3000 Acute cystitis without hematuria: Secondary | ICD-10-CM | POA: Diagnosis not present

## 2017-09-08 DIAGNOSIS — I10 Essential (primary) hypertension: Secondary | ICD-10-CM | POA: Diagnosis not present

## 2017-09-08 DIAGNOSIS — B9620 Unspecified Escherichia coli [E. coli] as the cause of diseases classified elsewhere: Secondary | ICD-10-CM | POA: Diagnosis not present

## 2017-09-08 NOTE — Telephone Encounter (Signed)
Sreshta from [facility] requested a call in refrence to pt medication , and orders being signed for services. Contact [phone]

## 2017-09-09 ENCOUNTER — Telehealth: Payer: Self-pay | Admitting: Family

## 2017-09-09 DIAGNOSIS — R2681 Unsteadiness on feet: Secondary | ICD-10-CM

## 2017-09-09 NOTE — Telephone Encounter (Signed)
Lemuel from [facility] called and stated that he saw patient yesterday for pt evaluation. Pt is in a assisted living facility on the second floor. Pt has poor ambulation and is limited. She can only walk short distances. Pt will benefit from pt he is requesting 2x1, 1x3 for a total of 5 vivist. Clayton Lefort also states that pt has a power wheelchair that is broken and they are trying to determine what company to use to fix I, so in the meantime he is asking for a prescription for a manual wheelchair be faxed to [facility] fax [phone].   Call [facility] @ [phone]

## 2017-09-09 NOTE — Telephone Encounter (Signed)
Order has been placed and placed in provider box to be signed.

## 2017-09-09 NOTE — Telephone Encounter (Signed)
Please fax rx for wheelchair- happy to sign  This is a dme

## 2017-09-10 ENCOUNTER — Telehealth: Payer: Self-pay | Admitting: Family

## 2017-09-10 DIAGNOSIS — B9620 Unspecified Escherichia coli [E. coli] as the cause of diseases classified elsewhere: Secondary | ICD-10-CM | POA: Diagnosis not present

## 2017-09-10 DIAGNOSIS — E1142 Type 2 diabetes mellitus with diabetic polyneuropathy: Secondary | ICD-10-CM | POA: Diagnosis not present

## 2017-09-10 DIAGNOSIS — N3000 Acute cystitis without hematuria: Secondary | ICD-10-CM | POA: Diagnosis not present

## 2017-09-10 DIAGNOSIS — M47817 Spondylosis without myelopathy or radiculopathy, lumbosacral region: Secondary | ICD-10-CM | POA: Diagnosis not present

## 2017-09-10 DIAGNOSIS — I10 Essential (primary) hypertension: Secondary | ICD-10-CM | POA: Diagnosis not present

## 2017-09-10 NOTE — Telephone Encounter (Signed)
Ruta Hinds called and stated that pt is having continuing burning when urinating and a foul smell.Also wanted to mention that pt has not been taking lasix since she has been released from hospital. Please advise, thank you!  Call pt @ [phone]

## 2017-09-10 NOTE — Telephone Encounter (Signed)
Ordered has been [facility] to [facility].

## 2017-09-11 NOTE — Telephone Encounter (Signed)
Informed jesse that patient needs to be evaluated.  Patient was not given Lasix.

## 2017-09-11 NOTE — Telephone Encounter (Signed)
Margaret's patient

## 2017-09-11 NOTE — Telephone Encounter (Signed)
Informed [facility] that she does not take lasix

## 2017-09-12 DIAGNOSIS — N3000 Acute cystitis without hematuria: Secondary | ICD-10-CM | POA: Diagnosis not present

## 2017-09-12 DIAGNOSIS — E1142 Type 2 diabetes mellitus with diabetic polyneuropathy: Secondary | ICD-10-CM | POA: Diagnosis not present

## 2017-09-12 DIAGNOSIS — B9620 Unspecified Escherichia coli [E. coli] as the cause of diseases classified elsewhere: Secondary | ICD-10-CM | POA: Diagnosis not present

## 2017-09-12 DIAGNOSIS — M47817 Spondylosis without myelopathy or radiculopathy, lumbosacral region: Secondary | ICD-10-CM | POA: Diagnosis not present

## 2017-09-12 DIAGNOSIS — I10 Essential (primary) hypertension: Secondary | ICD-10-CM | POA: Diagnosis not present

## 2017-09-16 DIAGNOSIS — B9620 Unspecified Escherichia coli [E. coli] as the cause of diseases classified elsewhere: Secondary | ICD-10-CM | POA: Diagnosis not present

## 2017-09-16 DIAGNOSIS — N3000 Acute cystitis without hematuria: Secondary | ICD-10-CM | POA: Diagnosis not present

## 2017-09-16 DIAGNOSIS — I10 Essential (primary) hypertension: Secondary | ICD-10-CM | POA: Diagnosis not present

## 2017-09-16 DIAGNOSIS — E1142 Type 2 diabetes mellitus with diabetic polyneuropathy: Secondary | ICD-10-CM | POA: Diagnosis not present

## 2017-09-16 DIAGNOSIS — M47817 Spondylosis without myelopathy or radiculopathy, lumbosacral region: Secondary | ICD-10-CM | POA: Diagnosis not present

## 2017-09-17 ENCOUNTER — Telehealth: Payer: Self-pay | Admitting: Family

## 2017-09-17 DIAGNOSIS — N3000 Acute cystitis without hematuria: Secondary | ICD-10-CM | POA: Diagnosis not present

## 2017-09-17 DIAGNOSIS — E1142 Type 2 diabetes mellitus with diabetic polyneuropathy: Secondary | ICD-10-CM | POA: Diagnosis not present

## 2017-09-17 DIAGNOSIS — B9620 Unspecified Escherichia coli [E. coli] as the cause of diseases classified elsewhere: Secondary | ICD-10-CM | POA: Diagnosis not present

## 2017-09-17 DIAGNOSIS — M47817 Spondylosis without myelopathy or radiculopathy, lumbosacral region: Secondary | ICD-10-CM | POA: Diagnosis not present

## 2017-09-17 DIAGNOSIS — I10 Essential (primary) hypertension: Secondary | ICD-10-CM | POA: Diagnosis not present

## 2017-09-17 NOTE — Telephone Encounter (Signed)
[name] [phone] called from [facility] regarding pt is still waiting for her power chair which will take two weeks to be repaired until then she cannot come to the office for appt she still is having mild burning. Pt finished antibiotic and still has some redness in both lower legs. Thank you!

## 2017-09-17 NOTE — Telephone Encounter (Signed)
Please advise 

## 2017-09-18 ENCOUNTER — Other Ambulatory Visit: Payer: Self-pay | Admitting: Family

## 2017-09-18 DIAGNOSIS — F32A Depression, unspecified: Secondary | ICD-10-CM

## 2017-09-18 DIAGNOSIS — F329 Major depressive disorder, single episode, unspecified: Secondary | ICD-10-CM

## 2017-09-18 DIAGNOSIS — F419 Anxiety disorder, unspecified: Principal | ICD-10-CM

## 2017-09-19 NOTE — Telephone Encounter (Signed)
Please advise for refills, thanks 

## 2017-09-22 ENCOUNTER — Other Ambulatory Visit: Payer: Self-pay | Admitting: Family

## 2017-09-22 DIAGNOSIS — F419 Anxiety disorder, unspecified: Principal | ICD-10-CM

## 2017-09-22 DIAGNOSIS — F329 Major depressive disorder, single episode, unspecified: Secondary | ICD-10-CM

## 2017-09-24 ENCOUNTER — Telehealth: Payer: Self-pay | Admitting: Family

## 2017-09-24 DIAGNOSIS — N3000 Acute cystitis without hematuria: Secondary | ICD-10-CM | POA: Diagnosis not present

## 2017-09-24 DIAGNOSIS — E1142 Type 2 diabetes mellitus with diabetic polyneuropathy: Secondary | ICD-10-CM | POA: Diagnosis not present

## 2017-09-24 DIAGNOSIS — B9620 Unspecified Escherichia coli [E. coli] as the cause of diseases classified elsewhere: Secondary | ICD-10-CM | POA: Diagnosis not present

## 2017-09-24 DIAGNOSIS — I10 Essential (primary) hypertension: Secondary | ICD-10-CM | POA: Diagnosis not present

## 2017-09-24 DIAGNOSIS — M47817 Spondylosis without myelopathy or radiculopathy, lumbosacral region: Secondary | ICD-10-CM | POA: Diagnosis not present

## 2017-09-24 NOTE — Telephone Encounter (Signed)
Alexandra English from [facility] called and wanted to know if pt needs to continue taking potassium, as she is no longer on the lasix. Also it is going to be about another week until her power wheelchair is fixed. Blood pressure today was 136/76. Please advise, thank you!  Call [facility] @ [phone]

## 2017-09-24 NOTE — Telephone Encounter (Signed)
She may stop potassium.   Please ensure f/u with [place].

## 2017-09-24 NOTE — Telephone Encounter (Signed)
Please advise 

## 2017-09-25 DIAGNOSIS — E1142 Type 2 diabetes mellitus with diabetic polyneuropathy: Secondary | ICD-10-CM | POA: Diagnosis not present

## 2017-09-25 DIAGNOSIS — B9620 Unspecified Escherichia coli [E. coli] as the cause of diseases classified elsewhere: Secondary | ICD-10-CM | POA: Diagnosis not present

## 2017-09-25 DIAGNOSIS — N3000 Acute cystitis without hematuria: Secondary | ICD-10-CM | POA: Diagnosis not present

## 2017-09-25 DIAGNOSIS — M47817 Spondylosis without myelopathy or radiculopathy, lumbosacral region: Secondary | ICD-10-CM | POA: Diagnosis not present

## 2017-09-25 DIAGNOSIS — I10 Essential (primary) hypertension: Secondary | ICD-10-CM | POA: Diagnosis not present

## 2017-09-25 NOTE — Telephone Encounter (Signed)
Left message for Alexandra English to return call back.

## 2017-09-25 NOTE — Telephone Encounter (Signed)
Alexandra English has been informed of the below.

## 2017-09-29 ENCOUNTER — Telehealth: Payer: Self-pay | Admitting: Family

## 2017-09-29 NOTE — Telephone Encounter (Signed)
Pt called about wanting to have her medication of sitaGLIPtin (JANUVIA) 25 MG tablet at a lower price pt states it's to high. Please advise?  Call pt @ [phone]. Thank you!

## 2017-09-30 NOTE — Telephone Encounter (Signed)
Mal Amabile she will need an appt with [facility], can you please schedule, thanks

## 2017-09-30 NOTE — Telephone Encounter (Signed)
Any coupons you could advise the patient about?   If no good options,  Please set her up an appt with Devota Pace, pharmacist

## 2017-09-30 NOTE — Telephone Encounter (Signed)
Left message for patient to return call back.  

## 2017-10-01 ENCOUNTER — Telehealth: Payer: Self-pay | Admitting: Family

## 2017-10-01 DIAGNOSIS — I10 Essential (primary) hypertension: Secondary | ICD-10-CM | POA: Diagnosis not present

## 2017-10-01 DIAGNOSIS — N3000 Acute cystitis without hematuria: Secondary | ICD-10-CM | POA: Diagnosis not present

## 2017-10-01 DIAGNOSIS — B9620 Unspecified Escherichia coli [E. coli] as the cause of diseases classified elsewhere: Secondary | ICD-10-CM | POA: Diagnosis not present

## 2017-10-01 DIAGNOSIS — E1142 Type 2 diabetes mellitus with diabetic polyneuropathy: Secondary | ICD-10-CM | POA: Diagnosis not present

## 2017-10-01 DIAGNOSIS — M47817 Spondylosis without myelopathy or radiculopathy, lumbosacral region: Secondary | ICD-10-CM | POA: Diagnosis not present

## 2017-10-01 NOTE — Telephone Encounter (Signed)
Patient ran out of medication for 2 days and did not have any extra to take. Her daughter is picking up her prescription this afternoon. Spoke with patient and she denies having any kind of cp, sob, palpitations, etc. She says she does not feel bad, the only way she knew her BP was high is because the nurse from [facility] checked it. The nurse told her that he may be back this week to recheck BP since it was so high today.

## 2017-10-01 NOTE — Telephone Encounter (Signed)
Call pt and get more info.  Did she run out of medication ??   Does she have any symptoms - CP, sob, palpitations etc?   Any symptoms with a BP that high she needs to go to Ed.  If no symptoms and she just hasn't taken Medication, please advise to take ASAP and monitor BP. If doesn't come down or she developed symptoms, again she would need to go to ED or call 911 for concern for stroke, MI

## 2017-10-01 NOTE — Telephone Encounter (Signed)
Alexandra CumminsJesse [phone] called from [facility] regarding pt was going to be discharged today but her BP is 178/84. He wants to know if pt can be discharged or Pt ran out of BP medication daughter is getting it refilled today to reassess after BP medication? Pt is still not gotten her power chair fix so she can not come in for office visit. Thank you!

## 2017-10-02 NOTE — Telephone Encounter (Signed)
Thank you trisha.   Please ensure we hear from her today

## 2017-10-02 NOTE — Telephone Encounter (Signed)
Spoke with patient today, she was not out of BP medication. She was out of Januvia for her blood sugar. It is going to be picked up this afternoon. I called [facility] and spoke with pharmacist to be sure the medication had been refilled and that patient had been picking up medication like she was supposed to. She has 2 prescriptions ready at the moment and patient says that someone is picking them up for her today. She is still not having any symptoms.  Just an FYI for you

## 2017-10-02 NOTE — Telephone Encounter (Signed)
Patient stated she can not schedule appointment due to her chair not working. When she gets it fixed then she will schedule as per patient.

## 2017-10-03 NOTE — Telephone Encounter (Signed)
Noted.  Thank you  Advise pt to let us know BPs as she gets back on medication. May take a few days to normalize  Again, the key is any symptoms ( CP, HA, vision changes etc) , she needs to go to ed

## 2017-10-07 ENCOUNTER — Ambulatory Visit: Payer: Medicare Other | Admitting: Family

## 2017-10-07 NOTE — Telephone Encounter (Signed)
Can we circle back with pt?  How are  Bps?

## 2017-10-07 NOTE — Telephone Encounter (Signed)
Attempted to reach patient. Phone kept ringing. Will try again. 

## 2017-10-13 ENCOUNTER — Telehealth: Payer: Self-pay

## 2017-10-13 ENCOUNTER — Observation Stay
Admission: EM | Admit: 2017-10-13 | Discharge: 2017-10-15 | Disposition: A | Discharge status: Home / Self Care | Payer: Medicare Other | Attending: Internal Medicine | Admitting: Internal Medicine

## 2017-10-13 ENCOUNTER — Telehealth: Payer: Self-pay | Admitting: Family

## 2017-10-13 DIAGNOSIS — R001 Bradycardia, unspecified: Secondary | ICD-10-CM | POA: Insufficient documentation

## 2017-10-13 DIAGNOSIS — R5383 Other fatigue: Secondary | ICD-10-CM | POA: Diagnosis not present

## 2017-10-13 DIAGNOSIS — Z881 Allergy status to other antibiotic agents status: Secondary | ICD-10-CM | POA: Diagnosis not present

## 2017-10-13 DIAGNOSIS — E1139 Type 2 diabetes mellitus with other diabetic ophthalmic complication: Secondary | ICD-10-CM | POA: Insufficient documentation

## 2017-10-13 DIAGNOSIS — F419 Anxiety disorder, unspecified: Secondary | ICD-10-CM | POA: Insufficient documentation

## 2017-10-13 DIAGNOSIS — Z7984 Long term (current) use of oral hypoglycemic drugs: Secondary | ICD-10-CM | POA: Insufficient documentation

## 2017-10-13 DIAGNOSIS — R1030 Lower abdominal pain, unspecified: Secondary | ICD-10-CM | POA: Insufficient documentation

## 2017-10-13 DIAGNOSIS — E785 Hyperlipidemia, unspecified: Secondary | ICD-10-CM | POA: Diagnosis not present

## 2017-10-13 DIAGNOSIS — Z7901 Long term (current) use of anticoagulants: Secondary | ICD-10-CM | POA: Insufficient documentation

## 2017-10-13 DIAGNOSIS — F329 Major depressive disorder, single episode, unspecified: Secondary | ICD-10-CM | POA: Insufficient documentation

## 2017-10-13 DIAGNOSIS — N3000 Acute cystitis without hematuria: Secondary | ICD-10-CM | POA: Diagnosis not present

## 2017-10-13 DIAGNOSIS — G9340 Encephalopathy, unspecified: Secondary | ICD-10-CM | POA: Diagnosis not present

## 2017-10-13 DIAGNOSIS — Z96611 Presence of right artificial shoulder joint: Secondary | ICD-10-CM | POA: Insufficient documentation

## 2017-10-13 DIAGNOSIS — K219 Gastro-esophageal reflux disease without esophagitis: Secondary | ICD-10-CM | POA: Insufficient documentation

## 2017-10-13 DIAGNOSIS — Z23 Encounter for immunization: Secondary | ICD-10-CM | POA: Insufficient documentation

## 2017-10-13 DIAGNOSIS — R319 Hematuria, unspecified: Secondary | ICD-10-CM

## 2017-10-13 DIAGNOSIS — G4730 Sleep apnea, unspecified: Secondary | ICD-10-CM | POA: Insufficient documentation

## 2017-10-13 DIAGNOSIS — R739 Hyperglycemia, unspecified: Secondary | ICD-10-CM | POA: Diagnosis not present

## 2017-10-13 DIAGNOSIS — E1142 Type 2 diabetes mellitus with diabetic polyneuropathy: Secondary | ICD-10-CM | POA: Diagnosis not present

## 2017-10-13 DIAGNOSIS — I10 Essential (primary) hypertension: Secondary | ICD-10-CM | POA: Insufficient documentation

## 2017-10-13 DIAGNOSIS — Z880 Allergy status to penicillin: Secondary | ICD-10-CM | POA: Diagnosis not present

## 2017-10-13 DIAGNOSIS — H42 Glaucoma in diseases classified elsewhere: Secondary | ICD-10-CM | POA: Diagnosis not present

## 2017-10-13 DIAGNOSIS — N390 Urinary tract infection, site not specified: Secondary | ICD-10-CM | POA: Diagnosis not present

## 2017-10-13 DIAGNOSIS — Z8249 Family history of ischemic heart disease and other diseases of the circulatory system: Secondary | ICD-10-CM | POA: Insufficient documentation

## 2017-10-13 DIAGNOSIS — L8990 Pressure ulcer of unspecified site, unspecified stage: Secondary | ICD-10-CM | POA: Insufficient documentation

## 2017-10-13 DIAGNOSIS — Z888 Allergy status to other drugs, medicaments and biological substances status: Secondary | ICD-10-CM | POA: Insufficient documentation

## 2017-10-13 DIAGNOSIS — Z7982 Long term (current) use of aspirin: Secondary | ICD-10-CM | POA: Diagnosis not present

## 2017-10-13 DIAGNOSIS — E039 Hypothyroidism, unspecified: Secondary | ICD-10-CM | POA: Insufficient documentation

## 2017-10-13 DIAGNOSIS — Z90710 Acquired absence of both cervix and uterus: Secondary | ICD-10-CM | POA: Insufficient documentation

## 2017-10-13 DIAGNOSIS — Z8673 Personal history of transient ischemic attack (TIA), and cerebral infarction without residual deficits: Secondary | ICD-10-CM | POA: Insufficient documentation

## 2017-10-13 DIAGNOSIS — R51 Headache: Secondary | ICD-10-CM | POA: Diagnosis not present

## 2017-10-13 DIAGNOSIS — E1165 Type 2 diabetes mellitus with hyperglycemia: Secondary | ICD-10-CM | POA: Diagnosis not present

## 2017-10-13 DIAGNOSIS — Z79899 Other long term (current) drug therapy: Secondary | ICD-10-CM | POA: Insufficient documentation

## 2017-10-13 LAB — CREATININE, SERUM
Creatinine, Ser: 0.76 mg/dL (ref 0.44–1.00)
GFR calc Af Amer: >60 mL/min (ref >60)
GFR calc non Af Amer: >60 mL/min (ref >60)

## 2017-10-13 LAB — URINALYSIS, COMPLETE (UACMP) WITH MICROSCOPIC
Bilirubin Urine: NEGATIVE
GLUCOSE, UA: 150 mg/dL — AB
HGB URINE DIPSTICK: NEGATIVE
Ketones, ur: NEGATIVE mg/dL
NITRITE: NEGATIVE
PROTEIN: NEGATIVE mg/dL
SPECIFIC GRAVITY, URINE: 1.005 (ref 1.005–1.030)
pH: 7 (ref 5.0–8.0)

## 2017-10-13 LAB — COMPREHENSIVE METABOLIC PANEL
ALK PHOS: 101 U/L (ref 38–126)
ALT: 18 U/L (ref 14–54)
AST: 21 U/L (ref 15–41)
Albumin: 3.7 g/dL (ref 3.5–5.0)
Anion gap: 9 (ref 5–15)
BILIRUBIN TOTAL: 0.5 mg/dL (ref 0.3–1.2)
BUN: 13 mg/dL (ref 6–20)
CALCIUM: 8.9 mg/dL (ref 8.9–10.3)
CO2: 23 mmol/L (ref 22–32)
CREATININE: 0.9 mg/dL (ref 0.44–1.00)
Chloride: 101 mmol/L (ref 101–111)
GFR, EST NON AFRICAN AMERICAN: 56 mL/min — AB (ref >60)
Glucose, Bld: 287 mg/dL — ABNORMAL HIGH (ref 65–99)
Potassium: 3.6 mmol/L (ref 3.5–5.1)
Sodium: 133 mmol/L — ABNORMAL LOW (ref 135–145)
Total Protein: 7.6 g/dL (ref 6.5–8.1)

## 2017-10-13 LAB — MRSA PCR SCREENING: MRSA BY PCR: NEGATIVE

## 2017-10-13 LAB — CBC WITH DIFFERENTIAL/PLATELET
BASOS ABS: 0.1 10*3/uL (ref 0–0.1)
Basophils Relative: 1 %
EOS PCT: 4 %
Eosinophils Absolute: 0.4 10*3/uL (ref 0–0.7)
HEMATOCRIT: 37.8 % (ref 35.0–47.0)
Hemoglobin: 12.6 g/dL (ref 12.0–16.0)
LYMPHS ABS: 1.4 10*3/uL (ref 1.0–3.6)
LYMPHS PCT: 14 %
MCH: 29.9 pg (ref 26.0–34.0)
MCHC: 33.4 g/dL (ref 32.0–36.0)
MCV: 89.7 fL (ref 80.0–100.0)
MONO ABS: 0.7 10*3/uL (ref 0.2–0.9)
Monocytes Relative: 7 %
NEUTROS ABS: 7.5 10*3/uL — AB (ref 1.4–6.5)
Neutrophils Relative %: 74 %
Platelets: 251 10*3/uL (ref 150–440)
RBC: 4.21 MIL/uL (ref 3.80–5.20)
RDW: 14 % (ref 11.5–14.5)
WBC: 10.1 10*3/uL (ref 3.6–11.0)

## 2017-10-13 LAB — TROPONIN I: Troponin I: <0.03 ng/mL (ref <0.03)

## 2017-10-13 LAB — GLUCOSE, CAPILLARY
GLUCOSE-CAPILLARY: 313 mg/dL — AB (ref 65–99)
Glucose-Capillary: 288 mg/dL — ABNORMAL HIGH (ref 65–99)
Glucose-Capillary: 245 mg/dL — ABNORMAL HIGH (ref 65–99)

## 2017-10-13 MED ORDER — GLIPIZIDE 5 MG PO TABS
5.0000 mg | ORAL_TABLET | Freq: Two times a day (BID) | ORAL | Status: DC
  Administered 2017-10-14 – 2017-10-15 (×3): 5 mg via ORAL
  Filled 2017-10-13 (×4): qty 1

## 2017-10-13 MED ORDER — MIRTAZAPINE 15 MG PO TABS
30.0000 mg | ORAL_TABLET | Freq: Every day | ORAL | Status: DC
  Administered 2017-10-13 – 2017-10-14 (×2): 30 mg via ORAL
  Filled 2017-10-13 (×2): qty 2

## 2017-10-13 MED ORDER — CLONIDINE HCL 0.1 MG PO TABS: 0.2000 mg | ORAL_TABLET | Freq: Every day | ORAL | Status: DC

## 2017-10-13 MED ORDER — ACETAMINOPHEN 325 MG PO TABS
650.0000 mg | ORAL_TABLET | Freq: Four times a day (QID) | ORAL | Status: DC | PRN
  Administered 2017-10-14 (×2): 650 mg via ORAL
  Filled 2017-10-13 (×2): qty 2

## 2017-10-13 MED ORDER — INSULIN ASPART 100 UNIT/ML ~~LOC~~ SOLN
0.0000 [IU] | Freq: Three times a day (TID) | SUBCUTANEOUS | Status: DC
Start: 2017-10-13 — End: 2017-10-14
  Administered 2017-10-13: 3 [IU] via SUBCUTANEOUS
  Administered 2017-10-14: 09:00:00 5 [IU] via SUBCUTANEOUS
  Filled 2017-10-13 (×2): qty 1

## 2017-10-13 MED ORDER — CIPROFLOXACIN IN D5W 400 MG/200ML IV SOLN
400.0000 mg | Freq: Two times a day (BID) | INTRAVENOUS | Status: DC
  Administered 2017-10-14: 400 mg via INTRAVENOUS
  Filled 2017-10-13 (×2): qty 200

## 2017-10-13 MED ORDER — ASPIRIN EC 81 MG PO TBEC
81.0000 mg | DELAYED_RELEASE_TABLET | Freq: Every day | ORAL | Status: DC
  Administered 2017-10-14 – 2017-10-15 (×2): 81 mg via ORAL
  Filled 2017-10-13 (×2): qty 1

## 2017-10-13 MED ORDER — CIPROFLOXACIN IN D5W 400 MG/200ML IV SOLN
400.0000 mg | Freq: Once | INTRAVENOUS | Status: AC
  Administered 2017-10-13: 400 mg via INTRAVENOUS
  Filled 2017-10-13: qty 200

## 2017-10-13 MED ORDER — ACETAMINOPHEN 650 MG RE SUPP: 650.0000 mg | Freq: Four times a day (QID) | RECTAL | Status: DC | PRN

## 2017-10-13 MED ORDER — INSULIN ASPART 100 UNIT/ML ~~LOC~~ SOLN
0.0000 [IU] | Freq: Every day | SUBCUTANEOUS | Status: DC
Start: 2017-10-13 — End: 2017-10-15
  Administered 2017-10-13: 22:00:00 4 [IU] via SUBCUTANEOUS
  Administered 2017-10-14: 3 [IU] via SUBCUTANEOUS
  Filled 2017-10-13 (×2): qty 1

## 2017-10-13 MED ORDER — LEVOTHYROXINE SODIUM 50 MCG PO TABS
75.0000 ug | ORAL_TABLET | Freq: Every day | ORAL | Status: DC
  Administered 2017-10-14 – 2017-10-15 (×2): 75 ug via ORAL
  Filled 2017-10-13 (×2): qty 1

## 2017-10-13 MED ORDER — INFLUENZA VAC SPLIT HIGH-DOSE 0.5 ML IM SUSY
0.5000 mL | PREFILLED_SYRINGE | INTRAMUSCULAR | Status: DC
  Filled 2017-10-13: qty 0.5

## 2017-10-13 MED ORDER — AMLODIPINE BESYLATE 5 MG PO TABS
5.0000 mg | ORAL_TABLET | Freq: Every day | ORAL | Status: DC
  Administered 2017-10-14 – 2017-10-15 (×2): 5 mg via ORAL
  Filled 2017-10-13 (×2): qty 1

## 2017-10-13 MED ORDER — ENOXAPARIN SODIUM 40 MG/0.4ML ~~LOC~~ SOLN
40.0000 mg | SUBCUTANEOUS | Status: DC
  Administered 2017-10-13 – 2017-10-14 (×2): 40 mg via SUBCUTANEOUS
  Filled 2017-10-13 (×2): qty 0.4

## 2017-10-13 MED ORDER — LINAGLIPTIN 5 MG PO TABS
5.0000 mg | ORAL_TABLET | Freq: Every day | ORAL | Status: DC
  Administered 2017-10-14 – 2017-10-15 (×2): 5 mg via ORAL
  Filled 2017-10-13 (×2): qty 1

## 2017-10-13 MED ORDER — SODIUM CHLORIDE 0.9 % IV BOLUS (SEPSIS)
1000.0000 mL | Freq: Once | INTRAVENOUS | Status: AC
  Administered 2017-10-13: 1000 mL via INTRAVENOUS

## 2017-10-13 MED ORDER — ATORVASTATIN CALCIUM 20 MG PO TABS
40.0000 mg | ORAL_TABLET | Freq: Every day | ORAL | Status: DC
  Administered 2017-10-14 – 2017-10-15 (×2): 40 mg via ORAL
  Filled 2017-10-13 (×2): qty 2

## 2017-10-13 MED ORDER — CLONIDINE HCL 0.1 MG PO TABS
0.1000 mg | ORAL_TABLET | Freq: Every day | ORAL | Status: DC
  Administered 2017-10-13 – 2017-10-14 (×2): 0.1 mg via ORAL
  Filled 2017-10-13 (×2): qty 1

## 2017-10-13 NOTE — Telephone Encounter (Signed)
Spoke with patient. She is very anxious that her sugars are staying so high. The 3 readings she gave me were 233, 295, 310. She also says she is having symptoms consistent with bladder infection. She is having burning and frequency but no other symptoms. I advised that she make an appointment at [facility] or be seen at urgent care and she says she cannot do that. Her chair is not fixed but she is able to use it. She does not have anyone to take her anywhere.

## 2017-10-13 NOTE — Progress Notes (Signed)
Pharmacy Antibiotic Note  Alexandra KempBetty L English is a 83 y.o. female admitted on 10/13/2017 with UTI.  Pharmacy has been consulted for ciprofloxacin dosing.  Plan: Ciprofloxacin 400 mg IV Q12H (PCN allergy, CrCl > 30 mL/min)  Height: 5\' 1"  (154.9 cm) Weight: 190 lb (86.2 kg) IBW/kg (Calculated) : 47.8  Temp (24hrs), Avg:97.9 F (36.6 C), Min:97.9 F (36.6 C), Max:97.9 F (36.6 C)   Recent Labs Lab 10/13/17 1301  WBC 10.1  CREATININE 0.90    Estimated Creatinine Clearance: 43.9 mL/min (by C-G formula based on SCr of 0.9 mg/dL).    Allergies  Allergen Reactions  . Beta Adrenergic Blockers   . Cephalosporins     Taken omnicef without a problem. MGA 07/2017  . Lisinopril   . Macrobid [Nitrofurantoin Macrocrystal]   . Toprol Xl [Metoprolol Succinate]   . Penicillins Swelling and Rash    Has patient had a PCN reaction causing immediate rash, facial/tongue/throat swelling, SOB or lightheadedness with hypotension: Yes Has patient had a PCN reaction causing severe rash involving mucus membranes or skin necrosis: Unknown Has patient had a PCN reaction that required hospitalization: No Has patient had a PCN reaction occurring within the last 10 years: No If all of the above answers are "NO", then may proceed with Cephalosporin use.    Thank you for allowing pharmacy to be a part of this patient's care.  Carola FrostNathan A Dawud Mays, Pharm.D., BCPS Clinical Pharmacist 10/13/2017 4:02 PM

## 2017-10-13 NOTE — Telephone Encounter (Signed)
Can we circle back with pt  Please ensure her BP's have improved. She has run out of meds a couple of weeks ago and I want to ensure that has resolved.

## 2017-10-13 NOTE — Telephone Encounter (Signed)
Patient says that she may have a neighbor that can take her to the hospital but it will be lunch time. I told patient that I would follow up with her this afternoon and if he could not take her she should call 911.

## 2017-10-13 NOTE — ED Provider Notes (Signed)
[facility] Emergency Department Provider Note ____________________________________________   I have reviewed the triage vital signs and the triage nursing note.  HISTORY  Chief Complaint Hyperglycemia   Historian Patient  HPI Alexandra English is a 83 y.o. female with a history of diabetes, oral hypoglycemic controlled, presents from assisted living because for fatigue, generalized weakness, headache, nausea and elevated blood sugar this morning near 300.  No fever.  She has had dysuria.  Mild lower abdominal discomfort without point pain.  No coughing or trouble breathing.  States she is really been too weak to get up and around on her own today.   Past Medical History:  Diagnosis Date  . Diabetes mellitus 04/2009   HgbA1c 7.1% 07/2009, Monofilament normal, Ortho pending  . Fracture    of rib  . Glaucoma    Dr. Thomasene Ripple  . Hypertension   . Sleep apnea    Per Pt. was supposed to start CPAP, however reluctant to try  . Stroke [facility])    Left frontal4/2010, Left parietal and right parietal 04/2011 , Started on Plavix and then changed to Coumadin by recommendation of Dr. Carlis Abbott  . Thyroid disease    hypothyroidism    Patient Active Problem List   Diagnosis Date Noted  . Fall 08/16/2017  . Acute cystitis 08/14/2017  . GERD (gastroesophageal reflux disease) 07/10/2017  . Encounter for medication review and counseling 07/02/2017  . Dizziness 06/05/2017  . CVA (cerebral vascular accident) ([place]) 06/05/2017  . Nonintractable headache 02/03/2017  . Syncope 10/19/2016  . Dysuria 01/17/2016  . Candidal dermatitis 01/17/2016  . Back pain 06/27/2015  . Diabetic polyneuropathy associated with type 2 diabetes mellitus ([place]) 08/19/2014  . Edema 06/04/2013  . Pre-ulcerative corn or callous 01/13/2013  . Anxiety and depression 05/12/2012  . Arthralgia 01/09/2012  . Gait instability 12/02/2011  . Hypertension 09/11/2011  . Hyperlipidemia 09/11/2011  . History  of stroke 09/11/2011  . DM (diabetes mellitus), type 2 with complications ([place]) 97/41/6384  . Hypothyroidism 09/11/2011    Past Surgical History:  Procedure Laterality Date  . orthopedic surgeon     Dr. Latanya Maudlin  . TOTAL ABDOMINAL HYSTERECTOMY    . TOTAL SHOULDER REPLACEMENT Right   . WRIST SURGERY Left     Prior to Admission medications   Medication Sig Start Date End Date Taking? Authorizing Provider  amLODipine (NORVASC) 5 MG tablet Take 1 tablet (5 mg total) by mouth daily. 04/09/17  Yes Burnard Hawthorne, FNP  aspirin 81 MG tablet Take 81 mg by mouth daily.     Yes [provider]  atorvastatin (LIPITOR) 40 MG tablet Take 1 tablet (40 mg total) by mouth daily. 01/09/17  Yes Burnard Hawthorne, FNP  cloNIDine (CATAPRES) 0.2 MG tablet Take 1 tablet (0.2 mg total) by mouth at bedtime. 01/22/17  Yes Arnett, Yvetta Coder, FNP  glipiZIDE (GLUCOTROL) 5 MG tablet TAKE 1 TABLET BY MOUTH TWICE DAILY BEFORE A MEAL 08/08/17  Yes Arnett, Yvetta Coder, FNP  levothyroxine (SYNTHROID, LEVOTHROID) 75 MCG tablet Take 1 tablet (75 mcg total) by mouth daily. 01/22/17  Yes Burnard Hawthorne, FNP  mirtazapine (REMERON) 30 MG tablet TAKE 1 TABLET BY MOUTH AT BEDTIME 09/22/17  Yes Arnett, Yvetta Coder, FNP  sitaGLIPtin (JANUVIA) 25 MG tablet Take 1 tablet (25 mg total) by mouth daily. 05/26/17  Yes Burnard Hawthorne, FNP  Blood Glucose Monitoring Suppl (ONE TOUCH ULTRA SYSTEM KIT) W/DEVICE KIT 1 kit by Does not apply route once. 08/19/14  Jackolyn Confer, MD  glucose blood (ONE TOUCH ULTRA TEST) test strip USE ONE STRIP TO CHECK GLUCOSE TWICE DAILY E11.8 09/05/16   Thersa Salt G, DO  Lancets [facility] ULTRASOFT) lancets Use as instructed 09/03/16   Coral Spikes, DO  mirtazapine (REMERON) 30 MG tablet TAKE 1 TABLET BY MOUTH AT BEDTIME Patient not taking: Reported on 10/13/2017 09/22/17   Burnard Hawthorne, FNP  omeprazole (PRILOSEC) 20 MG capsule Take 1 capsule (20 mg total) by mouth daily. Patient not  taking: Reported on 10/13/2017 07/08/17   Burnard Hawthorne, FNP    Allergies  Allergen Reactions  . Beta Adrenergic Blockers   . Cephalosporins     Taken omnicef without a problem. MGA 07/2017  . Lisinopril   . Macrobid [Nitrofurantoin Macrocrystal]   . Toprol Xl [Metoprolol Succinate]   . Penicillins Swelling and Rash    Has patient had a PCN reaction causing immediate rash, facial/tongue/throat swelling, SOB or lightheadedness with hypotension: Yes Has patient had a PCN reaction causing severe rash involving mucus membranes or skin necrosis: Unknown Has patient had a PCN reaction that required hospitalization: No Has patient had a PCN reaction occurring within the last 10 years: No If all of the above answers are "NO", then may proceed with Cephalosporin use.    Family History  Problem Relation Age of Onset  . CAD Mother   . CAD Father   . Diabetes Mellitus I Brother   . Diabetes Mellitus II Maternal Grandmother     Social History Social History  Substance Use Topics  . Smoking status: Never Smoker  . Smokeless tobacco: Never Used  . Alcohol use No    Review of Systems  Constitutional: Negative for fever. Eyes: Negative for visual changes. ENT: Negative for sore throat. Cardiovascular: Negative for chest pain. Respiratory: Negative for shortness of breath. Gastrointestinal: Negative for vomiting and diarrhea. Genitourinary: Positive for dysuria. Musculoskeletal: Negative for back pain. Skin: Negative for rash. Neurological: Negative for headache.  ____________________________________________   PHYSICAL EXAM:  VITAL SIGNS: ED Triage Vitals [10/13/17 1306]  Enc Vitals Group     BP (!) 119/45     Pulse Rate (!) 50     Resp 19     Temp 97.9 F (36.6 C)     Temp Source Oral     SpO2 97 %     Weight 190 lb (86.2 kg)     Height 5' 1"  (1.549 m)     Head Circumference      Peak Flow      Pain Score      Pain Loc      Pain Edu?      Excl. in [place]?       Constitutional: Alert and oriented. Well appearing and in no distress.  She does look like she does not feel well overall. HEENT   Head: Normocephalic and atraumatic.      Eyes: Conjunctivae are normal. Pupils equal and round.       Ears:         Nose: No congestion/rhinnorhea.   Mouth/Throat: Mucous membranes are dry.   Neck: No stridor. Cardiovascular/Chest: Normal rate, regular rhythm.  No murmurs, rubs, or gallops. Respiratory: Normal respiratory effort without tachypnea nor retractions. Breath sounds are clear and equal bilaterally. No wheezes/rales/rhonchi. Gastrointestinal: Soft. No distention, no guarding, no rebound. Nontender.    Genitourinary/rectal:Deferred Musculoskeletal: Nontender with normal range of motion in all extremities. No joint effusions.  No lower extremity tenderness.  No edema. Neurologic:  Normal speech and language. No gross or focal neurologic deficits are appreciated. Skin:  Skin is warm, dry and intact. No rash noted. Psychiatric: Mood and affect are normal. Speech and behavior are normal. Patient exhibits appropriate insight and judgment.   ____________________________________________  LABS (pertinent positives/negatives) I, Lisa Roca, MD the attending physician have reviewed the labs noted below.  Labs Reviewed  GLUCOSE, CAPILLARY - Abnormal; Notable for the following:       Result Value   Glucose-Capillary 288 (*)    All other components within normal limits  URINALYSIS, COMPLETE (UACMP) WITH MICROSCOPIC - Abnormal; Notable for the following:    Color, Urine YELLOW (*)    APPearance HAZY (*)    Glucose, UA 150 (*)    Leukocytes, UA LARGE (*)    Bacteria, UA FEW (*)    Squamous Epithelial / LPF 0-5 (*)    All other components within normal limits  CBC WITH DIFFERENTIAL/PLATELET - Abnormal; Notable for the following:    Neutro Abs 7.5 (*)    All other components within normal limits  CULTURE, BLOOD (ROUTINE X 2)  CULTURE,  BLOOD (ROUTINE X 2)  COMPREHENSIVE METABOLIC PANEL  TROPONIN I    ____________________________________________    EKG I, Lisa Roca, MD, the attending physician have personally viewed and interpreted all ECGs.  57 bpm.  Normal sinus rhythm.  Narrow QS.  Normal axis.  Nonspecific ST and T wave ____________________________________________  RADIOLOGY All Xrays were viewed by me.  Imaging interpreted by Radiologist, and I, Lisa Roca, MD the attending physician have reviewed the radiologist interpretation noted below.  None __________________________________________  PROCEDURES  Procedure(s) performed: None  Critical Care performed: None  ____________________________________________  No current facility-administered medications on file prior to encounter.    Current Outpatient Prescriptions on File Prior to Encounter  Medication Sig Dispense Refill  . amLODipine (NORVASC) 5 MG tablet Take 1 tablet (5 mg total) by mouth daily. 90 tablet 3  . aspirin 81 MG tablet Take 81 mg by mouth daily.      Marland Kitchen atorvastatin (LIPITOR) 40 MG tablet Take 1 tablet (40 mg total) by mouth daily. 90 tablet 3  . cloNIDine (CATAPRES) 0.2 MG tablet Take 1 tablet (0.2 mg total) by mouth at bedtime. 90 tablet 3  . glipiZIDE (GLUCOTROL) 5 MG tablet TAKE 1 TABLET BY MOUTH TWICE DAILY BEFORE A MEAL 60 tablet 2  . levothyroxine (SYNTHROID, LEVOTHROID) 75 MCG tablet Take 1 tablet (75 mcg total) by mouth daily. 90 tablet 3  . mirtazapine (REMERON) 30 MG tablet TAKE 1 TABLET BY MOUTH AT BEDTIME 30 tablet 3  . sitaGLIPtin (JANUVIA) 25 MG tablet Take 1 tablet (25 mg total) by mouth daily. 90 tablet 1  . Blood Glucose Monitoring Suppl (ONE TOUCH ULTRA SYSTEM KIT) W/DEVICE KIT 1 kit by Does not apply route once. 1 each 0  . glucose blood (ONE TOUCH ULTRA TEST) test strip USE ONE STRIP TO CHECK GLUCOSE TWICE DAILY E11.8 100 each 2  . Lancets (ONETOUCH ULTRASOFT) lancets Use as instructed 100 each 3  .  mirtazapine (REMERON) 30 MG tablet TAKE 1 TABLET BY MOUTH AT BEDTIME (Patient not taking: Reported on 10/13/2017) 30 tablet 3  . omeprazole (PRILOSEC) 20 MG capsule Take 1 capsule (20 mg total) by mouth daily. (Patient not taking: Reported on 10/13/2017) 30 capsule 1    ____________________________________________  ED COURSE / ASSESSMENT AND PLAN  Pertinent labs & imaging results that were available during my care  of the patient were reviewed by me and considered in my medical decision making (see chart for details).    Patient is stable vital signs, but she looks like she does not feel well and she is reporting extreme fatigue and trouble getting around her home with elevated blood sugars.  She thinks she had a urinary tract infection.  On laboratory studies, patient does have a urinary tract infection.  I will initiate treatment.  Patient has a number of different medication allergies including cephalosporins and penicillins.  I will treat patient with Cipro.  Urine culture was sent.  She does not have an elevated white blood cell count right now, however she does have a left shift, and patient is reporting some systemic symptoms, and so I told a do not have a high suspicion for sepsis at present, I am going to send blood cultures and I think it is worthwhile to treat this patient in the hospital over the next 24 hours out of concern for possible decompensation, patient states that she continues to feel extremely fatigued with her elevated blood sugars likely due to the UTI.  DIFFERENTIAL DIAGNOSIS: Including but not limited to DKA, HON K, hyperglycemia, dehydration, urinary tract infection, electrolyte disturbance, viral syndrome, ACS, etc.  CONSULTATIONS:   Hospitalist for admission.   Patient / Family / Caregiver informed of clinical course, medical decision-making process, and agree with plan.   ___________________________________________   FINAL CLINICAL IMPRESSION(S) / ED  DIAGNOSES   Final diagnoses:  Fatigue, unspecified type  Urinary tract infection with hematuria, site unspecified  Hyperglycemia              Note: This dictation was prepared with Dragon dictation. Any transcriptional errors that result from this process are unintentional    Lisa Roca, MD 10/13/17 [phone]

## 2017-10-13 NOTE — Telephone Encounter (Signed)
We do not have any openings. She may need to go to urgent care or be scheduled with another provider.

## 2017-10-13 NOTE — ED Triage Notes (Signed)
[facility] Pt was complaining of having blood sugar problems. Facility called for pt to be transported. Pt is currently taking januvia for her diabetis. Pt CBG is 288 upon arrival. Pt is A/o

## 2017-10-13 NOTE — Telephone Encounter (Signed)
Very concerned with lack of transportation and son not in town  Bladder infection can cause elevated sugars, sepsis  PLEASE advise 911 ; or call for her.

## 2017-10-13 NOTE — H&P (Signed)
[place] [facility] [place] NAME: Alexandra English    MR#:  [id number]  DATE OF BIRTH:  04/10/1927  DATE OF ADMISSION:  10/13/2017  PRIMARY CARE PHYSICIAN: Burnard Hawthorne, FNP   REQUESTING/REFERRING PHYSICIAN: Dr Lisa Roca  CHIEF COMPLAINT:   Chief Complaint  Patient presents with  . Hyperglycemia    HISTORY OF PRESENT ILLNESS:  Alexandra English  is a 83 y.o. female with a known history of diabetes presents to the ER with high sugars and a bladder infection and difficulty with her thoughts.  She states that her blood sugars have been high.  She has burning on urination.  She has lower abdominal pain described as an ache and a bad pain she was unable to give a scale on it.  Nothing made it better nothing made it worse.  She states he cannot think straight.  Patient states that her legs have been a little more red and bottom to her.  History obtained from patient and old chart.  Hospitalist services contacted for evaluation.  PAST MEDICAL HISTORY:   Past Medical History:  Diagnosis Date  . Diabetes mellitus 04/2009   HgbA1c 7.1% 07/2009, Monofilament normal, Ortho pending  . Fracture    of rib  . Glaucoma    Dr. Thomasene Ripple  . Hypertension   . Sleep apnea    Per Pt. was supposed to start CPAP, however reluctant to try  . Stroke [facility])    Left frontal4/2010, Left parietal and right parietal 04/2011 , Started on Plavix and then changed to Coumadin by recommendation of Dr. Carlis Abbott  . Thyroid disease    hypothyroidism    PAST SURGICAL HISTORY:   Past Surgical History:  Procedure Laterality Date  . orthopedic surgeon     Dr. Latanya Maudlin  . TOTAL ABDOMINAL HYSTERECTOMY    . TOTAL SHOULDER REPLACEMENT Right   . WRIST SURGERY Left     SOCIAL HISTORY:   Social History  Substance Use Topics  . Smoking status: Never Smoker  . Smokeless tobacco: Never Used  . Alcohol use No    FAMILY HISTORY:   Family History  Problem Relation Age of  Onset  . CAD Mother   . CAD Father   . Diabetes Mellitus I Brother   . Diabetes Mellitus II Maternal Grandmother     DRUG ALLERGIES:   Allergies  Allergen Reactions  . Beta Adrenergic Blockers   . Cephalosporins     Taken omnicef without a problem. MGA 07/2017  . Lisinopril   . Macrobid [Nitrofurantoin Macrocrystal]   . Toprol Xl [Metoprolol Succinate]   . Penicillins Swelling and Rash    Has patient had a PCN reaction causing immediate rash, facial/tongue/throat swelling, SOB or lightheadedness with hypotension: Yes Has patient had a PCN reaction causing severe rash involving mucus membranes or skin necrosis: Unknown Has patient had a PCN reaction that required hospitalization: No Has patient had a PCN reaction occurring within the last 10 years: No If all of the above answers are "NO", then may proceed with Cephalosporin use.    REVIEW OF SYSTEMS:  CONSTITUTIONAL: No fever.  Positive for fatigue.  EYES: No blurred or double vision.  Wears glasses. EARS, NOSE, AND THROAT: No tinnitus or ear pain. No sore throat.  Positive runny nose.  Dysphagia to liquids. RESPIRATORY: No cough, positive for shortness of breath, no wheezing or hemoptysis.  CARDIOVASCULAR: No chest pain, orthopnea, edema.  GASTROINTESTINAL: No nausea, vomiting, diarrhea.  Positive for  abdominal pain. No blood in bowel movements GENITOURINARY: Positive for dysuria, no hematuria.  ENDOCRINE: No polyuria, nocturia,  HEMATOLOGY: No anemia, easy bruising or bleeding SKIN: No rash or lesion. MUSCULOSKELETAL: No joint pain or arthritis.   NEUROLOGIC: No tingling, numbness, weakness.  PSYCHIATRY: No anxiety or depression.   MEDICATIONS AT HOME:   Prior to Admission medications   Medication Sig Start Date End Date Taking? Authorizing Provider  amLODipine (NORVASC) 5 MG tablet Take 1 tablet (5 mg total) by mouth daily. 04/09/17  Yes Burnard Hawthorne, FNP  aspirin 81 MG tablet Take 81 mg by mouth daily.     Yes  [provider]  atorvastatin (LIPITOR) 40 MG tablet Take 1 tablet (40 mg total) by mouth daily. 01/09/17  Yes Burnard Hawthorne, FNP  cloNIDine (CATAPRES) 0.2 MG tablet Take 1 tablet (0.2 mg total) by mouth at bedtime. 01/22/17  Yes Arnett, Yvetta Coder, FNP  glipiZIDE (GLUCOTROL) 5 MG tablet TAKE 1 TABLET BY MOUTH TWICE DAILY BEFORE A MEAL 08/08/17  Yes Arnett, Yvetta Coder, FNP  levothyroxine (SYNTHROID, LEVOTHROID) 75 MCG tablet Take 1 tablet (75 mcg total) by mouth daily. 01/22/17  Yes Burnard Hawthorne, FNP  mirtazapine (REMERON) 30 MG tablet TAKE 1 TABLET BY MOUTH AT BEDTIME 09/22/17  Yes Arnett, Yvetta Coder, FNP  sitaGLIPtin (JANUVIA) 25 MG tablet Take 1 tablet (25 mg total) by mouth daily. 05/26/17  Yes Burnard Hawthorne, FNP  Blood Glucose Monitoring Suppl (ONE TOUCH ULTRA SYSTEM KIT) W/DEVICE KIT 1 kit by Does not apply route once. 08/19/14   Jackolyn Confer, MD  glucose blood (ONE TOUCH ULTRA TEST) test strip USE ONE STRIP TO CHECK GLUCOSE TWICE DAILY E11.8 09/05/16   Coral Spikes, DO  Lancets [facility] ULTRASOFT) lancets Use as instructed 09/03/16   Coral Spikes, DO      VITAL SIGNS:  Blood pressure (!) 118/46, pulse (!) 46, temperature 97.9 F (36.6 C), temperature source Oral, resp. rate 19, height 5' 1"  (1.549 m), weight 86.2 kg (190 lb), SpO2 96 %.  PHYSICAL EXAMINATION:  GENERAL:  83 y.o.-year-old patient lying in the bed with no acute distress.  EYES: Pupils equal, round, reactive to light and accommodation. No scleral icterus. Extraocular muscles intact.  HEENT: Head atraumatic, normocephalic. Oropharynx and nasopharynx clear.  NECK:  Supple, no jugular venous distention. No thyroid enlargement, no tenderness.  LUNGS: Normal breath sounds bilaterally, no wheezing, rales,rhonchi or crepitation. No use of accessory muscles of respiration.  CARDIOVASCULAR: S1, S2 normal. No murmurs, rubs, or gallops.  ABDOMEN: Soft, nontender, nondistended. Bowel sounds present. No  organomegaly or mass.  EXTREMITIES: 2+ no pedal edema, cyanosis, or clubbing.  NEUROLOGIC: Cranial nerves II through XII are intact. Muscle strength 5/5 in all extremities. Sensation intact. Gait not checked.  PSYCHIATRIC: The patient is alert and oriented x 3.  SKIN: Slight erythema bilateral lower extremities.  LABORATORY PANEL:   CBC  Recent Labs Lab 10/13/17 1301  WBC 10.1  HGB 12.6  HCT 37.8  PLT 251   ------------------------------------------------------------------------------------------------------------------  Chemistries   Recent Labs Lab 10/13/17 1301  NA 133*  K 3.6  CL 101  CO2 23  GLUCOSE 287*  BUN 13  CREATININE 0.90  CALCIUM 8.9  AST 21  ALT 18  ALKPHOS 101  BILITOT 0.5   ------------------------------------------------------------------------------------------------------------------  Cardiac Enzymes  Recent Labs Lab 10/13/17 1301  TROPONINI <0.03   ------------------------------------------------------------------------------------------------------------------    EKG:   Sinus rhythm 57 bpm  IMPRESSION AND PLAN:  1.  Acute encephalopathy with acute cystitis without hematuria.  Unsure if she also has cellulitis of the bilateral or chronic venous stasis.  Patient has an allergy to penicillins and Cipro was started which would cover both.  We will also get physical therapy evaluation. 2.  Type 2 diabetes with hyperglycemia.  Check hemoglobin A1c.  Put on sliding scale.  Continue oral medications. 3.  Essential hypertension and bradycardia.  Cut back on clonidine dose to 0.1 mg at night.  Hopefully can get off this medication. 4.  Hyperlipidemia unspecified on Lipitor 5.  Hypothyroidism unspecified on levothyroxine   All the records are reviewed and case discussed with ED provider. Management plans discussed with the patient, and she is in agreement.  CODE STATUS: full code  TOTAL TIME TAKING CARE OF THIS PATIENT: 50 minutes.     Loletha Grayer M.D on 10/13/2017 at 3:53 PM  Between 7am to 6pm - Pager - [phone]  After 6pm call admission pager [phone]  [facility] Office  [phone]  CC: Primary care physician; Burnard Hawthorne, FNP

## 2017-10-13 NOTE — Telephone Encounter (Signed)
Called patient but was unable to leave Vm due to Vm box being full

## 2017-10-13 NOTE — ED Notes (Signed)
Pt incontinent of urine, clean linen and depends changes and urine collection device initiated.

## 2017-10-13 NOTE — Telephone Encounter (Signed)
Pt called about having a UTI no appt avail to sch. Pt needs a morning appt for her transportation. If you can look at [facility] and possibly let me know what pt I can put in the afternoon or another day and then sch [name] in the morning. Thank you!

## 2017-10-14 DIAGNOSIS — N390 Urinary tract infection, site not specified: Secondary | ICD-10-CM | POA: Diagnosis not present

## 2017-10-14 DIAGNOSIS — G9340 Encephalopathy, unspecified: Secondary | ICD-10-CM | POA: Diagnosis not present

## 2017-10-14 DIAGNOSIS — E1165 Type 2 diabetes mellitus with hyperglycemia: Secondary | ICD-10-CM | POA: Diagnosis not present

## 2017-10-14 DIAGNOSIS — I10 Essential (primary) hypertension: Secondary | ICD-10-CM | POA: Diagnosis not present

## 2017-10-14 DIAGNOSIS — L8990 Pressure ulcer of unspecified site, unspecified stage: Secondary | ICD-10-CM | POA: Insufficient documentation

## 2017-10-14 DIAGNOSIS — N3000 Acute cystitis without hematuria: Secondary | ICD-10-CM | POA: Diagnosis not present

## 2017-10-14 LAB — BASIC METABOLIC PANEL
Anion gap: 6 (ref 5–15)
BUN: 14 mg/dL (ref 6–20)
CHLORIDE: 106 mmol/L (ref 101–111)
CO2: 25 mmol/L (ref 22–32)
Calcium: 8.7 mg/dL — ABNORMAL LOW (ref 8.9–10.3)
Creatinine, Ser: 0.96 mg/dL (ref 0.44–1.00)
GFR calc Af Amer: 60 mL/min — ABNORMAL LOW (ref >60)
GFR calc non Af Amer: 52 mL/min — ABNORMAL LOW (ref >60)
Glucose, Bld: 254 mg/dL — ABNORMAL HIGH (ref 65–99)
POTASSIUM: 3.5 mmol/L (ref 3.5–5.1)
SODIUM: 137 mmol/L (ref 135–145)

## 2017-10-14 LAB — CBC
HEMATOCRIT: 36.5 % (ref 35.0–47.0)
Hemoglobin: 12.2 g/dL (ref 12.0–16.0)
MCH: 30 pg (ref 26.0–34.0)
MCHC: 33.3 g/dL (ref 32.0–36.0)
MCV: 89.9 fL (ref 80.0–100.0)
Platelets: 244 10*3/uL (ref 150–440)
RBC: 4.06 MIL/uL (ref 3.80–5.20)
RDW: 13.8 % (ref 11.5–14.5)
WBC: 10 10*3/uL (ref 3.6–11.0)

## 2017-10-14 LAB — GLUCOSE, CAPILLARY
GLUCOSE-CAPILLARY: 302 mg/dL — AB (ref 65–99)
Glucose-Capillary: 256 mg/dL — ABNORMAL HIGH (ref 65–99)
Glucose-Capillary: 118 mg/dL — ABNORMAL HIGH (ref 65–99)
Glucose-Capillary: 259 mg/dL — ABNORMAL HIGH (ref 65–99)

## 2017-10-14 MED ORDER — INFLUENZA VAC SPLIT HIGH-DOSE 0.5 ML IM SUSY
0.5000 mL | PREFILLED_SYRINGE | INTRAMUSCULAR | Status: AC
  Administered 2017-10-15: 0.5 mL via INTRAMUSCULAR
  Filled 2017-10-14: qty 0.5

## 2017-10-14 MED ORDER — DEXTROSE 5 % IV SOLN
1.0000 g | INTRAVENOUS | Status: DC
  Administered 2017-10-14: 1 g via INTRAVENOUS
  Filled 2017-10-14 (×2): qty 10

## 2017-10-14 MED ORDER — INSULIN ASPART 100 UNIT/ML ~~LOC~~ SOLN
0.0000 [IU] | Freq: Three times a day (TID) | SUBCUTANEOUS | Status: DC
  Administered 2017-10-14: 12:00:00 11 [IU] via SUBCUTANEOUS
  Administered 2017-10-15: 08:00:00 5 [IU] via SUBCUTANEOUS
  Administered 2017-10-15: 12:00:00 8 [IU] via SUBCUTANEOUS
  Filled 2017-10-14 (×3): qty 1

## 2017-10-14 NOTE — Progress Notes (Signed)
[employer] [facility] [facility] [place] [facility]   PATIENT NAME: Alexandra RoanBetty Ederer    MR#:  [id number]  DATE OF BIRTH:  [birth year]  SUBJECTIVE:  CHIEF COMPLAINT:   Chief Complaint  Patient presents with  . Hyperglycemia     Came with UTI, some confusion and Hyperglycemia. Feels better today. Ur cx is awaited.  REVIEW OF SYSTEMS:  CONSTITUTIONAL: No fever, positive for fatigue or weakness.  EYES: No blurred or double vision.  EARS, NOSE, AND THROAT: No tinnitus or ear pain.  RESPIRATORY: No cough, shortness of breath, wheezing or hemoptysis.  CARDIOVASCULAR: No chest pain, orthopnea, edema.  GASTROINTESTINAL: No nausea, vomiting, diarrhea or abdominal pain.  GENITOURINARY: No dysuria, hematuria.  ENDOCRINE: No polyuria, nocturia,  HEMATOLOGY: No anemia, easy bruising or bleeding SKIN: No rash or lesion. MUSCULOSKELETAL: No joint pain or arthritis.   NEUROLOGIC: No tingling, numbness, weakness.  PSYCHIATRY: No anxiety or depression.   ROS  DRUG ALLERGIES:   Allergies  Allergen Reactions  . Beta Adrenergic Blockers   . Cephalosporins     Taken omnicef without a problem. MGA 07/2017  . Lisinopril   . Macrobid [Nitrofurantoin Macrocrystal]   . Toprol Xl [Metoprolol Succinate]   . Penicillins Swelling and Rash    Has patient had a PCN reaction causing immediate rash, facial/tongue/throat swelling, SOB or lightheadedness with hypotension: Yes Has patient had a PCN reaction causing severe rash involving mucus membranes or skin necrosis: Unknown Has patient had a PCN reaction that required hospitalization: No Has patient had a PCN reaction occurring within the last 10 years: No If all of the above answers are "NO", then may proceed with Cephalosporin use.    VITALS:  Blood pressure (!) 149/48, pulse (!) 56, temperature 97.9 F (36.6 C), temperature source Oral, resp. rate 18, height 5\' 1"  (1.549 m), weight 86.2 kg (190 lb), SpO2 99 %.  PHYSICAL EXAMINATION:  GENERAL:  83  y.o.-year-old patient lying in the bed with no acute distress.  EYES: Pupils equal, round, reactive to light and accommodation. No scleral icterus. Extraocular muscles intact.  HEENT: Head atraumatic, normocephalic. Oropharynx and nasopharynx clear.  NECK:  Supple, no jugular venous distention. No thyroid enlargement, no tenderness.  LUNGS: Normal breath sounds bilaterally, no wheezing, rales,rhonchi or crepitation. No use of accessory muscles of respiration.  CARDIOVASCULAR: S1, S2 normal. No murmurs, rubs, or gallops.  ABDOMEN: Soft, nontender, nondistended. Bowel sounds present. No organomegaly or mass.  EXTREMITIES: No pedal edema, cyanosis, or clubbing.  NEUROLOGIC: Cranial nerves II through XII are intact. Muscle strength 4/5 in all extremities. Sensation intact. Gait not checked.  PSYCHIATRIC: The patient is alert and oriented x 3.  SKIN: No obvious rash, lesion, or ulcer.   Physical Exam LABORATORY PANEL:   CBC  Recent Labs Lab 10/14/17 0449  WBC 10.0  HGB 12.2  HCT 36.5  PLT 244   ------------------------------------------------------------------------------------------------------------------  Chemistries   Recent Labs Lab 10/13/17 1301  10/14/17 0449  NA 133*  --  137  K 3.6  --  3.5  CL 101  --  106  CO2 23  --  25  GLUCOSE 287*  --  254*  BUN 13  --  14  CREATININE 0.90  < > 0.96  CALCIUM 8.9  --  8.7*  AST 21  --   --   ALT 18  --   --   ALKPHOS 101  --   --   BILITOT 0.5  --   --   < > =  values in this interval not displayed. ------------------------------------------------------------------------------------------------------------------  Cardiac Enzymes  Recent Labs Lab 10/13/17 1301  TROPONINI <0.03   ------------------------------------------------------------------------------------------------------------------  RADIOLOGY:  No results found.  ASSESSMENT AND PLAN:   Active Problems:   Acute encephalopathy   Pressure injury of  skin  1.  Acute encephalopathy with acute cystitis without hematuria.     Ur cx sent, also have concern of cellulitis on admission, to me her legs looks fine, no redness or swelling, so no cellulitis.    Switch to rocephin as in past her Ur cx showing resistant to Cipro. She Tolerated Rocephin in past. 2.  Type 2 diabetes with hyperglycemia.  Check hemoglobin A1c.  Put on sliding scale.  Continue oral medications. 3.  Essential hypertension and bradycardia.  Cut back on clonidine dose to 0.1 mg at night.  Hopefully can get off this medication. 4.  Hyperlipidemia unspecified on Lipitor 5.  Hypothyroidism unspecified on levothyroxine  PT eval to help d/c planning.  All the records are reviewed and case discussed with Care Management/Social Workerr. Management plans discussed with the patient, family and they are in agreement.  CODE STATUS: Full.  TOTAL TIME TAKING CARE OF THIS PATIENT: 35 minutes.     POSSIBLE D/C IN 1-2 DAYS, DEPENDING ON CLINICAL CONDITION.   Altamese Dilling M.D on 10/14/2017   Between 7am to 6pm - Pager - [phone]  After 6pm go to [email or web address] - password [email or web address]  [facility]  Office  [phone]  CC: Primary care physician; Allegra Grana, FNP  Note: This dictation was prepared with Dragon dictation along with smaller phrase technology. Any transcriptional errors that result from this process are unintentional.

## 2017-10-14 NOTE — Evaluation (Addendum)
Physical Therapy Evaluation Patient Details Name: Alexandra KempBetty L English MRN: [id number] DOB: August 25, 1927 Today's Date: 10/14/2017   History of Present Illness  Alexandra English is a 83 y.o. female with a known history of diabetes presents to the ER with high sugars and a bladder infection and difficulty with her thoughts.  She states that her blood sugars have been high.  She has burning on urination.  She has lower abdominal pain described as an ache and a bad pain she was unable to give a scale on it.  Nothing made it better nothing made it worse.  She states he cannot think straight.  Patient states that her legs have been a little more red and bottom to her.  History obtained from patient and old chart.  Hospitalist services contacted for evaluation. She is now admitted for acute encephalopathy with acute cystitis.   Clinical Impression  Pt admitted with above diagnosis. Pt currently with functional limitations due to the deficits listed below (see PT Problem List). Pt is modified independent for bed mobility and CGA for transfers. Pt able to ambulate with therapist from bed to door and back. Good stability and safety awareness noted. Denies DOE with ambulation. She does have LE weakness as well as impaired balance. [facility]. Pt will benefit from PT services to address deficits in strength, balance, and mobility in order to return to full function at home.     Follow Up Recommendations Home health PT;Other (comment) ([facility]    Equipment Recommendations  None recommended by PT    Recommendations for Other Services       Precautions / Restrictions Precautions Precautions: Fall Restrictions Weight Bearing Restrictions: No      Mobility  Bed Mobility Overal bed mobility: Modified Independent             General bed mobility comments: Increased time, HOB elevated, use of bed rails  Transfers Overall  transfer level: Needs assistance Equipment used: Rolling walker (2 wheeled) Transfers: Sit to/from Stand Sit to Stand: Min guard         General transfer comment: Pt demonstrates safe hand placement with transfers. Increased time but stable on feet in standing  Ambulation/Gait Ambulation/Gait assistance: Min guard Ambulation Distance (Feet): 25 Feet Assistive device: Rolling walker (2 wheeled) Gait Pattern/deviations: Decreased step length - right;Decreased step length - left Gait velocity: Decreased Gait velocity interpretation: <1.8 ft/sec, indicative of risk for recurrent falls General Gait Details: Pt able to ambulate with therapist from bed to door and back. Good stability and safety awareness noted. Denies DOE with ambulation  Information systems managertairs            Wheelchair Mobility    Modified Rankin (Stroke Patients Only)       Balance Overall balance assessment: Needs assistance Sitting-balance support: No upper extremity supported Sitting balance-Leahy Scale: Good     Standing balance support: Bilateral upper extremity supported Standing balance-Leahy Scale: Fair Standing balance comment: Able to maintain standing balance with UE support on rolling walker                             Pertinent Vitals/Pain Pain Assessment: No/denies pain    Home Living Family/patient expects to be discharged to:: Assisted living               Home Equipment: Walker - 2 wheels;Wheelchair - power;Shower seat  Additional Comments: [facility] Independent Living apartment, on second floor with elevator access    Prior Function Level of Independence: Needs assistance   Gait / Transfers Assistance Needed: Uses electric w/c for mobility and sometimes uses RW for transfers and limited ambulation  ADL's / Homemaking Assistance Needed: Independent with ADLs, assist with IADLs        Hand Dominance        Extremity/Trunk Assessment   Upper Extremity Assessment Upper  Extremity Assessment: Overall WFL for tasks assessed    Lower Extremity Assessment Lower Extremity Assessment: Generalized weakness       Communication   Communication: HOH  Cognition Arousal/Alertness: Awake/alert Behavior During Therapy: WFL for tasks assessed/performed Overall Cognitive Status: Within Functional Limits for tasks assessed                                        General Comments      Exercises     Assessment/Plan    PT Assessment Patient needs continued PT services  PT Problem List Decreased strength;Decreased activity tolerance;Decreased balance;Decreased mobility       PT Treatment Interventions Gait training;DME instruction;Functional mobility training;Therapeutic activities;Therapeutic exercise;Balance training;Neuromuscular re-education;Patient/family education    PT Goals (Current goals can be found in the Care Plan section)  Acute Rehab PT Goals Patient Stated Goal: Return to prior function at her apartment. Does not want to go to [facility] PT Goal Formulation: With patient Time For Goal Achievement: 10/28/17 Potential to Achieve Goals: Good    Frequency Min 2X/week   Barriers to discharge Decreased caregiver support Pt lives in independent living apartment    Co-evaluation               AM-PAC PT "6 Clicks" Daily Activity  Outcome Measure Difficulty turning over in bed (including adjusting bedclothes, sheets and blankets)?: A Little Difficulty moving from lying on back to sitting on the side of the bed? : A Little Difficulty sitting down on and standing up from a chair with arms (e.g., wheelchair, bedside commode, etc,.)?: A Little Help needed moving to and from a bed to chair (including a wheelchair)?: A Little Help needed walking in hospital room?: A Little Help needed climbing 3-5 steps with a railing? : A Little 6 Click Score: 18    End of Session Equipment Utilized During Treatment: Gait belt Activity Tolerance:  Patient tolerated treatment well Patient left: in bed;with call bell/phone within reach;with bed alarm set;with nursing/sitter in room Nurse Communication: Other (comment) (Needs linens changed) PT Visit Diagnosis: Unsteadiness on feet (R26.81);History of falling (Z91.81);Muscle weakness (generalized) (M62.81)    Time: 1537-1600 PT Time Calculation (min) (ACUTE ONLY): 23 min   Charges:   PT Evaluation $PT Eval Low Complexity: 1 Low     PT G Codes:   PT G-Codes **NOT FOR INPATIENT CLASS** Functional Assessment Tool Used: AM-PAC 6 Clicks Basic Mobility Functional Limitation: Mobility: Walking and moving around Mobility: Walking and Moving Around Current Status (Z6109): At least 40 percent but less than 60 percent impaired, limited or restricted Mobility: Walking and Moving Around Goal Status [phone]): At least 20 percent but less than 40 percent impaired, limited or restricted    Lynnea Maizes PT, DPT    Ayriel Texidor 10/14/2017, 5:31 PM

## 2017-10-14 NOTE — Progress Notes (Signed)
Inpatient Diabetes Program Recommendations  AACE/ADA: New Consensus Statement on Inpatient Glycemic Control (2015)  Target Ranges:  Prepandial:   less than 140 mg/dL      Peak postprandial:   less than 180 mg/dL (1-2 hours)      Critically ill patients:  140 - 180 mg/dL   Lab Results  Component Value Date   GLUCAP 256 (H) 10/14/2017   HGBA1C 8.8 (H) 07/11/2017    Review of Glycemic Control  Results for [name], Kacia L (MRN [id number]) as of 10/14/2017 09:33  Ref. Range 10/13/2017 12:46 10/13/2017 17:54 10/13/2017 21:06 10/14/2017 08:16  Glucose-Capillary Latest Ref Range: 65 - 99 mg/dL 409288 (H) 811245 (H) 914313 (H) 256 (H)    Diabetes history: Type 2 Outpatient Diabetes medications: Glipizide 5mg  bid, Januvia 25mg  qday Current orders for Inpatient glycemic control: Glipizide 5mg  bid, Tradgenta 5mg  qday, Novolog 0-9 units tid, Novolog 0-5 units qhs  Inpatient Diabetes Program Recommendations: Consider d/c Glipizide while the patient is in the hospital.  Consider Lantus 10 units qhs beginning tonight.  Susette RacerJulie Shavontae Gibeault, RN, BA, MHA, CDE Diabetes Coordinator Inpatient Diabetes Program  [phone] (Team Pager) [phone] [facility] Office) 10/14/2017 9:37 AM

## 2017-10-15 DIAGNOSIS — Z9911 Dependence on respirator [ventilator] status: Secondary | ICD-10-CM | POA: Diagnosis not present

## 2017-10-15 DIAGNOSIS — R0600 Dyspnea, unspecified: Secondary | ICD-10-CM | POA: Diagnosis not present

## 2017-10-15 DIAGNOSIS — N3000 Acute cystitis without hematuria: Secondary | ICD-10-CM | POA: Diagnosis not present

## 2017-10-15 DIAGNOSIS — G9340 Encephalopathy, unspecified: Secondary | ICD-10-CM | POA: Diagnosis not present

## 2017-10-15 DIAGNOSIS — I10 Essential (primary) hypertension: Secondary | ICD-10-CM | POA: Diagnosis not present

## 2017-10-15 DIAGNOSIS — N390 Urinary tract infection, site not specified: Secondary | ICD-10-CM | POA: Diagnosis not present

## 2017-10-15 DIAGNOSIS — E1165 Type 2 diabetes mellitus with hyperglycemia: Secondary | ICD-10-CM | POA: Diagnosis not present

## 2017-10-15 LAB — COMPREHENSIVE METABOLIC PANEL
ALK PHOS: 81 U/L (ref 38–126)
ALT: 14 U/L (ref 14–54)
ANION GAP: 7 (ref 5–15)
AST: 16 U/L (ref 15–41)
Albumin: 3.2 g/dL — ABNORMAL LOW (ref 3.5–5.0)
BILIRUBIN TOTAL: 0.5 mg/dL (ref 0.3–1.2)
BUN: 18 mg/dL (ref 6–20)
CALCIUM: 8.9 mg/dL (ref 8.9–10.3)
CO2: 24 mmol/L (ref 22–32)
Chloride: 106 mmol/L (ref 101–111)
Creatinine, Ser: 1.18 mg/dL — ABNORMAL HIGH (ref 0.44–1.00)
GFR calc non Af Amer: 40 mL/min — ABNORMAL LOW (ref >60)
GFR, EST AFRICAN AMERICAN: 47 mL/min — AB (ref >60)
Glucose, Bld: 208 mg/dL — ABNORMAL HIGH (ref 65–99)
Potassium: 3.5 mmol/L (ref 3.5–5.1)
Sodium: 137 mmol/L (ref 135–145)
TOTAL PROTEIN: 6.5 g/dL (ref 6.5–8.1)

## 2017-10-15 LAB — CBC
HEMATOCRIT: 38.1 % (ref 35.0–47.0)
HEMOGLOBIN: 12.7 g/dL (ref 12.0–16.0)
MCH: 29.9 pg (ref 26.0–34.0)
MCHC: 33.3 g/dL (ref 32.0–36.0)
MCV: 89.6 fL (ref 80.0–100.0)
Platelets: 252 10*3/uL (ref 150–440)
RBC: 4.25 MIL/uL (ref 3.80–5.20)
RDW: 14.2 % (ref 11.5–14.5)
WBC: 9.7 10*3/uL (ref 3.6–11.0)

## 2017-10-15 LAB — GLUCOSE, CAPILLARY
GLUCOSE-CAPILLARY: 203 mg/dL — AB (ref 65–99)
GLUCOSE-CAPILLARY: 257 mg/dL — AB (ref 65–99)

## 2017-10-15 MED ORDER — CEFPODOXIME PROXETIL 100 MG PO TABS: 100.0000 mg | ORAL_TABLET | Freq: Two times a day (BID) | ORAL | 0 refills | Status: AC

## 2017-10-15 NOTE — Care Management (Signed)
Discharge to home today per Dr. Elisabeth PigeonVachhani. Will arrange rescue for transportation. [facility] Gwenette GreetBrenda S Michaiah Holsopple RN MSN CCM Care Management [phone]

## 2017-10-15 NOTE — Progress Notes (Signed)
Inpatient Diabetes Program Recommendations  AACE/ADA: New Consensus Statement on Inpatient Glycemic Control (2015)  Target Ranges:  Prepandial:   less than 140 mg/dL      Peak postprandial:   less than 180 mg/dL (1-2 hours)      Critically ill patients:  140 - 180 mg/dL   Lab Results  Component Value Date   GLUCAP 203 (H) 10/15/2017   HGBA1C 8.8 (H) 07/11/2017    Review of Glycemic Control  Results for [name], Alexandra L (MRN [id number]) as of 10/15/2017 11:20  Ref. Range 10/14/2017 08:16 10/14/2017 11:57 10/14/2017 16:30 10/14/2017 20:53 10/15/2017 07:47  Glucose-Capillary Latest Ref Range: 65 - 99 mg/dL 409256 (H) 811302 (H) 914118 (H) 259 (H) 203 (H)   Diabetes history: Type 2 Outpatient Diabetes medications: Glipizide 5mg  bid, Januvia 25mg  qday Current orders for Inpatient glycemic control: Glipizide 5mg  bid, Tradgenta 5mg  qday, Novolog 0-9 units tid, Novolog 0-5 units qhs  Inpatient Diabetes Program Recommendations: Consider d/c Glipizide (age, decreased renal function).  Consider Lantus 10 units qhs beginning tonight- no improvement in CBG and fasting CBG 203 mg/dl.   Text paged Dr. Elisabeth PigeonVachhani at 11:24am  Susette RacerJulie Shirely Toren, RN, [place]BA, MHA, CDE Diabetes Coordinator Inpatient Diabetes Program  [phone] (Team Pager) [phone] [facility] Office) 10/15/2017 11:23 AM

## 2017-10-15 NOTE — Care Management Important Message (Signed)
Important Message  Patient Details  Name: Alexandra English MRN: [id number] Date of Birth: 03-27-1927   Medicare Important Message Given:  Yes    Gwenette GreetBrenda S Jodean Valade, RN 10/15/2017, 7:34 AM

## 2017-10-15 NOTE — Care Management CC44 (Signed)
Condition Code 44 Documentation Completed  Patient Details  Name: Koreena L Riveron MRN: [name] Date of Birth: 1927-06-05   Condition Code 44 given:  Yes Patient signature on Condition Code 44 notice:  Yes Documentation of 2 MD's agreement:  Yes Code 44 added to claim:  Yes    Gwenette GreetBrenda S Jahnaya Branscome, RN 10/15/2017, 11:21 AM

## 2017-10-15 NOTE — Discharge Summary (Signed)
[facility] [place] NAME: Alexandra English    MR#:  [id number]  DATE OF BIRTH:  Mar 18, 1927  DATE OF ADMISSION:  10/13/2017 ADMITTING PHYSICIAN: Loletha Grayer, MD  DATE OF DISCHARGE: 10/15/2017  PRIMARY CARE PHYSICIAN: Burnard Hawthorne, FNP    ADMISSION DIAGNOSIS:  Hyperglycemia [R73.9] Urinary tract infection with hematuria, site unspecified [N39.0, R31.9] Fatigue, unspecified type [R53.83]  DISCHARGE DIAGNOSIS:  Active Problems:   Acute encephalopathy   Pressure injury of skin   UTI (urinary tract infection)  SECONDARY DIAGNOSIS:   Past Medical History:  Diagnosis Date  . Diabetes mellitus 04/2009   HgbA1c 7.1% 07/2009, Monofilament normal, Ortho pending  . Fracture    of rib  . Glaucoma    Dr. Thomasene Ripple  . Hypertension   . Sleep apnea    Per Pt. was supposed to start CPAP, however reluctant to try  . Stroke [facility])    Left frontal4/2010, Left parietal and right parietal 04/2011 , Started on Plavix and then changed to Coumadin by recommendation of Dr. Carlis Abbott  . Thyroid disease    hypothyroidism    HOSPITAL COURSE:   1. Acute encephalopathy with acute cystitis without hematuria.    Ur cx sent, also have concern of cellulitis on admission, to me her legs looks fine, no redness or swelling, so no cellulitis.    Switch to rocephin as in past her Ur cx showing resistant to Cipro. She Tolerated Rocephin in past. 2. Type 2 diabetes with hyperglycemia. Check hemoglobin A1c. Put on sliding scale. Continue oral medications. 3. Essential hypertension and bradycardia. Cut back on clonidine dose to 0.1 mg at night. Hopefully can get off this medication. 4. Hyperlipidemia unspecified on Lipitor 5. Hypothyroidism unspecified on levothyroxine  DISCHARGE CONDITIONS:   Stable.  CONSULTS OBTAINED:    DRUG ALLERGIES:   Allergies  Allergen Reactions  . Beta Adrenergic Blockers   . Cephalosporins     Taken omnicef  without a problem. [facility] 07/2017 Taken Ceftriaxone without a problem [facility] 10/30  . Lisinopril   . Macrobid [Nitrofurantoin Macrocrystal]   . Toprol Xl [Metoprolol Succinate]   . Penicillins Swelling and Rash    Has patient had a PCN reaction causing immediate rash, facial/tongue/throat swelling, SOB or lightheadedness with hypotension: Yes Has patient had a PCN reaction causing severe rash involving mucus membranes or skin necrosis: Unknown Has patient had a PCN reaction that required hospitalization: No Has patient had a PCN reaction occurring within the last 10 years: No If all of the above answers are "NO", then may proceed with Cephalosporin use.    DISCHARGE MEDICATIONS:   Current Discharge Medication List    START taking these medications   Details  cefpodoxime (VANTIN) 100 MG tablet Take 1 tablet (100 mg total) by mouth 2 (two) times daily. Qty: 8 tablet, Refills: 0      CONTINUE these medications which have NOT CHANGED   Details  amLODipine (NORVASC) 5 MG tablet Take 1 tablet (5 mg total) by mouth daily. Qty: 90 tablet, Refills: 3   Associated Diagnoses: Essential hypertension    aspirin 81 MG tablet Take 81 mg by mouth daily.      atorvastatin (LIPITOR) 40 MG tablet Take 1 tablet (40 mg total) by mouth daily. Qty: 90 tablet, Refills: 3   Associated Diagnoses: Hyperlipidemia, unspecified hyperlipidemia type    cloNIDine (CATAPRES) 0.2 MG tablet Take 1 tablet (0.2 mg total) by mouth at bedtime. Qty: 90 tablet, Refills: 3  glipiZIDE (GLUCOTROL) 5 MG tablet TAKE 1 TABLET BY MOUTH TWICE DAILY BEFORE A MEAL Qty: 60 tablet, Refills: 2    levothyroxine (SYNTHROID, LEVOTHROID) 75 MCG tablet Take 1 tablet (75 mcg total) by mouth daily. Qty: 90 tablet, Refills: 3    mirtazapine (REMERON) 30 MG tablet TAKE 1 TABLET BY MOUTH AT BEDTIME Qty: 30 tablet, Refills: 3   Associated Diagnoses: Anxiety and depression    sitaGLIPtin (JANUVIA) 25 MG tablet Take 1 tablet (25 mg total)  by mouth daily. Qty: 90 tablet, Refills: 1    Blood Glucose Monitoring Suppl (ONE TOUCH ULTRA SYSTEM KIT) W/DEVICE KIT 1 kit by Does not apply route once. Qty: 1 each, Refills: 0    glucose blood (ONE TOUCH ULTRA TEST) test strip USE ONE STRIP TO CHECK GLUCOSE TWICE DAILY E11.8 Qty: 100 each, Refills: 2    Lancets (ONETOUCH ULTRASOFT) lancets Use as instructed Qty: 100 each, Refills: 3         DISCHARGE INSTRUCTIONS:    Follow with PMD in 1-2 weeks.  If you experience worsening of your admission symptoms, develop shortness of breath, life threatening emergency, suicidal or homicidal thoughts you must seek medical attention immediately by calling 911 or calling your MD immediately  if symptoms less severe.  You Must read complete instructions/literature along with all the possible adverse reactions/side effects for all the Medicines you take and that have been prescribed to you. Take any new Medicines after you have completely understood and accept all the possible adverse reactions/side effects.   Please note  You were cared for by a hospitalist during your hospital stay. If you have any questions about your discharge medications or the care you received while you were in the hospital after you are discharged, you can call the unit and asked to speak with the hospitalist on call if the hospitalist that took care of you is not available. Once you are discharged, your primary care physician will handle any further medical issues. Please note that NO REFILLS for any discharge medications will be authorized once you are discharged, as it is imperative that you return to your primary care physician (or establish a relationship with a primary care physician if you do not have one) for your aftercare needs so that they can reassess your need for medications and monitor your lab values.    Today   CHIEF COMPLAINT:   Chief Complaint  Patient presents with  . Hyperglycemia    HISTORY OF  PRESENT ILLNESS:  Alexandra English  is a 83 y.o. female with a known history of diabetes presents to the ER with high sugars and a bladder infection and difficulty with her thoughts.  She states that her blood sugars have been high.  She has burning on urination.  She has lower abdominal pain described as an ache and a bad pain she was unable to give a scale on it.  Nothing made it better nothing made it worse.  She states he cannot think straight.  Patient states that her legs have been a little more red and bottom to her.  History obtained from patient and old chart.  Hospitalist services contacted for evaluation.  VITAL SIGNS:  Blood pressure (!) 156/57, pulse 68, temperature 97.7 F (36.5 C), temperature source Oral, resp. rate 16, height 5' 1"  (1.549 m), weight 86.2 kg (190 lb), SpO2 97 %.  I/O:   Intake/Output Summary (Last 24 hours) at 10/15/17 1423 Last data filed at 10/15/17 1409  Gross per  24 hour  Intake              720 ml  Output              600 ml  Net              120 ml    PHYSICAL EXAMINATION:   GENERAL:  83 y.o.-year-old patient lying in the bed with no acute distress.  EYES: Pupils equal, round, reactive to light and accommodation. No scleral icterus. Extraocular muscles intact.  HEENT: Head atraumatic, normocephalic. Oropharynx and nasopharynx clear.  NECK:  Supple, no jugular venous distention. No thyroid enlargement, no tenderness.  LUNGS: Normal breath sounds bilaterally, no wheezing, rales,rhonchi or crepitation. No use of accessory muscles of respiration.  CARDIOVASCULAR: S1, S2 normal. No murmurs, rubs, or gallops.  ABDOMEN: Soft, nontender, nondistended. Bowel sounds present. No organomegaly or mass.  EXTREMITIES: No pedal edema, cyanosis, or clubbing.  NEUROLOGIC: Cranial nerves II through XII are intact. Muscle strength 4/5 in all extremities. Sensation intact. Gait not checked.  PSYCHIATRIC: The patient is alert and oriented x 3.  SKIN: No obvious rash, lesion,  or ulcer.   DATA REVIEW:   CBC  Recent Labs Lab 10/15/17 0555  WBC 9.7  HGB 12.7  HCT 38.1  PLT 252    Chemistries   Recent Labs Lab 10/15/17 0555  NA 137  K 3.5  CL 106  CO2 24  GLUCOSE 208*  BUN 18  CREATININE 1.18*  CALCIUM 8.9  AST 16  ALT 14  ALKPHOS 81  BILITOT 0.5    Cardiac Enzymes  Recent Labs Lab 10/13/17 1301  TROPONINI <0.03    Microbiology Results  Results for orders placed or performed during the hospital encounter of 10/13/17  Urine Culture     Status: Abnormal (Preliminary result)   Collection Time: 10/13/17  2:18 PM  Result Value Ref Range Status   Specimen Description URINE, RANDOM  Final   Special Requests NONE  Final   Culture >=100,000 COLONIES/mL ESCHERICHIA COLI (A)  Final   Report Status PENDING  Incomplete  Culture, blood (routine x 2)     Status: None (Preliminary result)   Collection Time: 10/13/17  3:35 PM  Result Value Ref Range Status   Specimen Description BLOOD LEFT FOREARM  Final   Special Requests   Final    BOTTLES DRAWN AEROBIC AND ANAEROBIC Blood Culture results may not be optimal due to an excessive volume of blood received in culture bottles   Culture NO GROWTH 2 DAYS  Final   Report Status PENDING  Incomplete  Culture, blood (routine x 2)     Status: None (Preliminary result)   Collection Time: 10/13/17  3:36 PM  Result Value Ref Range Status   Specimen Description BLOOD RIGHT FOREARM  Final   Special Requests   Final    BOTTLES DRAWN AEROBIC AND ANAEROBIC Blood Culture adequate volume   Culture NO GROWTH 2 DAYS  Final   Report Status PENDING  Incomplete  MRSA PCR Screening     Status: None   Collection Time: 10/13/17  6:45 PM  Result Value Ref Range Status   MRSA by PCR NEGATIVE NEGATIVE Final    Comment:        The GeneXpert MRSA Assay (FDA approved for NASAL specimens only), is one component of a comprehensive MRSA colonization surveillance program. It is not intended to diagnose MRSA infection  nor to guide or monitor treatment for MRSA infections.  RADIOLOGY:  No results found.  EKG:   Orders placed or performed during the hospital encounter of 10/13/17  . EKG 12-Lead  . EKG 12-Lead  . ED EKG  . ED EKG    Management plans discussed with the patient, family and they are in agreement.  CODE STATUS:     Code Status Orders        Start     Ordered   10/13/17 1551  Full code  Continuous     10/13/17 1550    Code Status History    Date Active Date Inactive Code Status Order ID Comments User Context   08/14/2017  7:53 PM 08/18/2017  8:18 PM Full Code [id number]  Nicholes Mango, MD Inpatient   10/19/2016  7:02 AM 10/20/2016  5:23 PM Full Code [id number]  Harrie Foreman, MD Inpatient      TOTAL TIME TAKING CARE OF THIS PATIENT: 35 minutes.    Vaughan Basta M.D on 10/15/2017 at 2:23 PM  Between 7am to 6pm - Pager - [phone]  After 6pm go to [email or web address] - password [email or web address] [facility]  Office  [phone]  CC: Primary care physician; Burnard Hawthorne, FNP   Note: This dictation was prepared with Dragon dictation along with smaller phrase technology. Any transcriptional errors that result from this process are unintentional.

## 2017-10-15 NOTE — Care Management Note (Signed)
Case Management Note  Patient Details  Name: Alexandra KempBetty L Fryman MRN: [id number] Date of Birth: November 01, 1927  Subjective/Objective:  Admitted to [place] [facility] with the diagnosis of acute encephalopathy. [facility]. Son is barry [phone]). Prescriptions are filled ar [facility] [address], [facility] in the past. [facility] 08/[facility]/17 x 3 weeks. [facility] in the past. Motorized wheelchair and rolling walker in the home. Takes care of all basic activities of daily living herself, doesn't drive. Friends/family help with errands. Goes to the dining room for meals. [facility] last 08/14/17/ Good appetite States will need rescue unit for transportation.              Action/Plan: Physical therapy evaluation completed. Recommending home with home health and therapy. Would like [facility] again. Will arrange rescue unit for transpotation   Expected Discharge Date:  10/15/17               Expected Discharge Plan:     In-House Referral:     Discharge planning Services     Post Acute Care Choice:   yes Choice offered to:   Ms. Rocco Sereneamick  DME Arranged:    DME Agency:     HH Arranged:   yes HH Agency:   [facility]  Status of Service:     If discussed at [employer] Length of Stay Meetings, dates discussed:    Additional Comments:  Gwenette GreetBrenda S Dois Juarbe, RN MSN CCM Care Management [phone] 10/15/2017, 9:50 AM

## 2017-10-16 ENCOUNTER — Telehealth: Payer: Self-pay | Admitting: Family

## 2017-10-16 LAB — URINE CULTURE: Culture: >=100000 — AB

## 2017-10-16 NOTE — Telephone Encounter (Signed)
Patient says she can only come on Tuesday for HFU, due to transportation , PCP has no appointment on Tuesday in the allotted time for HFU, please advise.

## 2017-10-16 NOTE — Telephone Encounter (Signed)
Transition Care Management Follow-up Telephone Call  How have you been since you were released from the hospital? Feeling better but my back is still bothering me.   Do you understand why you were in the hospital? Yes   Do you understand the discharge instrcutions? Yes  Items Reviewed:  Medications reviewed: Yes  Allergies reviewed: Yes  Dietary changes reviewed: Yes  Referrals reviewed: Yes   Functional Questionnaire:   Activities of Daily Living (ADLs):   She states they are independent in the following: Independent in feeding and clothing. States they require assistance with the following: Needs assist with shower.   Any transportation issues/concerns?: Not at this time, can travel by facility bus to appointment.   Any patient concerns? No   Confirmed importance and date/time of follow-up visits scheduled: Yes   Confirmed with patient if condition begins to worsen call PCP or go to the ER.  Patient was given the Call-a-Nurse line [phone]: Yes

## 2017-10-16 NOTE — Telephone Encounter (Signed)
You may put her in any 30 min slot

## 2017-10-16 NOTE — Telephone Encounter (Signed)
HFU Tuesday 9.30

## 2017-10-16 NOTE — Telephone Encounter (Signed)
Patient has appointment Tuesday.  Will discuss at appointment.

## 2017-10-17 ENCOUNTER — Telehealth: Payer: Self-pay | Admitting: Family

## 2017-10-17 MED ORDER — NITROFURANTOIN MONOHYD MACRO 100 MG PO CAPS: 100.0000 mg | ORAL_CAPSULE | Freq: Two times a day (BID) | ORAL | 0 refills | Status: DC

## 2017-10-17 NOTE — Telephone Encounter (Signed)
Patient says she is not aware of ever taking Macrobid or having reaction to this medication. Patient also says that she is not able to pick up any medications until Tuesday because she does not have a way to do so with her son being of town.

## 2017-10-17 NOTE — Telephone Encounter (Signed)
[facility]. Called in to [facility] and made patient aware.

## 2017-10-17 NOTE — Telephone Encounter (Signed)
Patient was given vantin and is allergic to penicillin. Patient has not taken the meds. Pharmacy would not fill it.

## 2017-10-17 NOTE — Telephone Encounter (Signed)
Please advise 

## 2017-10-17 NOTE — Telephone Encounter (Signed)
Copied from CRM #[id number]. Topic: Inquiry >> Oct 17, 2017  2:43 PM Alexander BergeronBarksdale, Harvey B wrote: Reason for CRM: PT called in b/c a medicine she is prescribed has PENICILLIN and the pharmacy said that she couldn't take it, and she wants to know if she can have a substitute medicine, please contact PT or have the pharmacy contact PT when a decision has been made, [facility] and said they dont get phone calls after 12

## 2017-10-17 NOTE — Telephone Encounter (Signed)
[facility],  Please call that  [facility] and hve them deliver it

## 2017-10-17 NOTE — Telephone Encounter (Signed)
The medication was NOT prescribed by us,  It was by the hospitalist. Alexandra FiremanNiether one of us has seen her for the infection they are treating .  It should have been referred to the prescribing doctor . She has multiple allergies that prevent me from treating her without more info  Such as:  What is the reaction to PCN? What is her reaction to Macorbid ?

## 2017-10-18 LAB — CULTURE, BLOOD (ROUTINE X 2)
Culture: NO GROWTH
Culture: NO GROWTH
Special Requests: ADEQUATE

## 2017-10-20 NOTE — Progress Notes (Deleted)
Subjective:    Patient ID: BRIAN KOCOUREK, female    DOB: [birth year], 83 y.o.   MRN: [id number]  CC: NADINA FOMBY is a 83 y.o. female who presents today for follow up.   HPI: Patient admitted 09/2017 and discharge 2 days later 10/31.  Admission diagnoses hypoglycemia, UTI, fatigue.  Acute encephalopgy with acute cystitis. Sent home on cefpodoxime    HISTORY:  Past Medical History:  Diagnosis Date  . Diabetes mellitus 04/2009   HgbA1c 7.1% 07/2009, Monofilament normal, Ortho pending  . Fracture    of rib  . Glaucoma    Dr. Thomasene Ripple  . Hypertension   . Sleep apnea    Per Pt. was supposed to start CPAP, however reluctant to try  . Stroke [facility])    Left frontal4/2010, Left parietal and right parietal 04/2011 , Started on Plavix and then changed to Coumadin by recommendation of Dr. Carlis Abbott  . Thyroid disease    hypothyroidism   Past Surgical History:  Procedure Laterality Date  . orthopedic surgeon     Dr. Latanya Maudlin  . TOTAL ABDOMINAL HYSTERECTOMY    . TOTAL SHOULDER REPLACEMENT Right   . WRIST SURGERY Left    Family History  Problem Relation Age of Onset  . CAD Mother   . CAD Father   . Diabetes Mellitus I Brother   . Diabetes Mellitus II Maternal Grandmother     Allergies: Beta adrenergic blockers; Cephalosporins; Lisinopril; Macrobid [nitrofurantoin macrocrystal]; Toprol xl [metoprolol succinate]; and Penicillins Current Outpatient Medications on File Prior to Visit  Medication Sig Dispense Refill  . amLODipine (NORVASC) 5 MG tablet Take 1 tablet (5 mg total) by mouth daily. 90 tablet 3  . aspirin 81 MG tablet Take 81 mg by mouth daily.      Marland Kitchen atorvastatin (LIPITOR) 40 MG tablet Take 1 tablet (40 mg total) by mouth daily. 90 tablet 3  . Blood Glucose Monitoring Suppl (ONE TOUCH ULTRA SYSTEM KIT) W/DEVICE KIT 1 kit by Does not apply route once. 1 each 0  . cloNIDine (CATAPRES) 0.2 MG tablet Take 1 tablet (0.2 mg total) by mouth at bedtime. 90 tablet 3  . glipiZIDE  (GLUCOTROL) 5 MG tablet TAKE 1 TABLET BY MOUTH TWICE DAILY BEFORE A MEAL 60 tablet 2  . glucose blood (ONE TOUCH ULTRA TEST) test strip USE ONE STRIP TO CHECK GLUCOSE TWICE DAILY E11.8 100 each 2  . Lancets (ONETOUCH ULTRASOFT) lancets Use as instructed 100 each 3  . levothyroxine (SYNTHROID, LEVOTHROID) 75 MCG tablet Take 1 tablet (75 mcg total) by mouth daily. 90 tablet 3  . mirtazapine (REMERON) 30 MG tablet TAKE 1 TABLET BY MOUTH AT BEDTIME 30 tablet 3  . nitrofurantoin, macrocrystal-monohydrate, (MACROBID) 100 MG capsule Take 1 capsule (100 mg total) by mouth 2 (two) times daily. 14 capsule 0  . sitaGLIPtin (JANUVIA) 25 MG tablet Take 1 tablet (25 mg total) by mouth daily. 90 tablet 1   No current facility-administered medications on file prior to visit.     Social History   Tobacco Use  . Smoking status: Never Smoker  . Smokeless tobacco: Never Used  Substance Use Topics  . Alcohol use: No  . Drug use: No    Review of Systems    Objective:    There were no vitals taken for this visit. BP Readings from Last 3 Encounters:  10/15/17 (!) 156/57  08/18/17 (!) 147/63  07/24/17 108/60   Wt Readings from Last 3 Encounters:  10/13/17 190 lb (  86.2 kg)  08/14/17 189 lb 16 oz (86.2 kg)  07/10/17 190 lb (86.2 kg)    Physical Exam     Assessment & Plan:   Problem List Items Addressed This Visit    None       I am having Crystalann L. Kattner maintain her aspirin, ONE TOUCH ULTRA SYSTEM KIT, onetouch ultrasoft, glucose blood, atorvastatin, cloNIDine, levothyroxine, amLODipine, sitaGLIPtin, glipiZIDE, mirtazapine, and nitrofurantoin (macrocrystal-monohydrate).   No orders of the defined types were placed in this encounter.   Return precautions given.   Risks, benefits, and alternatives of the medications and treatment plan prescribed today were discussed, and patient expressed understanding.   Education regarding symptom management and diagnosis given to patient on  AVS.  Continue to follow with Burnard Hawthorne, FNP for routine health maintenance.   [name] and I agreed with plan.   [name], FNP

## 2017-10-21 ENCOUNTER — Encounter: Payer: Self-pay | Admitting: Family

## 2017-10-21 ENCOUNTER — Ambulatory Visit (INDEPENDENT_AMBULATORY_CARE_PROVIDER_SITE_OTHER): Payer: Medicare Other | Admitting: Family

## 2017-10-21 VITALS — BP 116/60 | HR 76 | Temp 98.0°F | Ht 61.0 in

## 2017-10-21 DIAGNOSIS — I10 Essential (primary) hypertension: Secondary | ICD-10-CM | POA: Diagnosis not present

## 2017-10-21 DIAGNOSIS — R300 Dysuria: Secondary | ICD-10-CM

## 2017-10-21 DIAGNOSIS — E118 Type 2 diabetes mellitus with unspecified complications: Secondary | ICD-10-CM | POA: Diagnosis not present

## 2017-10-21 DIAGNOSIS — Z794 Long term (current) use of insulin: Secondary | ICD-10-CM | POA: Diagnosis not present

## 2017-10-21 LAB — BASIC METABOLIC PANEL
BUN: 17 mg/dL (ref 6–23)
CALCIUM: 9.8 mg/dL (ref 8.4–10.5)
CO2: 24 meq/L (ref 19–32)
Chloride: 101 mEq/L (ref 96–112)
Creatinine, Ser: 0.93 mg/dL (ref 0.40–1.20)
GFR: 60.48 mL/min (ref >60.00)
Glucose, Bld: 228 mg/dL — ABNORMAL HIGH (ref 70–99)
POTASSIUM: 4.2 meq/L (ref 3.5–5.1)
Sodium: 136 mEq/L (ref 135–145)

## 2017-10-21 LAB — HEMOGLOBIN A1C: HEMOGLOBIN A1C: 9.1 % — AB (ref 4.6–6.5)

## 2017-10-21 NOTE — Assessment & Plan Note (Addendum)
Reviewed hospitalization with patient. Continues to complain of dysuria, intermittent. She also complains of frequency however this may related to hyperglycemia. Pending urine studies today.

## 2017-10-21 NOTE — Progress Notes (Signed)
Subjective:    Patient ID: Alexandra English, female    DOB: 06-17-1927, 83 y.o.   MRN: [id number]  CC: [name] is a 83 y.o. female who presents today for follow up.   HPI: Patient admitted 09/2017 and discharge 2 days later 10/31.  Admission diagnoses hypoglycemia, UTI, fatigue.  Acute encephalopgy with acute cystitis. Sent home on cefpodoxime, has a couple of more days.   Some burning when urinates, and frequency.  Otherwise feels well. She is sad about relationship with son and tearful over this. Upset as son 'will have nothing to do with her.' Son lives in [place].   DM- didn't test blood sugar this morning.' thinks running high.' Taking glipizide, januvia. Worried about cost of [place]. No hypoglycemic episodes.        HISTORY:  Past Medical History:  Diagnosis Date  . Diabetes mellitus 04/2009   HgbA1c 7.1% 07/2009, Monofilament normal, Ortho pending  . Fracture    of rib  . Glaucoma    Dr. Thomasene Ripple  . Hypertension   . Sleep apnea    Per Pt. was supposed to start CPAP, however reluctant to try  . Stroke [facility])    Left frontal4/2010, Left parietal and right parietal 04/2011 , Started on Plavix and then changed to Coumadin by recommendation of Dr. Carlis Abbott  . Thyroid disease    hypothyroidism   Past Surgical History:  Procedure Laterality Date  . orthopedic surgeon     Dr. Latanya Maudlin  . TOTAL ABDOMINAL HYSTERECTOMY    . TOTAL SHOULDER REPLACEMENT Right   . WRIST SURGERY Left    Family History  Problem Relation Age of Onset  . CAD Mother   . CAD Father   . Diabetes Mellitus I Brother   . Diabetes Mellitus II Maternal Grandmother     Allergies: Beta adrenergic blockers; Cephalosporins; Lisinopril; Macrobid [nitrofurantoin macrocrystal]; Toprol xl [metoprolol succinate]; and Penicillins Current Outpatient Medications on File Prior to Visit  Medication Sig Dispense Refill  . amLODipine (NORVASC) 5 MG tablet Take 1 tablet (5 mg total) by mouth daily. 90 tablet 3    . aspirin 81 MG tablet Take 81 mg by mouth daily.      Marland Kitchen atorvastatin (LIPITOR) 40 MG tablet Take 1 tablet (40 mg total) by mouth daily. 90 tablet 3  . Blood Glucose Monitoring Suppl (ONE TOUCH ULTRA SYSTEM KIT) W/DEVICE KIT 1 kit by Does not apply route once. 1 each 0  . cloNIDine (CATAPRES) 0.2 MG tablet Take 1 tablet (0.2 mg total) by mouth at bedtime. 90 tablet 3  . glipiZIDE (GLUCOTROL) 5 MG tablet TAKE 1 TABLET BY MOUTH TWICE DAILY BEFORE A MEAL 60 tablet 2  . glucose blood (ONE TOUCH ULTRA TEST) test strip USE ONE STRIP TO CHECK GLUCOSE TWICE DAILY E11.8 100 each 2  . Lancets (ONETOUCH ULTRASOFT) lancets Use as instructed 100 each 3  . levothyroxine (SYNTHROID, LEVOTHROID) 75 MCG tablet Take 1 tablet (75 mcg total) by mouth daily. 90 tablet 3  . mirtazapine (REMERON) 30 MG tablet TAKE 1 TABLET BY MOUTH AT BEDTIME 30 tablet 3  . sitaGLIPtin (JANUVIA) 25 MG tablet Take 1 tablet (25 mg total) by mouth daily. 90 tablet 1   No current facility-administered medications on file prior to visit.     Social History   Tobacco Use  . Smoking status: Never Smoker  . Smokeless tobacco: Never Used  Substance Use Topics  . Alcohol use: No  . Drug use: No  Review of Systems  Constitutional: Negative for chills and fever.  Respiratory: Negative for cough.   Cardiovascular: Negative for chest pain and palpitations.  Gastrointestinal: Negative for nausea and vomiting.  Genitourinary: Positive for dysuria and frequency. Negative for difficulty urinating, hematuria and urgency.      Objective:    BP 116/60   Pulse 76   Temp 98 F (36.7 C) (Oral)   Ht 5' 1"  (1.549 m)   SpO2 98%   BMI 35.90 kg/m  BP Readings from Last 3 Encounters:  10/21/17 116/60  10/15/17 (!) 156/57  08/18/17 (!) 147/63   Wt Readings from Last 3 Encounters:  10/13/17 190 lb (86.2 kg)  08/14/17 189 lb 16 oz (86.2 kg)  07/10/17 190 lb (86.2 kg)    Physical Exam  Constitutional: She appears well-developed and  well-nourished.  Eyes: Conjunctivae are normal.  Cardiovascular: Normal rate, regular rhythm, normal heart sounds and normal pulses.  Pulmonary/Chest: Effort normal and breath sounds normal. She has no wheezes. She has no rhonchi. She has no rales.  Neurological: She is alert.  Skin: Skin is warm and dry.  Psychiatric: She has a normal mood and affect. Her speech is normal and behavior is normal. Thought content normal.  Vitals reviewed.      Assessment & Plan:   Problem List Items Addressed This Visit      Cardiovascular and Mediastinum   Hypertension - Primary    At goal today. Continue current regimen.       Relevant Orders   Basic metabolic panel     Endocrine   DM (diabetes mellitus), type 2 with complications ([place])    Patient did not bring in  blood sugar log today. She is concerned about cost of Januvia; we are looking into this for patient. Pending A1c and will titrate medications from there. Of note, she is very resistant to starting insulin      Relevant Orders   Hemoglobin A1c     Other   Dysuria    Reviewed hospitalization with patient. Continues to complain of dysuria, intermittent. She also complains of frequency however this may related to hyperglycemia. Pending urine studies today.      Relevant Orders   Urine Culture   Urinalysis, Routine w reflex microscopic       I have discontinued Landon L. Noto's nitrofurantoin (macrocrystal-monohydrate). I am also having her maintain her aspirin, ONE TOUCH ULTRA SYSTEM KIT, onetouch ultrasoft, glucose blood, atorvastatin, cloNIDine, levothyroxine, amLODipine, sitaGLIPtin, glipiZIDE, and mirtazapine.   No orders of the defined types were placed in this encounter.   Return precautions given.   Risks, benefits, and alternatives of the medications and treatment plan prescribed today were discussed, and patient expressed understanding.   Education regarding symptom management and diagnosis given to patient on  AVS.  Continue to follow with Burnard Hawthorne, FNP for routine health maintenance.   [name] and I agreed with plan.   [name], FNP

## 2017-10-21 NOTE — Patient Instructions (Signed)
Labs and urine  Please let me know if you need anything at all

## 2017-10-21 NOTE — Assessment & Plan Note (Addendum)
Patient did not bring in  blood sugar log today. She is concerned about cost of Januvia; we are looking into this for patient. Pending A1c and will titrate medications from there. Of note, she is very resistant to starting insulin

## 2017-10-21 NOTE — Assessment & Plan Note (Signed)
At goal today. Continue current regimen.  

## 2017-10-23 ENCOUNTER — Telehealth: Payer: Self-pay | Admitting: Internal Medicine

## 2017-10-23 DIAGNOSIS — E1165 Type 2 diabetes mellitus with hyperglycemia: Secondary | ICD-10-CM | POA: Diagnosis not present

## 2017-10-23 DIAGNOSIS — Z8673 Personal history of transient ischemic attack (TIA), and cerebral infarction without residual deficits: Secondary | ICD-10-CM | POA: Diagnosis not present

## 2017-10-23 DIAGNOSIS — I10 Essential (primary) hypertension: Secondary | ICD-10-CM | POA: Diagnosis not present

## 2017-10-23 DIAGNOSIS — H409 Unspecified glaucoma: Secondary | ICD-10-CM | POA: Diagnosis not present

## 2017-10-23 DIAGNOSIS — E785 Hyperlipidemia, unspecified: Secondary | ICD-10-CM | POA: Diagnosis not present

## 2017-10-23 DIAGNOSIS — E039 Hypothyroidism, unspecified: Secondary | ICD-10-CM | POA: Diagnosis not present

## 2017-10-23 DIAGNOSIS — G4730 Sleep apnea, unspecified: Secondary | ICD-10-CM | POA: Diagnosis not present

## 2017-10-23 DIAGNOSIS — Z7984 Long term (current) use of oral hypoglycemic drugs: Secondary | ICD-10-CM | POA: Diagnosis not present

## 2017-10-23 DIAGNOSIS — Z96611 Presence of right artificial shoulder joint: Secondary | ICD-10-CM | POA: Diagnosis not present

## 2017-10-23 DIAGNOSIS — Z7982 Long term (current) use of aspirin: Secondary | ICD-10-CM | POA: Diagnosis not present

## 2017-10-23 DIAGNOSIS — R2689 Other abnormalities of gait and mobility: Secondary | ICD-10-CM | POA: Diagnosis not present

## 2017-10-23 NOTE — Telephone Encounter (Signed)
Yes please

## 2017-10-23 NOTE — Telephone Encounter (Signed)
Copied from CRM #[id number]. Topic: Quick Communication - See Telephone Encounter >> Oct 23, 2017  8:37 AM Guinevere FerrariMorris, Keileigh Vahey E, NT wrote: CRM for notification. See Telephone encounter for: [facility] for a new verbal order start date for today for PT.  10/23/17.

## 2017-10-23 NOTE — Telephone Encounter (Signed)
Left message for Alexandra English to call back.  

## 2017-10-23 NOTE — Telephone Encounter (Signed)
Ok to give verbal to home health?

## 2017-10-23 NOTE — Telephone Encounter (Signed)
Verbal given to KIM ok to try again to reach patient for home health.

## 2017-10-28 ENCOUNTER — Other Ambulatory Visit (INDEPENDENT_AMBULATORY_CARE_PROVIDER_SITE_OTHER): Payer: Medicare Other

## 2017-10-28 DIAGNOSIS — R300 Dysuria: Secondary | ICD-10-CM

## 2017-10-28 LAB — URINALYSIS, ROUTINE W REFLEX MICROSCOPIC
Bilirubin Urine: NEGATIVE
Hgb urine dipstick: NEGATIVE
Ketones, ur: NEGATIVE
Leukocytes, UA: NEGATIVE
Nitrite: NEGATIVE
Specific Gravity, Urine: <=1.005 — AB
Total Protein, Urine: NEGATIVE
Urine Glucose: NEGATIVE
Urobilinogen, UA: 0.2
WBC, UA: NONE SEEN — AB
pH: 6.5 (ref 5.0–8.0)

## 2017-10-28 NOTE — Progress Notes (Signed)
Patient has been informed.

## 2017-10-29 LAB — URINE CULTURE
MICRO NUMBER: 81277695
Result:: NO GROWTH
SPECIMEN QUALITY: ADEQUATE

## 2017-10-30 DIAGNOSIS — Z7982 Long term (current) use of aspirin: Secondary | ICD-10-CM | POA: Diagnosis not present

## 2017-10-30 DIAGNOSIS — I10 Essential (primary) hypertension: Secondary | ICD-10-CM | POA: Diagnosis not present

## 2017-10-30 DIAGNOSIS — Z7984 Long term (current) use of oral hypoglycemic drugs: Secondary | ICD-10-CM | POA: Diagnosis not present

## 2017-10-30 DIAGNOSIS — E785 Hyperlipidemia, unspecified: Secondary | ICD-10-CM | POA: Diagnosis not present

## 2017-10-30 DIAGNOSIS — H409 Unspecified glaucoma: Secondary | ICD-10-CM | POA: Diagnosis not present

## 2017-10-30 DIAGNOSIS — Z8673 Personal history of transient ischemic attack (TIA), and cerebral infarction without residual deficits: Secondary | ICD-10-CM | POA: Diagnosis not present

## 2017-10-30 DIAGNOSIS — E1165 Type 2 diabetes mellitus with hyperglycemia: Secondary | ICD-10-CM | POA: Diagnosis not present

## 2017-10-30 DIAGNOSIS — Z96611 Presence of right artificial shoulder joint: Secondary | ICD-10-CM | POA: Diagnosis not present

## 2017-10-30 DIAGNOSIS — R2689 Other abnormalities of gait and mobility: Secondary | ICD-10-CM | POA: Diagnosis not present

## 2017-10-30 DIAGNOSIS — G4730 Sleep apnea, unspecified: Secondary | ICD-10-CM | POA: Diagnosis not present

## 2017-10-30 DIAGNOSIS — E039 Hypothyroidism, unspecified: Secondary | ICD-10-CM | POA: Diagnosis not present

## 2017-10-31 ENCOUNTER — Ambulatory Visit: Payer: Self-pay | Admitting: *Deleted

## 2017-10-31 DIAGNOSIS — Z7982 Long term (current) use of aspirin: Secondary | ICD-10-CM | POA: Diagnosis not present

## 2017-10-31 DIAGNOSIS — Z8673 Personal history of transient ischemic attack (TIA), and cerebral infarction without residual deficits: Secondary | ICD-10-CM | POA: Diagnosis not present

## 2017-10-31 DIAGNOSIS — E785 Hyperlipidemia, unspecified: Secondary | ICD-10-CM | POA: Diagnosis not present

## 2017-10-31 DIAGNOSIS — E1165 Type 2 diabetes mellitus with hyperglycemia: Secondary | ICD-10-CM | POA: Diagnosis not present

## 2017-10-31 DIAGNOSIS — E039 Hypothyroidism, unspecified: Secondary | ICD-10-CM | POA: Diagnosis not present

## 2017-10-31 DIAGNOSIS — Z96611 Presence of right artificial shoulder joint: Secondary | ICD-10-CM | POA: Diagnosis not present

## 2017-10-31 DIAGNOSIS — R2689 Other abnormalities of gait and mobility: Secondary | ICD-10-CM | POA: Diagnosis not present

## 2017-10-31 DIAGNOSIS — H409 Unspecified glaucoma: Secondary | ICD-10-CM | POA: Diagnosis not present

## 2017-10-31 DIAGNOSIS — Z7984 Long term (current) use of oral hypoglycemic drugs: Secondary | ICD-10-CM | POA: Diagnosis not present

## 2017-10-31 DIAGNOSIS — I10 Essential (primary) hypertension: Secondary | ICD-10-CM | POA: Diagnosis not present

## 2017-10-31 DIAGNOSIS — G4730 Sleep apnea, unspecified: Secondary | ICD-10-CM | POA: Diagnosis not present

## 2017-10-31 NOTE — Telephone Encounter (Signed)
NOT WITHOUT A SPECIMEN, BECAUSE HER LAST URINE WAS NEGATIVE FOR INFECTION AND SHE TOOK AN ANTIBIOTIC

## 2017-10-31 NOTE — Telephone Encounter (Signed)
Patient is aware and says if symptoms worsen she will call back

## 2017-10-31 NOTE — Telephone Encounter (Signed)
Pt's Physical Therapist Caryn BeeKevin called stating that pt is complaining of burning when urinating; pt also states that she has a history of recurrent UTIs; pt previously seen by Rennie PlowmanMargaret Arnett and was given an antibiotic that she finished taking last week; pt also states that she does not have a way to get to MDs  office or urgent care; conference call with Bethann Berkshirerisha placed;   Bethann Berkshirerisha will direct this information to Dr Darrick Huntsmanullo for further direction and will contact the pt on her home phone [phone]    Reason for Disposition . Age > 50 years  Answer Assessment - Initial Assessment Questions 1. SEVERITY: "How bad is the pain?"  (e.g., Scale 1-10; mild, moderate, or severe)   - MILD (1-3): complains slightly about urination hurting   - MODERATE (4-7): interferes with normal activities     - SEVERE (8-10): excruciating, unwilling or unable to urinate because of the pain      mild 2. FREQUENCY: "How many times have you had painful urination today?"      Not sure of how many times it happened today 3. PATTERN: "Is pain present every time you urinate or just sometimes?"      yes 4. ONSET: "When did the painful urination start?"      Today pt states she has chronic UTIs 5. FEVER: "Do you have a fever?" If so, ask: "What is your temperature, how was it measured, and when did it start?"     no 6. PAST UTI: "Have you had a urine infection before?" If so, ask: "When was the last time?" and "What happened that time?"      Yes, just got over taking medication  7. CAUSE: "What do you think is causing the painful urination?"  (e.g., UTI, scratch, Herpes sore)     unsure 8. OTHER SYMPTOMS: "Do you have any other symptoms?" (e.g., flank pain, vaginal discharge, genital sores, urgency, blood in urine)    no 9. PREGNANCY: "Is there any chance you are pregnant?" "When was your last menstrual period?"     n/a  Protocols used: URINATION PAIN [facility]- FEMALE-A-AH

## 2017-10-31 NOTE — Telephone Encounter (Signed)
Patient is wanting to know if we can call in something for her. Says she has no way to come to the doctor for evaluation.

## 2017-11-03 DIAGNOSIS — Z96611 Presence of right artificial shoulder joint: Secondary | ICD-10-CM | POA: Diagnosis not present

## 2017-11-03 DIAGNOSIS — Z7982 Long term (current) use of aspirin: Secondary | ICD-10-CM | POA: Diagnosis not present

## 2017-11-03 DIAGNOSIS — I10 Essential (primary) hypertension: Secondary | ICD-10-CM | POA: Diagnosis not present

## 2017-11-03 DIAGNOSIS — H409 Unspecified glaucoma: Secondary | ICD-10-CM | POA: Diagnosis not present

## 2017-11-03 DIAGNOSIS — E785 Hyperlipidemia, unspecified: Secondary | ICD-10-CM | POA: Diagnosis not present

## 2017-11-03 DIAGNOSIS — E039 Hypothyroidism, unspecified: Secondary | ICD-10-CM | POA: Diagnosis not present

## 2017-11-03 DIAGNOSIS — R2689 Other abnormalities of gait and mobility: Secondary | ICD-10-CM | POA: Diagnosis not present

## 2017-11-03 DIAGNOSIS — Z8673 Personal history of transient ischemic attack (TIA), and cerebral infarction without residual deficits: Secondary | ICD-10-CM | POA: Diagnosis not present

## 2017-11-03 DIAGNOSIS — G4730 Sleep apnea, unspecified: Secondary | ICD-10-CM | POA: Diagnosis not present

## 2017-11-03 DIAGNOSIS — Z7984 Long term (current) use of oral hypoglycemic drugs: Secondary | ICD-10-CM | POA: Diagnosis not present

## 2017-11-03 DIAGNOSIS — E1165 Type 2 diabetes mellitus with hyperglycemia: Secondary | ICD-10-CM | POA: Diagnosis not present

## 2017-11-07 ENCOUNTER — Telehealth: Payer: Self-pay | Admitting: Family

## 2017-11-07 NOTE — Telephone Encounter (Signed)
Copied from CRM [id number]. Topic: Quick Communication - See Telephone Encounter >> Nov 07, 2017  2:05 PM Rudi CocoLathan, Addaleigh Nicholls M, [place] wrote: CRM for notification. See Telephone encounter for:   11/07/17.   Pt. Needs a verbal order for her electric wheelchair through [facility] [employer] [facility]. Please give pt. A call when able to do so. [phone]

## 2017-11-10 NOTE — Telephone Encounter (Signed)
Need to know who to [facility]. I do not have a phone number for them in this message

## 2017-11-12 DIAGNOSIS — Z7982 Long term (current) use of aspirin: Secondary | ICD-10-CM | POA: Diagnosis not present

## 2017-11-12 DIAGNOSIS — Z8673 Personal history of transient ischemic attack (TIA), and cerebral infarction without residual deficits: Secondary | ICD-10-CM | POA: Diagnosis not present

## 2017-11-12 DIAGNOSIS — H409 Unspecified glaucoma: Secondary | ICD-10-CM | POA: Diagnosis not present

## 2017-11-12 DIAGNOSIS — E039 Hypothyroidism, unspecified: Secondary | ICD-10-CM | POA: Diagnosis not present

## 2017-11-12 DIAGNOSIS — Z7984 Long term (current) use of oral hypoglycemic drugs: Secondary | ICD-10-CM | POA: Diagnosis not present

## 2017-11-12 DIAGNOSIS — I10 Essential (primary) hypertension: Secondary | ICD-10-CM | POA: Diagnosis not present

## 2017-11-12 DIAGNOSIS — G4730 Sleep apnea, unspecified: Secondary | ICD-10-CM | POA: Diagnosis not present

## 2017-11-12 DIAGNOSIS — R2689 Other abnormalities of gait and mobility: Secondary | ICD-10-CM | POA: Diagnosis not present

## 2017-11-12 DIAGNOSIS — E785 Hyperlipidemia, unspecified: Secondary | ICD-10-CM | POA: Diagnosis not present

## 2017-11-12 DIAGNOSIS — E1165 Type 2 diabetes mellitus with hyperglycemia: Secondary | ICD-10-CM | POA: Diagnosis not present

## 2017-11-12 DIAGNOSIS — Z96611 Presence of right artificial shoulder joint: Secondary | ICD-10-CM | POA: Diagnosis not present

## 2017-11-18 ENCOUNTER — Other Ambulatory Visit: Payer: Self-pay | Admitting: Family

## 2017-11-18 DIAGNOSIS — E785 Hyperlipidemia, unspecified: Secondary | ICD-10-CM

## 2017-12-02 ENCOUNTER — Telehealth: Payer: Self-pay | Admitting: Family

## 2017-12-02 NOTE — Telephone Encounter (Signed)
Please give verbal orders and let me know after the fact .  Thanks

## 2017-12-02 NOTE — Telephone Encounter (Signed)
Copied from CRM [id number]. Topic: Quick Communication - See Telephone Encounter >> Dec 02, 2017  2:34 PM Landry MellowFoltz, Melissa J wrote: CRM for notification. See Telephone encounter for:   12/02/17. Mckinley called from [facility] - she is requesting verbal orders for PT and OT  For evaluation Cb # [phone] or [phone] Fax # [phone]

## 2017-12-02 NOTE — Telephone Encounter (Signed)
Please advise 

## 2017-12-03 NOTE — Telephone Encounter (Signed)
Verbal given. Pt assistant still wants orders faxed to them. Please advise.

## 2017-12-20 IMAGING — CT CT HEAD W/O CM
3 series · 15 of 47 positions shown, 18 images · non-contrast
Comparison: CT HEAD April 18, 2014

CLINICAL DATA: Trip and fall versus syncopal episode.
Hyperglycemic. Headache, posterior head pain. History of
hypertension, diabetes and strokes.

EXAM:
CT HEAD WITHOUT CONTRAST
TECHNIQUE: Contiguous axial images were obtained from the base of the skull
through the vertex without intravenous contrast.

[Series 3: head wo · axial · 0.41mm/px · z∈[-79,+46]mm · 9 of 30 slices shown, 12 images]
[im 3/30  brain]
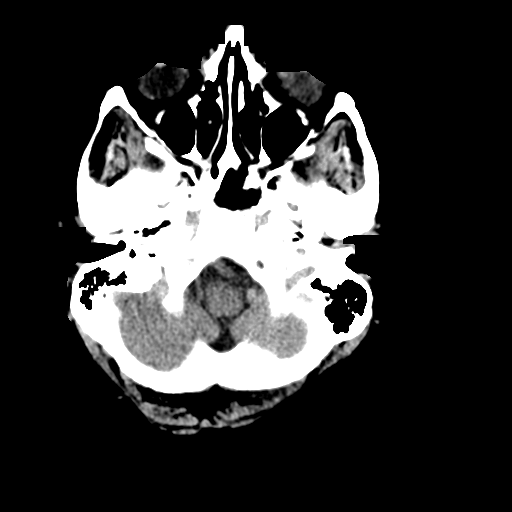
[im 3/30  bone]
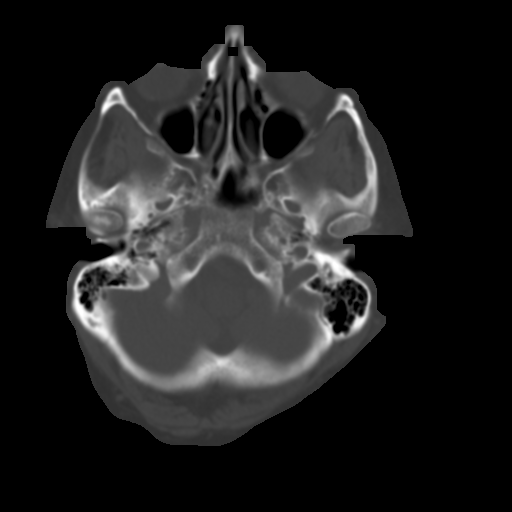
[im 6/30  brain]
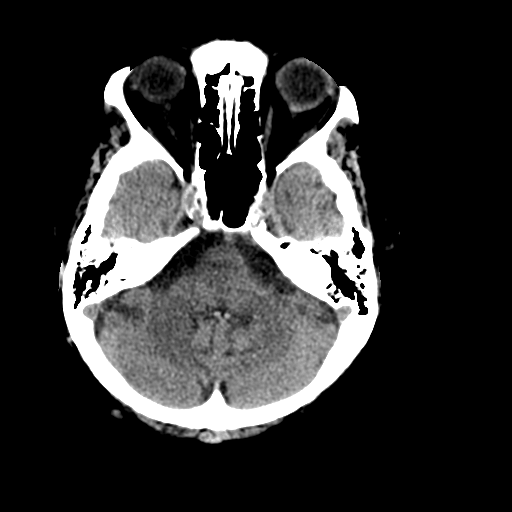
[im 9/30  brain]
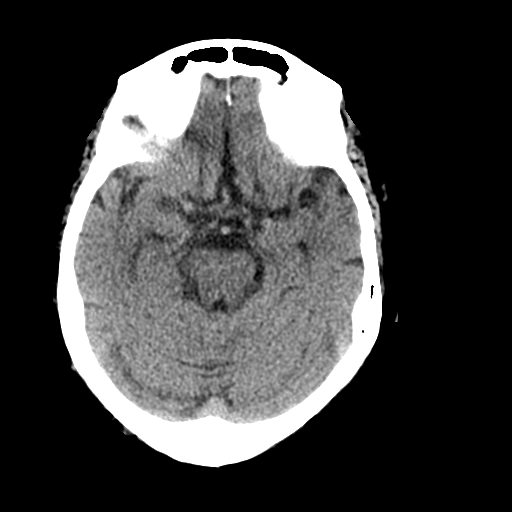
[im 12/30  brain]
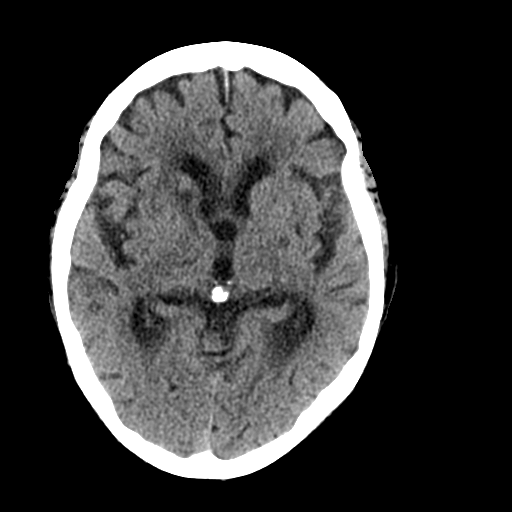
[im 16/30  brain]
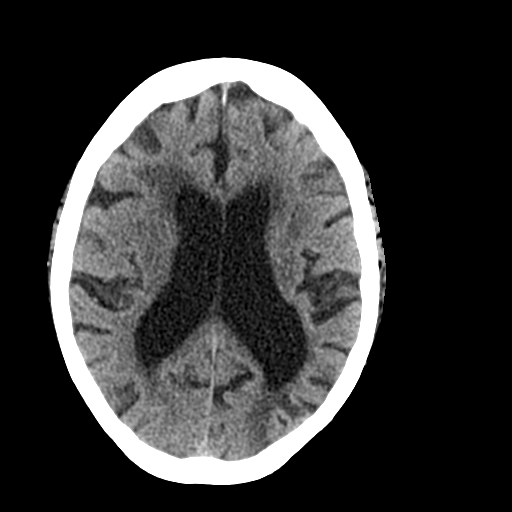
[im 16/30  bone]
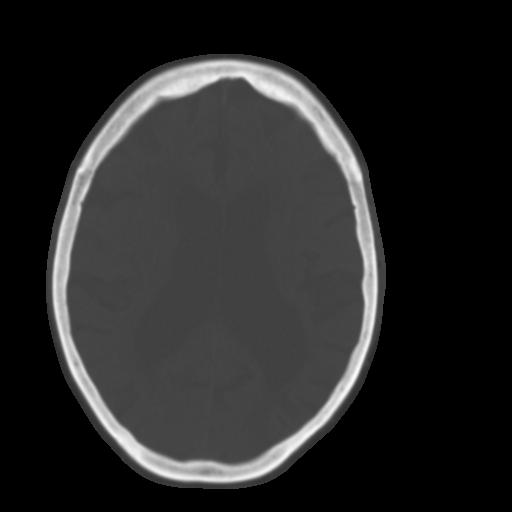
[im 19/30  brain]
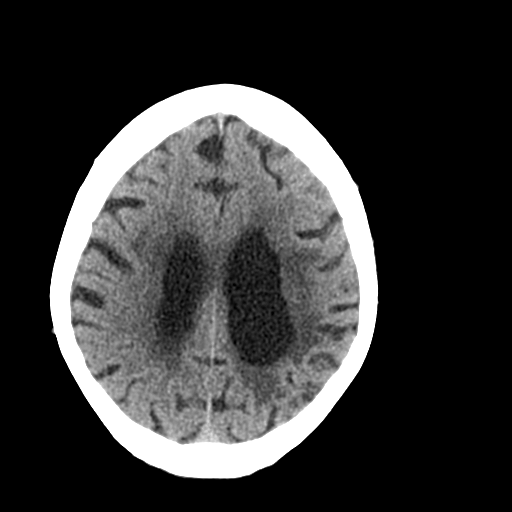
[im 22/30  brain]
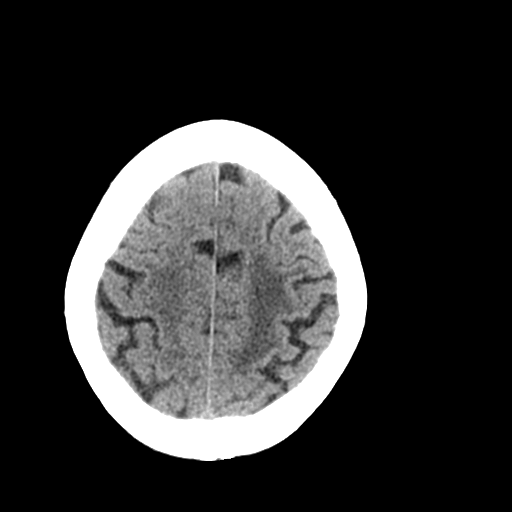
[im 25/30  brain]
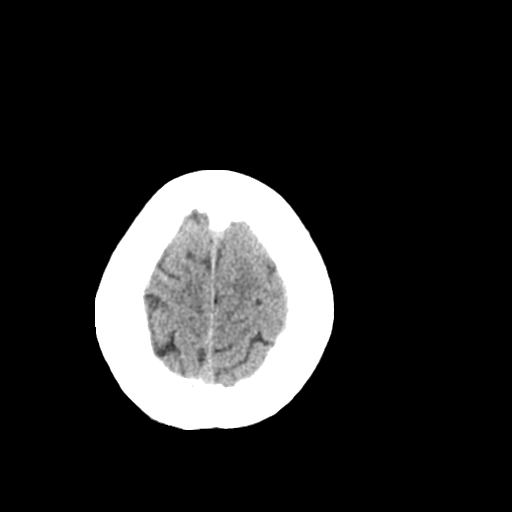
[im 28/30  brain]
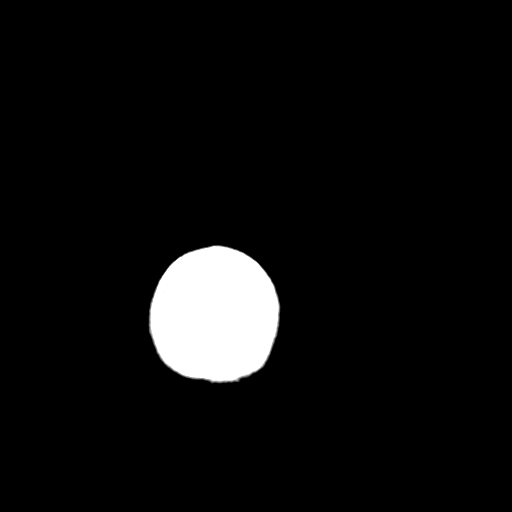
[im 28/30  bone]
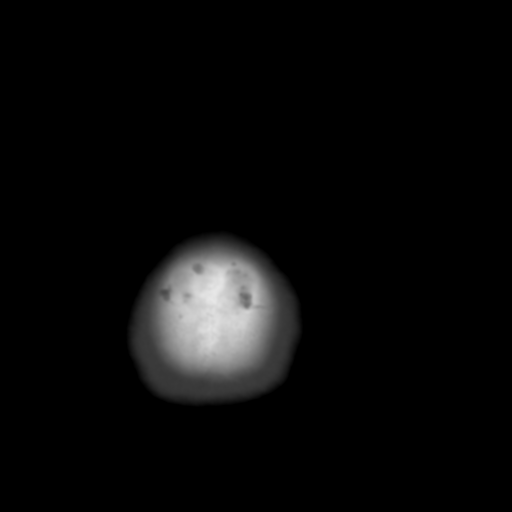

[Series 4: coronal soft tissue · coronal · 0.28mm/px · 3 of 59 slices shown]
[im 20/59  brain]
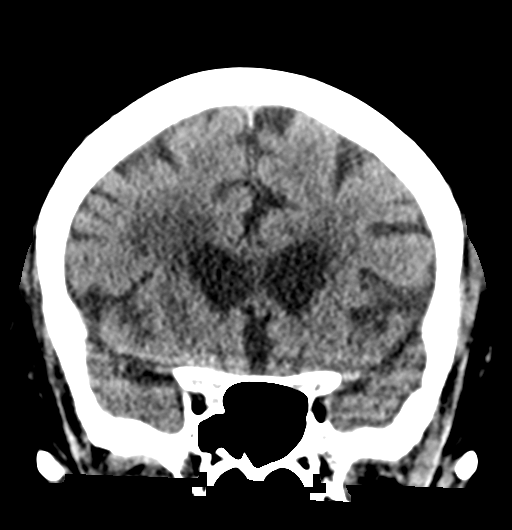
[im 26/59  brain]
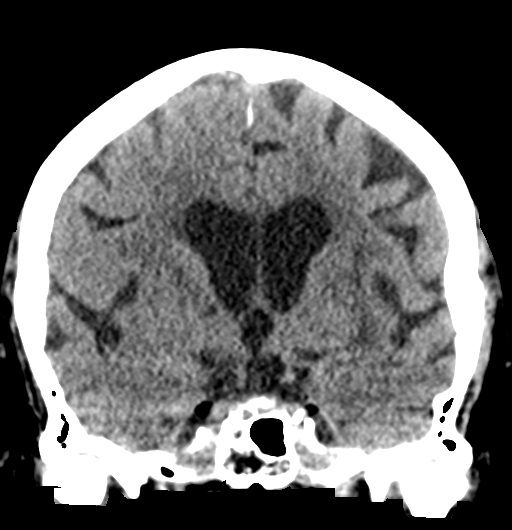
[im 33/59  brain]
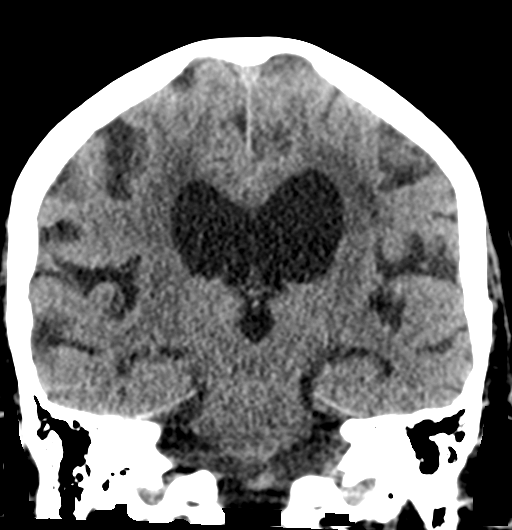

[Series 5: sagittal soft tissue · sagittal · 0.29mm/px · 3 of 46 slices shown]
[im 16/46  brain]
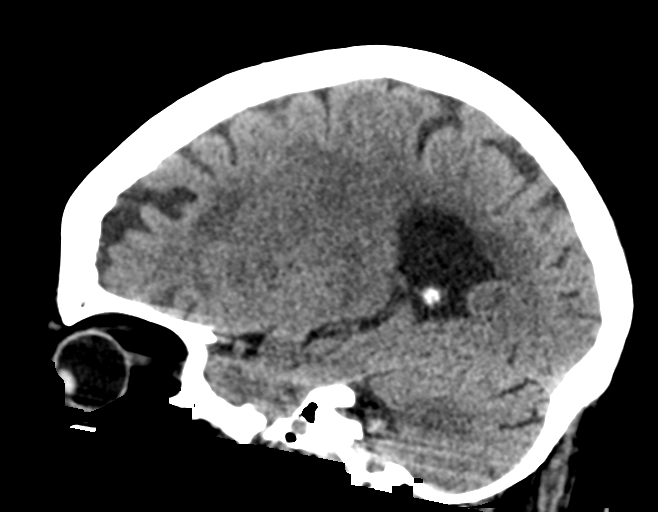
[im 23/46  brain]
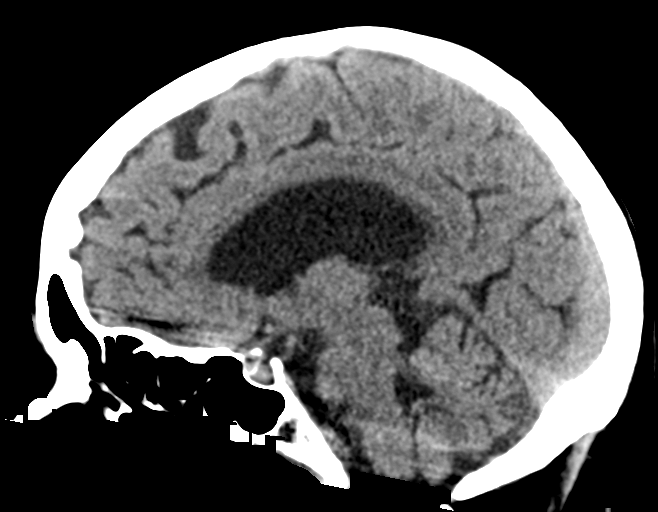
[im 31/46  brain]
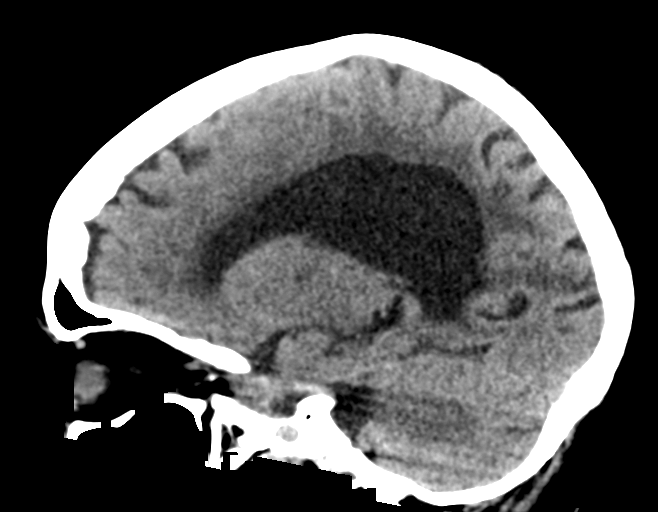

[15 of 47 positions shown; findings below may reference images not displayed]

FINDINGS: BRAIN: Unchanged moderate to severe ventriculomegaly with
disproportionate sulcal effacement at the convexities. Old bilateral
basal ganglia infarcts. LEFT parietal encephalomalacia. No
intraparenchymal hemorrhage, mass effect, midline shift or acute
large vascular territory infarcts. Confluent supratentorial white
matter hypodensities compatible with chronic small vessel ischemic
disease. No abnormal extra-axial fluid collections. Basal cisterns
are patent.

VASCULAR: Moderate calcific atherosclerosis of the carotid siphons.

SKULL: No skull fracture. No significant scalp soft tissue swelling.

SINUSES/ORBITS: The mastoid air-cells and included paranasal sinuses
are well-aerated.The included ocular globes and orbital contents are
non-suspicious.

OTHER: None.
IMPRESSION: No acute intracranial process.

Stable examination including evidence of normal pressure
hydrocephalus, old bilateral basal ganglia and LEFT parietal lobe
infarcts.

## 2017-12-20 IMAGING — CR DG CHEST 1V PORT
1 series · 1 of 1 positions shown · non-contrast
Comparison: Chest radiograph performed 05/03/2012

CLINICAL DATA: Acute onset of left-sided chest pain after losing
balance and falling. Initial encounter.

EXAM:
PORTABLE CHEST 1 VIEW

[dg chest port 1 view]
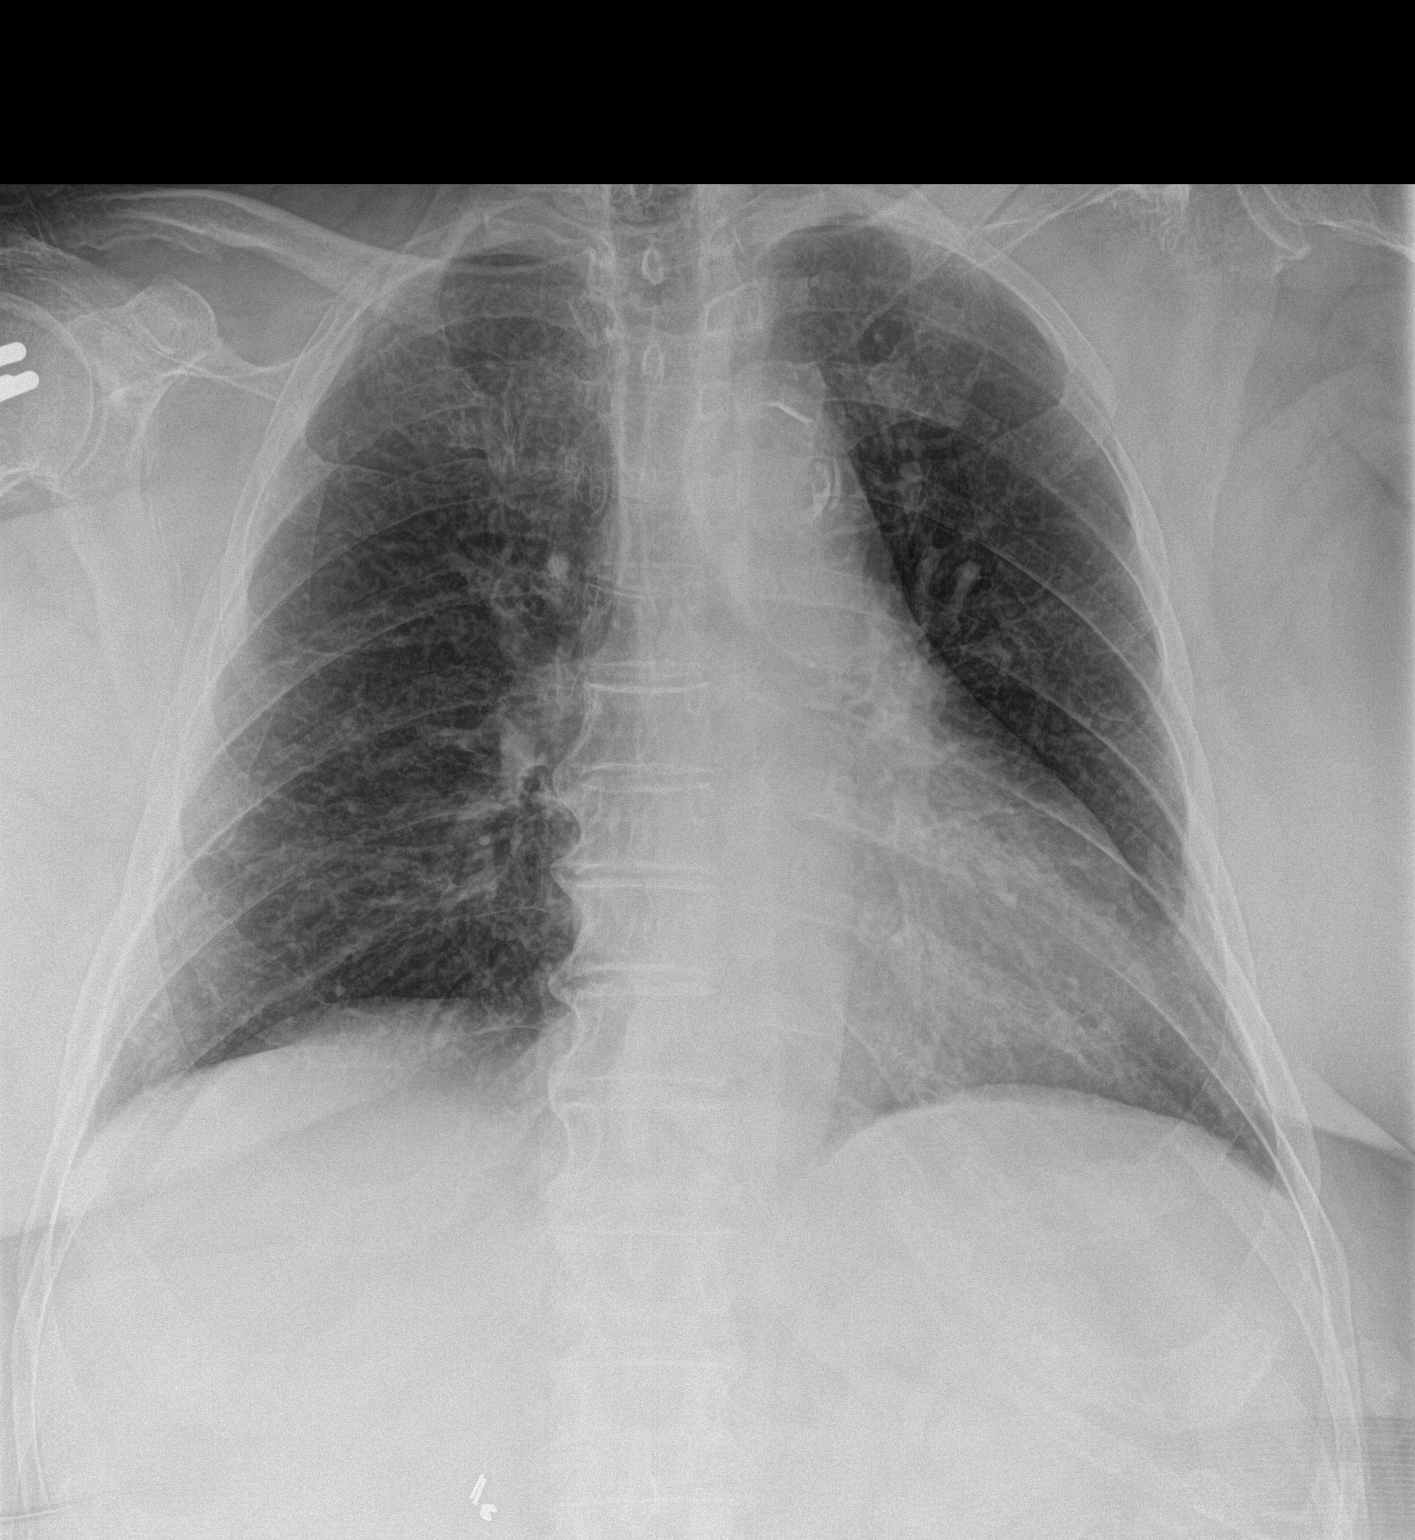

[1 of 1 positions shown; findings below may reference images not displayed]

FINDINGS: The lungs are well-aerated. Peribronchial thickening is noted.
Mildly increased see interstitial markings may be transient in
nature. There is no evidence of focal opacification, pleural
effusion or pneumothorax.

The cardiomediastinal silhouette is borderline normal in size. No
acute osseous abnormalities are seen.
IMPRESSION: 1. Peribronchial thickening noted. Mildly increased interstitial
markings may be transient in nature.
2. No displaced rib fracture seen.

## 2018-01-16 ENCOUNTER — Telehealth: Payer: Self-pay | Admitting: Family

## 2018-01-16 NOTE — Telephone Encounter (Signed)
Attempted to call patient to schedule AWV. Patient did not answer and could not leave message due to voicemail being full. Will try to contact patient at a later time. SF

## 2018-02-02 ENCOUNTER — Other Ambulatory Visit: Payer: Self-pay | Admitting: Family

## 2018-02-08 ENCOUNTER — Telehealth: Payer: Self-pay | Admitting: Family

## 2018-02-08 DIAGNOSIS — K219 Gastro-esophageal reflux disease without esophagitis: Secondary | ICD-10-CM

## 2018-02-08 NOTE — Telephone Encounter (Signed)
Call pt  Reviewing chart and see she didn't see GI last year for her acid reflux and long term use of prilosec  I have replaced referral. Please let her know

## 2018-02-16 ENCOUNTER — Other Ambulatory Visit: Payer: Self-pay | Admitting: Family

## 2018-02-16 NOTE — Telephone Encounter (Signed)
Tried calling son Gery [place]Barry on [place] , voicemail box full.

## 2018-02-17 NOTE — Telephone Encounter (Signed)
Tried calling again voice mail box full

## 2018-02-23 NOTE — Telephone Encounter (Signed)
Mail letter. 

## 2018-02-24 NOTE — Telephone Encounter (Signed)
[facility] to ask if OK to change manufactures for the levothyroxine (SYNTHROID, LEVOTHROID) 75 MCG tablet  Pharmacy states they have faxed this information and not heard anything.  Pt is out of her med now and they jsut need approval for the change   [facility] [address] - [place], [place] - [id number] [phone] (Phone) [phone] (Fax)

## 2018-02-24 NOTE — Telephone Encounter (Signed)
Letter printed and mailed.  

## 2018-03-05 ENCOUNTER — Other Ambulatory Visit: Payer: Self-pay | Admitting: Family

## 2018-03-19 ENCOUNTER — Telehealth: Payer: Self-pay | Admitting: Family

## 2018-03-19 ENCOUNTER — Other Ambulatory Visit: Payer: Self-pay | Admitting: Family

## 2018-03-19 NOTE — Telephone Encounter (Signed)
Copied from CRM [id number]. Topic: Quick Communication - See Telephone Encounter >> Mar 19, 2018  3:39 PM Trula SladeWalter, Linda F wrote: CRM for notification. See Telephone encounter for: 03/19/18. Patient would like a refill on her sitaGLIPtin (JANUVIA) 25 MG tablet medication and have it sent to [facility] [address] [place]. Patient needs the prescription sent by tomorrow because [facility].

## 2018-03-23 ENCOUNTER — Encounter: Payer: Self-pay | Admitting: *Deleted

## 2018-03-30 ENCOUNTER — Other Ambulatory Visit: Payer: Self-pay | Admitting: Family

## 2018-03-30 ENCOUNTER — Telehealth: Payer: Self-pay | Admitting: Family

## 2018-03-30 DIAGNOSIS — F329 Major depressive disorder, single episode, unspecified: Secondary | ICD-10-CM

## 2018-03-30 DIAGNOSIS — F419 Anxiety disorder, unspecified: Principal | ICD-10-CM

## 2018-03-30 NOTE — Telephone Encounter (Signed)
Copied from CRM [id number]. Topic: Quick Communication - Rx Refill/Question >> Mar 30, 2018  4:21 PM Arlyss Gandyichardson, Crystall Donaldson N, NT wrote: Medication: mirtazapine (REMERON) and cloNIDine (CATAPRES) 0.2 MG tablet  Has the patient contacted their pharmacy? Yes.   (Agent: If no, request that the patient contact the pharmacy for the refill.) Preferred Pharmacy (with phone number or street name): [facility] [address] - [place], [place] - [address] [phone] (Phone) [phone] (Fax)  Pt is out of her medication.   Agent: Please be advised that RX refills may take up to 3 business days. We ask that you follow-up with your pharmacy.

## 2018-03-31 ENCOUNTER — Other Ambulatory Visit: Payer: Self-pay | Admitting: *Deleted

## 2018-03-31 NOTE — Telephone Encounter (Signed)
Prescriptions are pending at office for provider review.

## 2018-05-01 ENCOUNTER — Other Ambulatory Visit: Payer: Self-pay | Admitting: Family

## 2018-05-01 DIAGNOSIS — I10 Essential (primary) hypertension: Secondary | ICD-10-CM

## 2018-05-01 NOTE — Telephone Encounter (Signed)
Pt needs to have medication filled today.

## 2018-05-19 ENCOUNTER — Telehealth: Payer: Self-pay | Admitting: Family

## 2018-05-19 NOTE — Telephone Encounter (Signed)
Copied from CRM [id number]. Topic: Quick Communication - See Telephone Encounter >> May 19, 2018 10:04 AM Floria RavelingStovall, Shana A wrote: CRM for notification. See Telephone encounter for: 05/19/18. Aggie Cosierheresa from [facility] -[phone] She is needing the last face to face faxed over with the order that was just sent .    Fax number [phone]

## 2018-05-19 NOTE — Telephone Encounter (Signed)
Faxed last face to face note to [facility] to the fax number listed attn.Aggie Cosierheresa

## 2018-05-22 ENCOUNTER — Telehealth: Payer: Self-pay | Admitting: Family

## 2018-05-22 NOTE — Telephone Encounter (Signed)
FYI

## 2018-05-22 NOTE — Telephone Encounter (Signed)
Copied from CRM [id number]. Topic: Quick Communication - See Telephone Encounter >> May 22, 2018  9:18 AM Eston Mouldavis, Cardelia Sassano B wrote: CRM for notification. See Telephone encounter for: 05/22/18.  Matthias HughsKecia, RN,  from [facility] called to say she received referral from [facility] , they will be going out to see pt on 11th  for PT,  she refused visit yesterday .  Kecia's contact number is  [phone]

## 2018-05-25 NOTE — Telephone Encounter (Signed)
noted 

## 2018-05-27 ENCOUNTER — Telehealth: Payer: Self-pay | Admitting: Family

## 2018-05-27 NOTE — Telephone Encounter (Signed)
Called and gave verbal [facility] [place] [facility].

## 2018-05-27 NOTE — Telephone Encounter (Signed)
Copied from CRM [id number]. Topic: Quick Communication - See Telephone Encounter >> May 27, 2018  4:29 PM Jay SchlichterWeikart, Melissa J wrote: CRM for notification. See Telephone encounter for: 05/27/18. Tresa EndoKelly from [facility]  requesting verbal order for PT  3 week 3  2 week 5 Cb is [phone]  Leave message if no answer

## 2018-05-28 ENCOUNTER — Other Ambulatory Visit: Payer: Self-pay | Admitting: Family

## 2018-05-28 DIAGNOSIS — F419 Anxiety disorder, unspecified: Principal | ICD-10-CM

## 2018-05-28 DIAGNOSIS — F329 Major depressive disorder, single episode, unspecified: Secondary | ICD-10-CM

## 2018-06-08 ENCOUNTER — Telehealth: Payer: Self-pay | Admitting: Family

## 2018-06-08 NOTE — Telephone Encounter (Signed)
Verbal given for Occupational therapy.

## 2018-06-08 NOTE — Telephone Encounter (Signed)
Copied from CRM [id number]. Topic: Quick Communication - See Telephone Encounter >> Jun 08, 2018 10:00 AM Harlan StainsWilliams-Neal, [name] wrote: [facility]Connie [facility] wanting verbal orders for occupational therapy twice a week for 6 weeks , to address upper extremity and coordination activities   Callback # [phone]

## 2018-06-15 ENCOUNTER — Telehealth: Payer: Self-pay | Admitting: Family

## 2018-06-15 NOTE — Telephone Encounter (Signed)
Please advise 

## 2018-06-15 NOTE — Telephone Encounter (Signed)
Copied from CRM [id number]. Topic: Quick Communication - See Telephone Encounter >> Jun 15, 2018  5:18 PM Raquel SarnaHayes, Alexandra G wrote: Alexandra ArenaMcKinnley PT assisstant w/ [facility] [phone]  PT assistant was with pt today.  Pt was complaining of back pain, confusion, headache, fatigue, pain and burning when going to bathroom. Pt may needing a antibiotic. Just letting office know of pt's symptoms and concerns.

## 2018-06-16 NOTE — Telephone Encounter (Signed)
Tried calling patient no answer , tried calling son, voicemail box full Spoke with Althea CharonMckinley, states patient is in Scientist, research (life sciences)powerwheelchair and [facility]. Personal wheelchair is being repaired , she is not sure if patient can come in for office visit if Zenaida Niecevan will bring patient in .  I informed Althea CharonMckinley patient would need to come in for patient  Sx presents ? Was ok Friday Althea CharonMcKinley is scheduled to see her this pm She will check with patient this am as she will be seeing her this am and call me back with report

## 2018-06-16 NOTE — Telephone Encounter (Signed)
rec pt be seen for evaluation at Emergency room   TMS

## 2018-06-16 NOTE — Telephone Encounter (Signed)
Spoke with Althea CharonMcKinley PT  she states patient is feeling better as far urinary tract symptoms, however patient isn't able  to change briefs frequently.  No transportation available from facility to get patient in for appointment.  Her personal wheelchair is being repaired and she is in motorized wheelchair. [facility] is [facility]  Yesterday we were contacted due patient was having back pain, confusion and headache , fatigue , pain and dysuria. Please advise.

## 2018-06-17 NOTE — Telephone Encounter (Signed)
Spoke with Althea CharonMcKinley PT Assistant and she again spoke with patient in regards to UTI symptoms and educated patient that she needed to be seen for symptoms.  She also educated her that there was a program available called  MD oncall who make housecalls.

## 2018-06-17 NOTE — Telephone Encounter (Signed)
Called and spoke with patient she states there is no way she can get into office due to transportation and not having her personal  wheelchair.   [facility] on[facility] that can evaluate patient. I informed patient that she could call EMS and have them transport her to hospital and she refused due to cost.   She is still experiencing symptoms of dysuria and urinary frequency.   I advised physical therapy assistant and she stated that she was due out to see patient and she inquired if she could come pick up urine specimen container and bring specimen by office , since patient is having hard time getting into office.

## 2018-06-17 NOTE — Telephone Encounter (Signed)
This needs face to face evaluation especially with all the other symptoms  rec pt go to ED or urgent care    TSM

## 2018-06-19 ENCOUNTER — Other Ambulatory Visit: Payer: Self-pay

## 2018-06-19 MED ORDER — LEVOTHYROXINE SODIUM 75 MCG PO TABS: 75.0000 ug | ORAL_TABLET | Freq: Every day | ORAL | 0 refills | Status: DC

## 2018-06-23 ENCOUNTER — Telehealth: Payer: Self-pay | Admitting: Family

## 2018-06-23 DIAGNOSIS — I639 Cerebral infarction, unspecified: Secondary | ICD-10-CM

## 2018-06-23 NOTE — Telephone Encounter (Signed)
Copied from CRM [id number]. Topic: Quick Communication - Rx Refill/Question >> Jun 23, 2018  3:24 PM Cipriano BunkerLambe, Annette S wrote: Medication: her  wheel chair motorized  broke and her insurance said she would need to have prescription to get a new one This is for use for inside use only- this needs to be stated  Since broken and can't get around  Has the patient contacted their pharmacy? Yes.   (Agent: If no, request that the patient contact the pharmacy for the refill.) (Agent: If yes, when and what did the pharmacy advise?)  Preferred Pharmacy (with phone number or street name[facility] [place] [phone] [facility]  Agent: Please be advised that RX refills may take up to 3 business days. We ask that you follow-up with your pharmacy.

## 2018-06-23 NOTE — Telephone Encounter (Signed)
[facility] # [phone] or ph# [phone] ext [phone]

## 2018-06-26 NOTE — Telephone Encounter (Signed)
Script faxed.

## 2018-06-26 NOTE — Telephone Encounter (Signed)
Believe this was faxed?

## 2018-07-17 ENCOUNTER — Other Ambulatory Visit: Payer: Self-pay | Admitting: Family

## 2018-07-17 DIAGNOSIS — F329 Major depressive disorder, single episode, unspecified: Secondary | ICD-10-CM

## 2018-07-17 DIAGNOSIS — I10 Essential (primary) hypertension: Secondary | ICD-10-CM

## 2018-07-17 DIAGNOSIS — F419 Anxiety disorder, unspecified: Principal | ICD-10-CM

## 2018-07-20 ENCOUNTER — Ambulatory Visit: Payer: Medicare Other | Admitting: Family

## 2018-07-21 MED ORDER — LEVOTHYROXINE SODIUM 75 MCG PO TABS: 75.0000 ug | ORAL_TABLET | Freq: Every day | ORAL | 0 refills | Status: DC

## 2018-07-21 MED ORDER — AMLODIPINE BESYLATE 5 MG PO TABS: 5.0000 mg | ORAL_TABLET | Freq: Every day | ORAL | 0 refills | Status: AC

## 2018-07-22 ENCOUNTER — Other Ambulatory Visit: Payer: Self-pay

## 2018-07-22 ENCOUNTER — Encounter: Payer: Self-pay | Admitting: *Deleted

## 2018-07-22 ENCOUNTER — Emergency Department: Payer: Medicare Other

## 2018-07-22 ENCOUNTER — Emergency Department
Admission: EM | Admit: 2018-07-22 | Discharge: 2018-07-23 | Disposition: A | Discharge status: Home / Self Care | Payer: Medicare Other | Source: Home / Self Care | Attending: Emergency Medicine | Admitting: Emergency Medicine

## 2018-07-22 DIAGNOSIS — F419 Anxiety disorder, unspecified: Secondary | ICD-10-CM | POA: Insufficient documentation

## 2018-07-22 DIAGNOSIS — Z7982 Long term (current) use of aspirin: Secondary | ICD-10-CM | POA: Insufficient documentation

## 2018-07-22 DIAGNOSIS — Z8673 Personal history of transient ischemic attack (TIA), and cerebral infarction without residual deficits: Secondary | ICD-10-CM | POA: Insufficient documentation

## 2018-07-22 DIAGNOSIS — F329 Major depressive disorder, single episode, unspecified: Secondary | ICD-10-CM | POA: Diagnosis not present

## 2018-07-22 DIAGNOSIS — Z79899 Other long term (current) drug therapy: Secondary | ICD-10-CM

## 2018-07-22 DIAGNOSIS — E119 Type 2 diabetes mellitus without complications: Secondary | ICD-10-CM | POA: Insufficient documentation

## 2018-07-22 DIAGNOSIS — E039 Hypothyroidism, unspecified: Secondary | ICD-10-CM

## 2018-07-22 DIAGNOSIS — N390 Urinary tract infection, site not specified: Secondary | ICD-10-CM | POA: Insufficient documentation

## 2018-07-22 DIAGNOSIS — R531 Weakness: Secondary | ICD-10-CM | POA: Diagnosis present

## 2018-07-22 DIAGNOSIS — Z96611 Presence of right artificial shoulder joint: Secondary | ICD-10-CM | POA: Diagnosis not present

## 2018-07-22 DIAGNOSIS — Z7984 Long term (current) use of oral hypoglycemic drugs: Secondary | ICD-10-CM

## 2018-07-22 DIAGNOSIS — I10 Essential (primary) hypertension: Secondary | ICD-10-CM

## 2018-07-22 DIAGNOSIS — R4182 Altered mental status, unspecified: Secondary | ICD-10-CM | POA: Diagnosis not present

## 2018-07-22 DIAGNOSIS — I2510 Atherosclerotic heart disease of native coronary artery without angina pectoris: Secondary | ICD-10-CM | POA: Diagnosis not present

## 2018-07-22 DIAGNOSIS — E785 Hyperlipidemia, unspecified: Secondary | ICD-10-CM | POA: Diagnosis not present

## 2018-07-22 DIAGNOSIS — Z66 Do not resuscitate: Secondary | ICD-10-CM | POA: Diagnosis not present

## 2018-07-22 DIAGNOSIS — R109 Unspecified abdominal pain: Secondary | ICD-10-CM | POA: Diagnosis not present

## 2018-07-22 DIAGNOSIS — G4730 Sleep apnea, unspecified: Secondary | ICD-10-CM | POA: Diagnosis not present

## 2018-07-22 LAB — CBC
HEMATOCRIT: 41.1 % (ref 35.0–47.0)
Hemoglobin: 14.3 g/dL (ref 12.0–16.0)
MCH: 31.3 pg (ref 26.0–34.0)
MCHC: 34.7 g/dL (ref 32.0–36.0)
MCV: 90.2 fL (ref 80.0–100.0)
Platelets: 279 10*3/uL (ref 150–440)
RBC: 4.55 MIL/uL (ref 3.80–5.20)
RDW: 13.6 % (ref 11.5–14.5)
WBC: 12 10*3/uL — ABNORMAL HIGH (ref 3.6–11.0)

## 2018-07-22 LAB — URINALYSIS, COMPLETE (UACMP) WITH MICROSCOPIC
Bilirubin Urine: NEGATIVE
Ketones, ur: NEGATIVE mg/dL
NITRITE: NEGATIVE
PH: 6 (ref 5.0–8.0)
Protein, ur: 30 mg/dL — AB
SPECIFIC GRAVITY, URINE: 1.017 (ref 1.005–1.030)
Squamous Epithelial / LPF: NONE SEEN (ref 0–5)
WBC, UA: >50 WBC/hpf — ABNORMAL HIGH (ref 0–5)

## 2018-07-22 LAB — BASIC METABOLIC PANEL
ANION GAP: 14 (ref 5–15)
BUN: 11 mg/dL (ref 8–23)
CALCIUM: 8.8 mg/dL — AB (ref 8.9–10.3)
CO2: 22 mmol/L (ref 22–32)
Chloride: 98 mmol/L (ref 98–111)
Creatinine, Ser: 1.03 mg/dL — ABNORMAL HIGH (ref 0.44–1.00)
GFR calc Af Amer: 55 mL/min — ABNORMAL LOW (ref >60)
GFR calc non Af Amer: 47 mL/min — ABNORMAL LOW (ref >60)
GLUCOSE: 497 mg/dL — AB (ref 70–99)
Potassium: 3.9 mmol/L (ref 3.5–5.1)
Sodium: 134 mmol/L — ABNORMAL LOW (ref 135–145)

## 2018-07-22 MED ORDER — INSULIN ASPART 100 UNIT/ML ~~LOC~~ SOLN: 6.0000 [IU] | Freq: Once | SUBCUTANEOUS | Status: DC

## 2018-07-22 MED ORDER — LEVOFLOXACIN IN D5W 500 MG/100ML IV SOLN
500.0000 mg | Freq: Once | INTRAVENOUS | Status: AC
  Administered 2018-07-22: 500 mg via INTRAVENOUS
  Filled 2018-07-22: qty 100

## 2018-07-22 MED ORDER — CIPROFLOXACIN HCL 500 MG PO TABS: 500.0000 mg | ORAL_TABLET | Freq: Two times a day (BID) | ORAL | 0 refills | Status: DC

## 2018-07-22 NOTE — ED Provider Notes (Signed)
[facility] Emergency Department Provider Note  Time seen: 10:39 PM  I have reviewed the triage vital signs and the nursing notes.   HISTORY  Chief Complaint Flank Pain    HPI Alexandra English is a 84 y.o. female with a past medical history of diabetes, hypertension, presents to the emergency department with left flank pain.  According to the patient for the past 10 days she has been expensing pain in her left lower abdomen and left back.  Also states she has been experiencing mild dysuria as well.  Patient states baseline incontinence.  Denies any fever.  States the pain was somewhat worse tonight while she was attempting to play cards with her friends so she came to the emergency department for evaluation.  Denies any nausea, vomiting, diarrhea, black or bloody stool.  No chest pain or trouble breathing.  Largely negative review of systems otherwise.    Past Medical History:  Diagnosis Date  . Diabetes mellitus 04/2009   HgbA1c 7.1% 07/2009, Monofilament normal, Ortho pending  . Fracture    of rib  . Glaucoma    Dr. Thomasene Ripple  . Hypertension   . Sleep apnea    Per Pt. was supposed to start CPAP, however reluctant to try  . Stroke [facility])    Left frontal4/2010, Left parietal and right parietal 04/2011 , Started on Plavix and then changed to Coumadin by recommendation of Dr. Carlis Abbott  . Thyroid disease    hypothyroidism    Patient Active Problem List   Diagnosis Date Noted  . UTI (urinary tract infection) 10/15/2017  . Pressure injury of skin 10/14/2017  . Acute encephalopathy 10/13/2017  . Fall 08/16/2017  . Acute cystitis 08/14/2017  . GERD (gastroesophageal reflux disease) 07/10/2017  . Encounter for medication review and counseling 07/02/2017  . Dizziness 06/05/2017  . CVA (cerebral vascular accident) ([place]) 06/05/2017  . Nonintractable headache 02/03/2017  . Syncope 10/19/2016  . Dysuria 01/17/2016  . Candidal dermatitis 01/17/2016  . Back pain  06/27/2015  . Diabetic polyneuropathy associated with type 2 diabetes mellitus ([place]) 08/19/2014  . Edema 06/04/2013  . Pre-ulcerative corn or callous 01/13/2013  . Anxiety and depression 05/12/2012  . Arthralgia 01/09/2012  . Gait instability 12/02/2011  . Hypertension 09/11/2011  . Hyperlipidemia 09/11/2011  . History of stroke 09/11/2011  . DM (diabetes mellitus), type 2 with complications ([place]) 64/33/2951  . Hypothyroidism 09/11/2011    Past Surgical History:  Procedure Laterality Date  . orthopedic surgeon     Dr. Latanya Maudlin  . TOTAL ABDOMINAL HYSTERECTOMY    . TOTAL SHOULDER REPLACEMENT Right   . WRIST SURGERY Left     Prior to Admission medications   Medication Sig Start Date End Date Taking? Authorizing Provider  amLODipine (NORVASC) 5 MG tablet Take 1 tablet (5 mg total) by mouth daily. 07/21/18   Burnard Hawthorne, FNP  aspirin 81 MG tablet Take 81 mg by mouth daily.      [provider]  atorvastatin (LIPITOR) 40 MG tablet TAKE ONE TABLET BY MOUTH ONCE DAILY 11/18/17   Burnard Hawthorne, FNP  Blood Glucose Monitoring Suppl (ONE TOUCH ULTRA SYSTEM KIT) W/DEVICE KIT 1 kit by Does not apply route once. 08/19/14   Jackolyn Confer, MD  cloNIDine (CATAPRES) 0.2 MG tablet TAKE ONE TABLET BY MOUTH AT BEDTIME 03/31/18   Burnard Hawthorne, FNP  glipiZIDE (GLUCOTROL) 5 MG tablet TAKE 1 TABLET BY MOUTH TWICE DAILY BEFORE A MEAL 08/08/17   [name]  G, FNP  glucose blood (ONE TOUCH ULTRA TEST) test strip USE ONE STRIP TO CHECK GLUCOSE TWICE DAILY E11.8 09/05/16   Coral Spikes, DO  JANUVIA 25 MG tablet TAKE 1 TABLET BY MOUTH ONCE DAILY 03/19/18   Burnard Hawthorne, FNP  Lancets [facility] ULTRASOFT) lancets Use as instructed 09/03/16   Coral Spikes, DO  levothyroxine (SYNTHROID, LEVOTHROID) 75 MCG tablet Take 1 tablet (75 mcg total) by mouth daily. 06/19/18   Burnard Hawthorne, FNP  levothyroxine (SYNTHROID, LEVOTHROID) 75 MCG tablet Take 1 tablet (75 mcg total) by mouth  daily. 07/21/18   Burnard Hawthorne, FNP  mirtazapine (REMERON) 30 MG tablet TAKE 1 TABLET BY MOUTH AT BEDTIME 07/21/18   Burnard Hawthorne, FNP  omeprazole (PRILOSEC) 20 MG capsule TAKE ONE CAPSULE BY MOUTH ONCE DAILY 02/02/18   Burnard Hawthorne, FNP    Allergies  Allergen Reactions  . Beta Adrenergic Blockers   . Cephalosporins     Taken omnicef without a problem. MGA 07/2017 Taken Ceftriaxone without a problem [facility] 10/30  . Lisinopril   . Macrobid [Nitrofurantoin Macrocrystal]   . Toprol Xl [Metoprolol Succinate]   . Penicillins Swelling and Rash    Has patient had a PCN reaction causing immediate rash, facial/tongue/throat swelling, SOB or lightheadedness with hypotension: Yes Has patient had a PCN reaction causing severe rash involving mucus membranes or skin necrosis: Unknown Has patient had a PCN reaction that required hospitalization: No Has patient had a PCN reaction occurring within the last 10 years: No If all of the above answers are "NO", then may proceed with Cephalosporin use.    Family History  Problem Relation Age of Onset  . CAD Mother   . CAD Father   . Diabetes Mellitus I Brother   . Diabetes Mellitus II Maternal Grandmother     Social History Social History   Tobacco Use  . Smoking status: Never Smoker  . Smokeless tobacco: Never Used  Substance Use Topics  . Alcohol use: No  . Drug use: No    Review of Systems Constitutional: Negative for fever. ENT: Negative for recent illness/congestion Cardiovascular: Negative for chest pain. Respiratory: Negative for shortness of breath. Gastrointestinal: Moderate left flank pain x10 days.  Negative for nausea vomiting or diarrhea Genitourinary: Mild dysuria, baseline incontinence. Musculoskeletal: Negative for musculoskeletal complaints Skin: Negative for skin complaints  Neurological: Negative for headache All other ROS negative  ____________________________________________   PHYSICAL EXAM:  VITAL  SIGNS: ED Triage Vitals  Enc Vitals Group     BP 07/22/18 2126 (!) 175/69     Pulse Rate 07/22/18 2126 (!) 105     Resp 07/22/18 2126 20     Temp 07/22/18 2126 99.1 F (37.3 C)     Temp Source 07/22/18 2126 Oral     SpO2 07/22/18 2126 97 %     Weight 07/22/18 2121 190 lb (86.2 kg)     Height 07/22/18 2121 5' 4"  (1.626 m)     Head Circumference --      Peak Flow --      Pain Score 07/22/18 2130 10     Pain Loc --      Pain Edu? --      Excl. in [place]? --    Constitutional: Alert and oriented. Well appearing and in no distress. Eyes: Normal exam ENT   Head: Normocephalic and atraumatic.   Mouth/Throat: Mucous membranes are moist. Cardiovascular: Normal rate, regular rhythm.  Respiratory: Normal respiratory effort without  tachypnea nor retractions. Breath sounds are clear Gastrointestinal: Soft, mild left-sided abdominal tenderness to palpation with somewhat moderate tenderness in left lower quadrant.  No distention, no rebound or guarding. Musculoskeletal: Nontender with normal range of motion in all extremities.  Neurologic:  Normal speech and language. No gross focal neurologic deficits  Skin:  Skin is warm, dry and intact.  Psychiatric: Mood and affect are normal.   ____________________________________________   RADIOLOGY  CT scan largely negative.  ____________________________________________   INITIAL IMPRESSION / ASSESSMENT AND PLAN / ED COURSE  Pertinent labs & imaging results that were available during my care of the patient were reviewed by me and considered in my medical decision making (see chart for details).  Patient presents to the emergency department for 10 days of left flank pain and also complaining of dysuria.  Differential would include UTI, ureterolithiasis, Pyelonephritis, diverticulitis, colitis, muscular skeletal pain.  We will check labs, urinalysis, obtain a CT renal scan to further evaluate.  Patient agreeable to plan of care.  CT scan  largely negative however urine positive for urinary tract infection.  Patient has cephalosporin allergy we will dose Levaquin and discharge with ciprofloxacin.  Urine culture has been added on as well.  Patient states she is feeling much better at this time.  ____________________________________________   FINAL CLINICAL IMPRESSION(S) / ED DIAGNOSES  Flank pain UTI   Harvest Dark, MD 07/22/18 2351

## 2018-07-22 NOTE — ED Notes (Addendum)
Pt to Ct by stretcher

## 2018-07-22 NOTE — Discharge Instructions (Signed)
Take antibiotics as prescribed for the next 10 days twice daily.  Return to the emergency department for any worsening pain or development of fever.

## 2018-07-22 NOTE — ED Triage Notes (Signed)
Pt has a small area of skin breakdown  On both inner buttocks   Pt reports pain l  Flank  l pain as well   Pt denies  Any injury  Denies any   Nausea or vomiting

## 2018-07-23 ENCOUNTER — Observation Stay
Admission: EM | Admit: 2018-07-23 | Discharge: 2018-07-23 | Disposition: A | Discharge status: Home / Self Care | Payer: Medicare Other | Attending: Internal Medicine | Admitting: Internal Medicine

## 2018-07-23 ENCOUNTER — Other Ambulatory Visit: Payer: Self-pay

## 2018-07-23 DIAGNOSIS — F329 Major depressive disorder, single episode, unspecified: Secondary | ICD-10-CM | POA: Insufficient documentation

## 2018-07-23 DIAGNOSIS — E039 Hypothyroidism, unspecified: Secondary | ICD-10-CM | POA: Insufficient documentation

## 2018-07-23 DIAGNOSIS — Z96611 Presence of right artificial shoulder joint: Secondary | ICD-10-CM | POA: Insufficient documentation

## 2018-07-23 DIAGNOSIS — Z79899 Other long term (current) drug therapy: Secondary | ICD-10-CM | POA: Insufficient documentation

## 2018-07-23 DIAGNOSIS — Z66 Do not resuscitate: Secondary | ICD-10-CM | POA: Insufficient documentation

## 2018-07-23 DIAGNOSIS — F419 Anxiety disorder, unspecified: Secondary | ICD-10-CM | POA: Insufficient documentation

## 2018-07-23 DIAGNOSIS — E119 Type 2 diabetes mellitus without complications: Secondary | ICD-10-CM | POA: Insufficient documentation

## 2018-07-23 DIAGNOSIS — G4730 Sleep apnea, unspecified: Secondary | ICD-10-CM | POA: Insufficient documentation

## 2018-07-23 DIAGNOSIS — Z7984 Long term (current) use of oral hypoglycemic drugs: Secondary | ICD-10-CM | POA: Insufficient documentation

## 2018-07-23 DIAGNOSIS — R109 Unspecified abdominal pain: Secondary | ICD-10-CM | POA: Insufficient documentation

## 2018-07-23 DIAGNOSIS — I10 Essential (primary) hypertension: Secondary | ICD-10-CM | POA: Insufficient documentation

## 2018-07-23 DIAGNOSIS — R4182 Altered mental status, unspecified: Principal | ICD-10-CM | POA: Insufficient documentation

## 2018-07-23 DIAGNOSIS — Z8673 Personal history of transient ischemic attack (TIA), and cerebral infarction without residual deficits: Secondary | ICD-10-CM | POA: Insufficient documentation

## 2018-07-23 DIAGNOSIS — E785 Hyperlipidemia, unspecified: Secondary | ICD-10-CM | POA: Insufficient documentation

## 2018-07-23 DIAGNOSIS — N390 Urinary tract infection, site not specified: Secondary | ICD-10-CM | POA: Insufficient documentation

## 2018-07-23 DIAGNOSIS — I2510 Atherosclerotic heart disease of native coronary artery without angina pectoris: Secondary | ICD-10-CM | POA: Insufficient documentation

## 2018-07-23 DIAGNOSIS — Z7982 Long term (current) use of aspirin: Secondary | ICD-10-CM | POA: Insufficient documentation

## 2018-07-23 DIAGNOSIS — N3000 Acute cystitis without hematuria: Secondary | ICD-10-CM

## 2018-07-23 DIAGNOSIS — R410 Disorientation, unspecified: Secondary | ICD-10-CM | POA: Diagnosis present

## 2018-07-23 LAB — GLUCOSE, CAPILLARY
Glucose-Capillary: 350 mg/dL — ABNORMAL HIGH (ref 70–99)
Glucose-Capillary: 308 mg/dL — ABNORMAL HIGH (ref 70–99)
Glucose-Capillary: 257 mg/dL — ABNORMAL HIGH (ref 70–99)
Glucose-Capillary: 267 mg/dL — ABNORMAL HIGH (ref 70–99)

## 2018-07-23 LAB — HEMOGLOBIN A1C
Hgb A1c MFr Bld: 11.1 % — ABNORMAL HIGH (ref 4.8–5.6)
Mean Plasma Glucose: 271.87 mg/dL

## 2018-07-23 LAB — TSH: TSH: 1.747 u[IU]/mL (ref 0.350–4.500)

## 2018-07-23 MED ORDER — LINAGLIPTIN 5 MG PO TABS
5.0000 mg | ORAL_TABLET | Freq: Every day | ORAL | Status: DC
  Filled 2018-07-23: qty 1

## 2018-07-23 MED ORDER — ACETAMINOPHEN 650 MG RE SUPP: 650.0000 mg | Freq: Four times a day (QID) | RECTAL | Status: DC | PRN

## 2018-07-23 MED ORDER — AMLODIPINE BESYLATE 5 MG PO TABS
5.0000 mg | ORAL_TABLET | Freq: Every day | ORAL | Status: DC
  Administered 2018-07-23: 5 mg via ORAL
  Filled 2018-07-23: qty 1

## 2018-07-23 MED ORDER — MIRTAZAPINE 15 MG PO TABS: 30.0000 mg | ORAL_TABLET | Freq: Every day | ORAL | Status: DC

## 2018-07-23 MED ORDER — ONDANSETRON HCL 4 MG PO TABS: 4.0000 mg | ORAL_TABLET | Freq: Four times a day (QID) | ORAL | Status: DC | PRN

## 2018-07-23 MED ORDER — ENOXAPARIN SODIUM 40 MG/0.4ML ~~LOC~~ SOLN: 40.0000 mg | SUBCUTANEOUS | Status: DC

## 2018-07-23 MED ORDER — DOCUSATE SODIUM 100 MG PO CAPS
100.0000 mg | ORAL_CAPSULE | Freq: Two times a day (BID) | ORAL | Status: DC
  Administered 2018-07-23: 13:00:00 100 mg via ORAL
  Filled 2018-07-23: qty 1

## 2018-07-23 MED ORDER — PANTOPRAZOLE SODIUM 40 MG PO TBEC
40.0000 mg | DELAYED_RELEASE_TABLET | Freq: Every day | ORAL | Status: DC
  Administered 2018-07-23: 12:00:00 40 mg via ORAL
  Filled 2018-07-23: qty 1

## 2018-07-23 MED ORDER — ACETAMINOPHEN 325 MG PO TABS
650.0000 mg | ORAL_TABLET | Freq: Four times a day (QID) | ORAL | Status: DC | PRN
  Administered 2018-07-23 (×2): 650 mg via ORAL
  Filled 2018-07-23 (×2): qty 2

## 2018-07-23 MED ORDER — LEVOTHYROXINE SODIUM 50 MCG PO TABS
75.0000 ug | ORAL_TABLET | Freq: Every day | ORAL | Status: DC
  Administered 2018-07-23: 75 ug via ORAL

## 2018-07-23 MED ORDER — ATORVASTATIN CALCIUM 20 MG PO TABS
40.0000 mg | ORAL_TABLET | Freq: Every day | ORAL | Status: DC
  Administered 2018-07-23: 13:00:00 40 mg via ORAL
  Filled 2018-07-23: qty 2

## 2018-07-23 MED ORDER — SODIUM CHLORIDE 0.9 % IV SOLN
INTRAVENOUS | Status: DC
  Administered 2018-07-23: 05:00:00 via INTRAVENOUS

## 2018-07-23 MED ORDER — ONDANSETRON HCL 4 MG/2ML IJ SOLN: 4.0000 mg | Freq: Four times a day (QID) | INTRAMUSCULAR | Status: DC | PRN

## 2018-07-23 MED ORDER — AMLODIPINE BESYLATE 5 MG PO TABS: 5.0000 mg | ORAL_TABLET | Freq: Once | ORAL | Status: DC

## 2018-07-23 MED ORDER — CLONIDINE HCL 0.1 MG PO TABS: 0.2000 mg | ORAL_TABLET | Freq: Every day | ORAL | Status: DC

## 2018-07-23 MED ORDER — ASPIRIN 81 MG PO CHEW
81.0000 mg | CHEWABLE_TABLET | Freq: Every day | ORAL | Status: DC
  Administered 2018-07-23: 13:00:00 81 mg via ORAL
  Filled 2018-07-23: qty 1

## 2018-07-23 MED ORDER — CIPROFLOXACIN IN D5W 400 MG/200ML IV SOLN
400.0000 mg | Freq: Two times a day (BID) | INTRAVENOUS | Status: DC
  Filled 2018-07-23 (×2): qty 200

## 2018-07-23 MED ORDER — INSULIN ASPART 100 UNIT/ML ~~LOC~~ SOLN: 0.0000 [IU] | Freq: Every day | SUBCUTANEOUS | Status: DC

## 2018-07-23 MED ORDER — INSULIN ASPART 100 UNIT/ML ~~LOC~~ SOLN
0.0000 [IU] | Freq: Three times a day (TID) | SUBCUTANEOUS | Status: DC
  Administered 2018-07-23 (×2): 5 [IU] via SUBCUTANEOUS
  Filled 2018-07-23: qty 1

## 2018-07-23 NOTE — Care Management Note (Signed)
Case Management Note  Patient Details  Name: Alexandra English MRN: [id number] Date of Birth: Sep 08, 1927  Subjective/Objective:  Admitted to [facility] under observation status with the diagnosis of confusion. [facility]. Son is Gery PrayBarry and he lives in [place] [phone]). Last seen Arnett FNP at [facility] 10/2017. [facility] in the past. [facility] (08/2017) and [facility] in the past. Motorized wheelchair and rolling walker in the apartment. Self feed, Self baths and dressing.                   Action/Plan: Will continue to follow for discharge plans   Expected Discharge Date:                  Expected Discharge Plan:     In-House Referral:     Discharge planning Services     Post Acute Care Choice:    Choice offered to:     DME Arranged:    DME Agency:     HH Arranged:    HH Agency:     Status of Service:     If discussed at [employer] Length of Stay Meetings, dates discussed:    Additional Comments:  Gwenette GreetBrenda S Charlie Seda, RN MSN CCM Care Management [phone] 07/23/2018, 9:09 AM

## 2018-07-23 NOTE — Discharge Summary (Signed)
[place] [facility] [place] NAME: Alexandra English    MR#:  [id number]  DATE OF BIRTH:  Apr 27, 1927  DATE OF ADMISSION:  07/23/2018 ADMITTING PHYSICIAN: Harrie Foreman, MD  DATE OF DISCHARGE: 07/23/2018   PRIMARY CARE PHYSICIAN: Burnard Hawthorne, FNP    ADMISSION DIAGNOSIS:  Acute cystitis without hematuria [N30.00]  DISCHARGE DIAGNOSIS:  Active Problems:   Confusion   UTI  SECONDARY DIAGNOSIS:   Past Medical History:  Diagnosis Date  . Diabetes mellitus 04/2009   HgbA1c 7.1% 07/2009, Monofilament normal, Ortho pending  . Fracture    of rib  . Glaucoma    Dr. Thomasene Ripple  . Hypertension   . Sleep apnea    Per Pt. was supposed to start CPAP, however reluctant to try  . Stroke [facility])    Left frontal4/2010, Left parietal and right parietal 04/2011 , Started on Plavix and then changed to Coumadin by recommendation of Dr. Carlis Abbott  . Thyroid disease    hypothyroidism    HOSPITAL COURSE:   This is an 84 year old female admitted for confusion. 1.  Altered mental status: Confusion; improving with hydration.  Continue to monitor. Back to normal, fully alert and oriented. 2.  Urinary tract infection: No signs or symptoms of sepsis at this time.  IV ciprofloxacin while hospitalized. 3.  Hypertension: Uncontrolled; likely due to missed doses of medication.  Continue amlodipine and clonidine. 4.  Diabetes mellitus type 2: Hold oral hypoglycemic agents.  Sliding scale insulin while hospitalized.  May continue Tradjenta. 5.  Hyperlipidemia: Continue statin therapy 6.  DVT prophylaxis: Lovenox 7.  GI prophylaxis: Pantoprazole  Last night ER tried to d/c back to ALF, but when reached there by EMS, she was some confused and her electric wheel chair was not charged, so she could nto have been mooving, so sent to ER back, fine now. DISCHARGE CONDITIONS:   Stable.  CONSULTS OBTAINED:    DRUG ALLERGIES:   Allergies  Allergen Reactions  . Beta  Adrenergic Blockers   . Cephalosporins     Taken omnicef without a problem. MGA 07/2017 Taken Ceftriaxone without a problem [facility] 10/30  . Lisinopril   . Macrobid [Nitrofurantoin Macrocrystal]   . Toprol Xl [Metoprolol Succinate]   . Penicillins Swelling and Rash    Has patient had a PCN reaction causing immediate rash, facial/tongue/throat swelling, SOB or lightheadedness with hypotension: Yes Has patient had a PCN reaction causing severe rash involving mucus membranes or skin necrosis: Unknown Has patient had a PCN reaction that required hospitalization: No Has patient had a PCN reaction occurring within the last 10 years: No If all of the above answers are "NO", then may proceed with Cephalosporin use.    DISCHARGE MEDICATIONS:   Allergies as of 07/23/2018      Reactions   Beta Adrenergic Blockers    Cephalosporins    Taken omnicef without a problem. MGA 07/2017 Taken Ceftriaxone without a problem [facility] 10/30   Lisinopril    Macrobid [nitrofurantoin Macrocrystal]    Toprol Xl [metoprolol Succinate]    Penicillins Swelling, Rash   Has patient had a PCN reaction causing immediate rash, facial/tongue/throat swelling, SOB or lightheadedness with hypotension: Yes Has patient had a PCN reaction causing severe rash involving mucus membranes or skin necrosis: Unknown Has patient had a PCN reaction that required hospitalization: No Has patient had a PCN reaction occurring within the last 10 years: No If all of the above answers are "NO", then may  proceed with Cephalosporin use.      Medication List    TAKE these medications   amLODipine 5 MG tablet Commonly known as:  NORVASC Take 1 tablet (5 mg total) by mouth daily.   aspirin 81 MG tablet Take 81 mg by mouth daily.   atorvastatin 40 MG tablet Commonly known as:  LIPITOR TAKE ONE TABLET BY MOUTH ONCE DAILY   ciprofloxacin 500 MG tablet Commonly known as:  CIPRO Take 1 tablet (500 mg total) by mouth 2 (two) times daily for 10  days.   cloNIDine 0.2 MG tablet Commonly known as:  CATAPRES TAKE ONE TABLET BY MOUTH AT BEDTIME   glipiZIDE 5 MG tablet Commonly known as:  GLUCOTROL TAKE 1 TABLET BY MOUTH TWICE DAILY BEFORE A MEAL   glucose blood test strip USE ONE STRIP TO CHECK GLUCOSE TWICE DAILY E11.8   JANUVIA 25 MG tablet Generic drug:  sitaGLIPtin TAKE 1 TABLET BY MOUTH ONCE DAILY   levothyroxine 75 MCG tablet Commonly known as:  SYNTHROID, LEVOTHROID Take 1 tablet (75 mcg total) by mouth daily.   levothyroxine 75 MCG tablet Commonly known as:  SYNTHROID, LEVOTHROID Take 1 tablet (75 mcg total) by mouth daily.   mirtazapine 30 MG tablet Commonly known as:  REMERON TAKE 1 TABLET BY MOUTH AT BEDTIME   omeprazole 20 MG capsule Commonly known as:  PRILOSEC TAKE ONE CAPSULE BY MOUTH ONCE DAILY   ONE TOUCH ULTRA SYSTEM KIT w/Device Kit 1 kit by Does not apply route once.   onetouch ultrasoft lancets Use as instructed        DISCHARGE INSTRUCTIONS:    Follow with PMD.  If you experience worsening of your admission symptoms, develop shortness of breath, life threatening emergency, suicidal or homicidal thoughts you must seek medical attention immediately by calling 911 or calling your MD immediately  if symptoms less severe.  You Must read complete instructions/literature along with all the possible adverse reactions/side effects for all the Medicines you take and that have been prescribed to you. Take any new Medicines after you have completely understood and accept all the possible adverse reactions/side effects.   Please note  You were cared for by a hospitalist during your hospital stay. If you have any questions about your discharge medications or the care you received while you were in the hospital after you are discharged, you can call the unit and asked to speak with the hospitalist on call if the hospitalist that took care of you is not available. Once you are discharged, your primary  care physician will handle any further medical issues. Please note that NO REFILLS for any discharge medications will be authorized once you are discharged, as it is imperative that you return to your primary care physician (or establish a relationship with a primary care physician if you do not have one) for your aftercare needs so that they can reassess your need for medications and monitor your lab values.    Today   CHIEF COMPLAINT:   Chief Complaint  Patient presents with  . Weakness    HISTORY OF PRESENT ILLNESS:  Alexandra English  is a 84 y.o. female with a known history of hypertension, hypothyroidism, diabetes and old stroke presents to the emergency department with weakness.  The patient had been seen in the emergency department today for the same and was found to have a urinary tract infection.  She was discharged with oral ciprofloxacin.  Medical transport brought the patient back to her assisted  living facility where they found her to be mildly confused and too weak to reliably ambulate and care for herself which prompted her to return to the emergency department which prompted the emergency department staff to call the hospitalist service for admission.  VITAL SIGNS:  Blood pressure (!) 168/65, pulse (!) 44, temperature 98.6 F (37 C), temperature source Oral, resp. rate 18, height 5' 5"  (1.651 m), weight 82.9 kg, SpO2 100 %.  I/O:    Intake/Output Summary (Last 24 hours) at 07/23/2018 1512 Last data filed at 07/23/2018 1330 Gross per 24 hour  Intake 240 ml  Output -  Net 240 ml    PHYSICAL EXAMINATION:  GENERAL:  84 y.o.-year-old patient lying in the bed with no acute distress.  EYES: Pupils equal, round, reactive to light and accommodation. No scleral icterus. Extraocular muscles intact.  HEENT: Head atraumatic, normocephalic. Oropharynx and nasopharynx clear.  NECK:  Supple, no jugular venous distention. No thyroid enlargement, no tenderness.  LUNGS: Normal breath sounds  bilaterally, no wheezing, rales,rhonchi or crepitation. No use of accessory muscles of respiration.  CARDIOVASCULAR: S1, S2 normal. No murmurs, rubs, or gallops.  ABDOMEN: Soft, non-tender, non-distended. Bowel sounds present. No organomegaly or mass.  EXTREMITIES: No pedal edema, cyanosis, or clubbing.  NEUROLOGIC: Cranial nerves II through XII are intact. Muscle strength 4/5 in all extremities. Sensation intact. Gait not checked.  PSYCHIATRIC: The patient is alert and oriented x 3.  SKIN: No obvious rash, lesion, or ulcer.   DATA REVIEW:   CBC Recent Labs  Lab 07/22/18 2144  WBC 12.0*  HGB 14.3  HCT 41.1  PLT 279    Chemistries  Recent Labs  Lab 07/22/18 2144  NA 134*  K 3.9  CL 98  CO2 22  GLUCOSE 497*  BUN 11  CREATININE 1.03*  CALCIUM 8.8*    Cardiac Enzymes No results for input(s): TROPONINI in the last 168 hours.  Microbiology Results  Results for orders placed or performed in visit on 10/28/17  Urine Culture     Status: None   Collection Time: 10/28/17  9:59 AM  Result Value Ref Range Status   MICRO NUMBER: [id number]  Final   SPECIMEN QUALITY: ADEQUATE  Final   Sample Source URINE  Final   STATUS: FINAL  Final   Result: No Growth  Final    RADIOLOGY:  Ct Renal Stone Study  Result Date: 07/22/2018 CLINICAL DATA:  84 year old female with history of flank pain. Skin breakdown in both inner buttocks. EXAM: CT ABDOMEN AND PELVIS WITHOUT CONTRAST TECHNIQUE: Multidetector CT imaging of the abdomen and pelvis was performed following the standard protocol without IV contrast. COMPARISON:  CT the abdomen and pelvis 07/03/2015. FINDINGS: Lower chest: Large hiatal hernia. Aortic atherosclerosis. Calcified atherosclerotic plaque in the left anterior descending, left circumflex and right coronary arteries. Hepatobiliary: No definite cystic or solid hepatic lesions are confidently identified on today's noncontrast CT examination. Status post cholecystectomy. Pancreas: No  definite pancreatic mass or peripancreatic fluid or inflammatory changes are noted on today's noncontrast CT examination. Mild fatty atrophy throughout the pancreas. Spleen: Unremarkable. Adrenals/Urinary Tract: Right kidney and bilateral adrenal glands are normal in appearance. Exophytic 3.4 cm low-attenuation lesion extending off the posterior aspect of the upper pole of the left kidney, incompletely characterized on today's noncontrast CT examination, but similar to prior study from 2016, statistically likely a cyst. No hydroureteronephrosis. Urinary bladder is normal in appearance. Stomach/Bowel: Intra-abdominal portion of the stomach is normal. No pathologic dilatation of small bowel or  colon. Numerous colonic diverticulae are noted, particularly in the sigmoid colon, without surrounding inflammatory changes to suggest an acute diverticulitis at this time. The appendix is not confidently identified and may be surgically absent. Regardless, there are no inflammatory changes noted adjacent to the cecum to suggest the presence of an acute appendicitis at this time. Vascular/Lymphatic: Aortic atherosclerosis, without evidence of aneurysm in the abdominal vasculature. No lymphadenopathy noted in the abdomen. Reproductive: Status post hysterectomy. Ovaries are not confidently identified may be surgically absent or atrophic. Other: No significant volume of ascites.  No pneumoperitoneum. Musculoskeletal: There are no aggressive appearing lytic or blastic lesions noted in the visualized portions of the skeleton. IMPRESSION: 1. No acute findings are noted in the abdomen or pelvis to account for the patient's symptoms. Specifically, no urinary tract calculi no findings of urinary tract obstruction are noted at this time. 2. Colonic diverticulosis without evidence of acute diverticulitis at this time. 3. Aortic atherosclerosis, in addition to least 3 vessel coronary artery disease. 4. Large hiatal hernia. 5. Additional  incidental findings, as above. Electronically Signed   By: Vinnie Langton M.D.   On: 07/22/2018 23:28    EKG:   Orders placed or performed during the hospital encounter of 10/13/17  . EKG 12-Lead  . EKG 12-Lead  . ED EKG  . ED EKG      Management plans discussed with the patient, family and they are in agreement.  CODE STATUS: DNR    Code Status Orders  (From admission, onward)         Start     Ordered   07/23/18 1403  Do not attempt resuscitation (DNR)  Continuous    Question Answer Comment  In the event of cardiac or respiratory ARREST Do not call a "code blue"   In the event of cardiac or respiratory ARREST Do not perform Intubation, CPR, defibrillation or ACLS   In the event of cardiac or respiratory ARREST Use medication by any route, position, wound care, and other measures to relive pain and suffering. May use oxygen, suction and manual treatment of airway obstruction as needed for comfort.      07/23/18 1402        Code Status History    Date Active Date Inactive Code Status Order ID Comments User Context   07/23/2018 0618 07/23/2018 1402 Full Code [id number]  Harrie Foreman, MD Inpatient   10/13/2017 1550 10/15/2017 1757 Full Code [id number]  Loletha Grayer, MD ED   08/14/2017 1953 08/18/2017 2018 Full Code [id number]  Nicholes Mango, MD Inpatient   10/19/2016 0702 10/20/2016 1723 Full Code [id number]  Harrie Foreman, MD Inpatient    Advance Directive Documentation     Most Recent Value  Type of Advance Directive  Healthcare Power of Attorney  Pre-existing out of facility DNR order (yellow form or pink MOST form)  -  "MOST" Form in Place?  -       TOTAL TIME TAKING CARE OF THIS PATIENT: 35 minutes.    Vaughan Basta M.D on 07/23/2018 at 3:12 PM  Between 7am to 6pm - Pager - [phone]  After 6pm go to [email or web address] - password [email or web address] [facility]  Office  [phone]  CC: Primary care physician; Burnard Hawthorne,  FNP   Note: This dictation was prepared with Dragon dictation along with smaller phrase technology. Any transcriptional errors that result from this process are unintentional.

## 2018-07-23 NOTE — ED Notes (Signed)
Attempted report; was told to call back in 10 mins. 

## 2018-07-23 NOTE — ED Triage Notes (Signed)
[facility] d/t weakness and her power chair being dead. Pt is unable is get around without her power chair, becoming a safety issue for EMS to leave her. Pt is A&O x4 at this time.

## 2018-07-23 NOTE — Progress Notes (Signed)
Chaplain responded to an OR for an AD. Pt said she is going home and dont need it. She didn't request it.    07/23/18 1500  Clinical Encounter Type  Visited With Patient  Visit Type Initial  Referral From Nurse  Spiritual Encounters  Spiritual Needs Brochure

## 2018-07-23 NOTE — Progress Notes (Signed)
Inpatient Diabetes Program Recommendations  AACE/ADA: New Consensus Statement on Inpatient Glycemic Control (2019)  Target Ranges:  Prepandial:   less than 140 mg/dL      Peak postprandial:   less than 180 mg/dL (1-2 hours)      Critically ill patients:  140 - 180 mg/dL   Results for [name], Alexandra L (MRN [id number]) as of 07/23/2018 12:51  Ref. Range 07/23/2018 01:43 07/23/2018 04:45 07/23/2018 07:39 07/23/2018 11:31  Glucose-Capillary Latest Ref Range: 70 - 99 mg/dL 409350 (H) 811308 (H) 914257 (H) 267 (H)  Results for [name], Alexandra L (MRN [id number]) as of 07/23/2018 12:51  Ref. Range 07/22/2018 21:44  Glucose Latest Ref Range: 70 - 99 mg/dL 086497 (H)  Hemoglobin V7QA1C Latest Ref Range: 4.8 - 5.6 % 11.1 (H)   Review of Glycemic Control  Diabetes history: DM2 Outpatient Diabetes medications: Januvia 25 mg daily, Glipizide 5 mg daily Current orders for Inpatient glycemic control: Tradjenta 5 mg daily, Novolog 0-9 units TID with meals, Novolog 0-5 units QHS  Inpatient Diabetes Program Recommendations:  Oral DM medications: Please consider ordering Glipizide 5 mg daily. HgbA1C: A1C 11.1% on 07/22/18 indicating an average glucose of 272 mg/dl over the past 2-3 months. Patient needs to follow up with PCP regarding DM control. Outpatient DM medications: Recommend MD re-evaluate and make adjustments to outpatient DM medications and consider adding additional DM medications if appropriate. Please note that patient is NOT willing to take insulin as an outpatient.  NOTE: Spoke with patient about diabetes and home regimen for diabetes control. Patient reports that she is followed by PCP for diabetes management and currently she takes Januvia 25 mg daily, Glipizide 5 mg daily as an outpatient for diabetes control. Patient lives at assisted living facility and she reports that she manages her own medications.  Per chart, last office visit with PCP was on 10/21/17. Patient reports it has been a while since she last seen PCP. Per chart,  patient should be taking Glipizide 5 mg BID but patient reports that she only takes it once a day in the morning. Patient states that she checks her glucose 1 time per day and that it has been running higher lately, usually in the 200's mg/dl. Discussed A1C results (11.1% on 07/22/18) and explained that her current A1C indicates an average glucose of 272 mg/dl over the past 2-3 months. Discussed glucose and A1C goals. Discussed importance of checking CBGs and maintaining good CBG control to prevent long-term and short-term complications. Explained how hyperglycemia leads to damage within blood vessels which lead to the common complications seen with uncontrolled diabetes. Stressed to the patient the importance of improving glycemic control to prevent further complications from uncontrolled diabetes. Discussed impact of nutrition, exercise, stress, sickness, and medications on diabetes control. Patient states that she is able to get oral DM medications but notes that the Januvia copay is expensive. Inquired about willingness to use insulin as an outpatient and patient reports that she will not use insulin as an outpatient. Explained that she likely needs additional DM medications to improve DM control. Patient states she would be agreeable to making adjustment with outpatient DM medications or adding an additional oral DM medication if MD prescribes at time of discharge. Encouraged patient to take medications as prescribed an to follow up with PCP.  Patient verbalized understanding of information discussed and she states that she has no further questions at this time related to diabetes.  Thanks, Orlando PennerMarie Maan Zarcone, RN, MSN, CDE Diabetes Coordinator Inpatient Diabetes Program  [phone] (Team Pager)

## 2018-07-23 NOTE — Progress Notes (Signed)
Family Meeting Note  Advance Directive: no, but pt discussed with me, and she want to assign her friend to make medical decision.  Today a meeting took place with the Patient.  The following clinical team members were present during this meeting:MD  The following were discussed:Patient's diagnosis: Weakness, DM, A fib, Htn, Stroke Patient's progosis: Unable to determine and Goals for treatment: DNR  Additional follow-up to be provided: PMD  Time spent during discussion:20 minutes  Altamese DillingVaibhavkumar Farrah Skoda, MD

## 2018-07-23 NOTE — Progress Notes (Signed)
Pharmacy Antibiotic Note  Alexandra English is a 84 y.o. female admitted on 07/23/2018 with UTI.  Pharmacy has been consulted for ciprofloxacin dosing.  Plan: Ciprofloxacin 400 mg BID ordered.  Height: 5\' 4"  (162.6 cm) Weight: 180 lb (81.6 kg) IBW/kg (Calculated) : 54.7  Temp (24hrs), Avg:99 F (37.2 C), Min:98.8 F (37.1 C), Max:99.1 F (37.3 C)  Recent Labs  Lab 07/22/18 2144  WBC 12.0*  CREATININE 1.03*    Estimated Creatinine Clearance: 39 mL/min (A) (by C-G formula based on SCr of 1.03 mg/dL (H)).    Allergies  Allergen Reactions  . Beta Adrenergic Blockers   . Cephalosporins     Taken omnicef without a problem. MGA 07/2017 [facility]  . Lisinopril   . Macrobid [Nitrofurantoin Macrocrystal]   . Toprol Xl [Metoprolol Succinate]   . Penicillins Swelling and Rash    Has patient had a PCN reaction causing immediate rash, facial/tongue/throat swelling, SOB or lightheadedness with hypotension: Yes Has patient had a PCN reaction causing severe rash involving mucus membranes or skin necrosis: Unknown Has patient had a PCN reaction that required hospitalization: No Has patient had a PCN reaction occurring within the last 10 years: No If all of the above answers are "NO", then may proceed with Cephalosporin use.    Antimicrobials this admission: Levofloxacin x1 8/7; Ciprofloxacin 8.8  >>    >>   Dose adjustments this admission:   Microbiology results: 8/7 UCx: pending       8/7 UA: LE(+) NO2(-)  WBC >50 Thank you for allowing pharmacy to be a part of this patient's care.  Artesia Berkey S 07/23/2018 5:05 AM

## 2018-07-23 NOTE — H&P (Signed)
Alexandra English is an 84 y.o. female.   Chief Complaint: Weakness HPI: The patient with past medical history of hypertension, hypothyroidism, diabetes and old stroke presents to the emergency department with weakness.  The patient had been seen in the emergency department today for the same and was found to have a urinary tract infection.  She was discharged with oral ciprofloxacin.  Medical transport brought the patient back to her assisted living facility where they found her to be mildly confused and too weak to reliably ambulate and care for herself which prompted her to return to the emergency department which prompted the emergency department staff to call the hospitalist service for admission.  Past Medical History:  Diagnosis Date  . Diabetes mellitus 04/2009   HgbA1c 7.1% 07/2009, Monofilament normal, Ortho pending  . Fracture    of rib  . Glaucoma    Dr. Dorcas Mcmurray  . Hypertension   . Sleep apnea    Per Pt. was supposed to start CPAP, however reluctant to try  . Stroke [facility])    Left frontal4/2010, Left parietal and right parietal 04/2011 , Started on Plavix and then changed to Coumadin by recommendation of Dr. Chestine Spore  . Thyroid disease    hypothyroidism    Past Surgical History:  Procedure Laterality Date  . orthopedic surgeon     Dr. Yisroel Ramming  . TOTAL ABDOMINAL HYSTERECTOMY    . TOTAL SHOULDER REPLACEMENT Right   . WRIST SURGERY Left     Family History  Problem Relation Age of Onset  . CAD Mother   . CAD Father   . Diabetes Mellitus I Brother   . Diabetes Mellitus II Maternal Grandmother    Social History:  reports that she has never smoked. She has never used smokeless tobacco. She reports that she does not drink alcohol or use drugs.  Allergies:  Allergies  Allergen Reactions  . Beta Adrenergic Blockers   . Cephalosporins     Taken omnicef without a problem. MGA 07/2017 Taken Ceftriaxone without a problem [facility] 10/30  . Lisinopril   . Macrobid [Nitrofurantoin  Macrocrystal]   . Toprol Xl [Metoprolol Succinate]   . Penicillins Swelling and Rash    Has patient had a PCN reaction causing immediate rash, facial/tongue/throat swelling, SOB or lightheadedness with hypotension: Yes Has patient had a PCN reaction causing severe rash involving mucus membranes or skin necrosis: Unknown Has patient had a PCN reaction that required hospitalization: No Has patient had a PCN reaction occurring within the last 10 years: No If all of the above answers are "NO", then may proceed with Cephalosporin use.     (Not in a hospital admission)  Results for orders placed or performed during the hospital encounter of 07/23/18 (from the past 48 hour(s))  Glucose, capillary     Status: Abnormal   Collection Time: 07/23/18  4:45 AM  Result Value Ref Range   Glucose-Capillary 308 (H) 70 - 99 mg/dL   Ct Renal Stone Study  Result Date: 07/22/2018 CLINICAL DATA:  84 year old female with history of flank pain. Skin breakdown in both inner buttocks. EXAM: CT ABDOMEN AND PELVIS WITHOUT CONTRAST TECHNIQUE: Multidetector CT imaging of the abdomen and pelvis was performed following the standard protocol without IV contrast. COMPARISON:  CT the abdomen and pelvis 07/03/2015. FINDINGS: Lower chest: Large hiatal hernia. Aortic atherosclerosis. Calcified atherosclerotic plaque in the left anterior descending, left circumflex and right coronary arteries. Hepatobiliary: No definite cystic or solid hepatic lesions are confidently identified on today's noncontrast  CT examination. Status post cholecystectomy. Pancreas: No definite pancreatic mass or peripancreatic fluid or inflammatory changes are noted on today's noncontrast CT examination. Mild fatty atrophy throughout the pancreas. Spleen: Unremarkable. Adrenals/Urinary Tract: Right kidney and bilateral adrenal glands are normal in appearance. Exophytic 3.4 cm low-attenuation lesion extending off the posterior aspect of the upper pole of the left  kidney, incompletely characterized on today's noncontrast CT examination, but similar to prior study from 2016, statistically likely a cyst. No hydroureteronephrosis. Urinary bladder is normal in appearance. Stomach/Bowel: Intra-abdominal portion of the stomach is normal. No pathologic dilatation of small bowel or colon. Numerous colonic diverticulae are noted, particularly in the sigmoid colon, without surrounding inflammatory changes to suggest an acute diverticulitis at this time. The appendix is not confidently identified and may be surgically absent. Regardless, there are no inflammatory changes noted adjacent to the cecum to suggest the presence of an acute appendicitis at this time. Vascular/Lymphatic: Aortic atherosclerosis, without evidence of aneurysm in the abdominal vasculature. No lymphadenopathy noted in the abdomen. Reproductive: Status post hysterectomy. Ovaries are not confidently identified may be surgically absent or atrophic. Other: No significant volume of ascites.  No pneumoperitoneum. Musculoskeletal: There are no aggressive appearing lytic or blastic lesions noted in the visualized portions of the skeleton. IMPRESSION: 1. No acute findings are noted in the abdomen or pelvis to account for the patient's symptoms. Specifically, no urinary tract calculi no findings of urinary tract obstruction are noted at this time. 2. Colonic diverticulosis without evidence of acute diverticulitis at this time. 3. Aortic atherosclerosis, in addition to least 3 vessel coronary artery disease. 4. Large hiatal hernia. 5. Additional incidental findings, as above. Electronically Signed   By: Trudie Reed M.D.   On: 07/22/2018 23:28    Review of Systems  Constitutional: Negative for chills and fever.  HENT: Negative for sore throat and tinnitus.   Eyes: Negative for blurred vision and redness.  Respiratory: Negative for cough and shortness of breath.   Cardiovascular: Negative for chest pain,  palpitations, orthopnea and PND.  Gastrointestinal: Positive for constipation. Negative for abdominal pain, diarrhea, nausea and vomiting.  Genitourinary: Negative for dysuria, frequency and urgency.  Musculoskeletal: Negative for joint pain and myalgias.  Skin: Negative for rash.       No lesions  Neurological: Positive for weakness. Negative for speech change and focal weakness.  Endo/Heme/Allergies: Does not bruise/bleed easily.       No temperature intolerance  Psychiatric/Behavioral: Negative for depression and suicidal ideas.    Blood pressure (!) 158/66, pulse 79, temperature 98.8 F (37.1 C), temperature source Oral, resp. rate 18, height 5\' 4"  (1.626 m), weight 81.6 kg, SpO2 97 %. Physical Exam  Vitals reviewed. Constitutional: She is oriented to person, place, and time. She appears well-developed and well-nourished. No distress.  HENT:  Head: Normocephalic and atraumatic.  Mouth/Throat: Oropharynx is clear and moist.  Eyes: Pupils are equal, round, and reactive to light. Conjunctivae and EOM are normal. No scleral icterus.  Neck: Normal range of motion. Neck supple. No JVD present. No tracheal deviation present. No thyromegaly present.  Cardiovascular: Normal rate, regular rhythm and normal heart sounds. Exam reveals no gallop and no friction rub.  No murmur heard. Respiratory: Effort normal and breath sounds normal.  GI: Soft. Bowel sounds are normal. She exhibits no distension. There is no tenderness.  Genitourinary:  Genitourinary Comments: Deferred  Musculoskeletal: Normal range of motion. She exhibits no edema.  Lymphadenopathy:    She has no cervical adenopathy.  Neurological: She is alert and oriented to person, place, and time. No cranial nerve deficit. She exhibits normal muscle tone.  Skin: Skin is warm and dry. No rash noted. No erythema.  Psychiatric: She has a normal mood and affect. Judgment and thought content normal.     Assessment/Plan This is an  84 year old female admitted for confusion. 1.  Altered mental status: Confusion; improving with hydration.  Continue to monitor. 2.  Urinary tract infection: No signs or symptoms of sepsis at this time.  IV ciprofloxacin while hospitalized. 3.  Hypertension: Uncontrolled; likely due to missed doses of medication.  Continue amlodipine and clonidine. 4.  Diabetes mellitus type 2: Hold oral hypoglycemic agents.  Sliding scale insulin while hospitalized.  May continue Tradjenta. 5.  Hyperlipidemia: Continue statin therapy 6.  DVT prophylaxis: Lovenox 7.  GI prophylaxis: Pantoprazole The patient is a full code.  Time spent on admission orders and patient care approximately 45 minutes  Arnaldo Nataliamond,  Brennah Quraishi S, MD 07/23/2018, 4:50 AM

## 2018-07-23 NOTE — Care Management Obs Status (Signed)
MEDICARE OBSERVATION STATUS NOTIFICATION   Patient Details  Name: Alexandra English MRN: [id number] Date of Birth: 07/02/1927   Medicare Observation Status Notification Given:  Yes  Permission to x   Gwenette GreetBrenda S Cobe Viney, RN 07/23/2018, 8:56 AM

## 2018-07-24 ENCOUNTER — Telehealth: Payer: Self-pay | Admitting: Family

## 2018-07-24 ENCOUNTER — Telehealth: Payer: Self-pay | Admitting: *Deleted

## 2018-07-24 ENCOUNTER — Ambulatory Visit: Payer: Medicare Other | Admitting: Family

## 2018-07-24 DIAGNOSIS — R319 Hematuria, unspecified: Secondary | ICD-10-CM

## 2018-07-24 DIAGNOSIS — N3000 Acute cystitis without hematuria: Secondary | ICD-10-CM

## 2018-07-24 MED ORDER — CIPROFLOXACIN HCL 250 MG PO TABS: 250.0000 mg | ORAL_TABLET | Freq: Two times a day (BID) | ORAL | 0 refills | Status: DC

## 2018-07-24 NOTE — Telephone Encounter (Signed)
Patients son advised of below and verbalized understanding. He wasn't aware that patient missed appointment today. He requested that we send him email in order to communicate with him.   Called patient and advised her of below instructions. Scheduled follow up appointment with patient , she states she can come in on Friday due to she has transporation via [facility]. Advised patient that her script was called into [facility] she states she no longer uses them called script into [facility].   Verbally called in script to [facility].

## 2018-07-24 NOTE — Telephone Encounter (Signed)
Patient returned call to office 

## 2018-07-24 NOTE — Telephone Encounter (Signed)
Call  Pt  I sent in cipro for 3 days however I sent in standard dose for UTI.  [facility]( [facility] had sent in 10 days worth ) . I do not see any notes for complicatd UTI and she was treated with IV cipro during hospital course  She needs to watch for hypoglycemia-- advise half tablet of glipizide as drug reaction between ciprofloxacin and glipizde  Please make f/u appt with us  And schedule urine 6 weeks out to ensure blood has resolved. Pended  CrtCl 51

## 2018-07-24 NOTE — Telephone Encounter (Signed)
First attempt made to contact patient for TCM.

## 2018-07-24 NOTE — Telephone Encounter (Signed)
Pt calling states she missed her appointment this morning because of just being discharged from the hospital and needing a technician to come and fix her wheelchair.  Pt states she believes she has a bladder infection and wants to know if there is anything that her physician can give her to help without her coming into office. Pt can be reached at [phone]

## 2018-07-25 LAB — URINE CULTURE: Culture: >=100000 — AB

## 2018-07-26 NOTE — Progress Notes (Signed)
ED culture reported resistant to Ciprofloxacin - what the patient had been discharged on for UTI treatment. Dr. Cyril LoosenKinner [facility].   Patient was called for pharmacy preference and counseled to stop taking Cipro, and to take this its place. She expressed understanding. Omnicef 300 mg BID x 10 days was called in to [facility] ([phone]).   Mauri ReadingSavanna M Alexxus Sobh, PharmD Pharmacy Resident  07/26/2018 1:27 PM

## 2018-07-27 NOTE — Telephone Encounter (Signed)
noted 

## 2018-07-28 NOTE — Telephone Encounter (Signed)
retruded call no answer.

## 2018-08-06 NOTE — Telephone Encounter (Signed)
error 

## 2018-09-04 ENCOUNTER — Other Ambulatory Visit: Payer: Self-pay

## 2018-09-04 ENCOUNTER — Encounter: Payer: Self-pay | Admitting: Family

## 2018-09-04 ENCOUNTER — Emergency Department
Admission: EM | Admit: 2018-09-04 | Discharge: 2018-09-04 | Disposition: A | Discharge status: Home / Self Care | Payer: Medicare Other | Attending: Emergency Medicine | Admitting: Emergency Medicine

## 2018-09-04 ENCOUNTER — Telehealth: Payer: Self-pay | Admitting: Family

## 2018-09-04 ENCOUNTER — Ambulatory Visit: Payer: Medicare Other | Admitting: Family

## 2018-09-04 VITALS — BP 166/84 | HR 88 | Temp 98.0°F | Resp 15 | Ht 61.0 in | Wt 176.5 lb

## 2018-09-04 DIAGNOSIS — E118 Type 2 diabetes mellitus with unspecified complications: Secondary | ICD-10-CM | POA: Diagnosis not present

## 2018-09-04 DIAGNOSIS — Z7982 Long term (current) use of aspirin: Secondary | ICD-10-CM | POA: Insufficient documentation

## 2018-09-04 DIAGNOSIS — F329 Major depressive disorder, single episode, unspecified: Secondary | ICD-10-CM

## 2018-09-04 DIAGNOSIS — E039 Hypothyroidism, unspecified: Secondary | ICD-10-CM | POA: Diagnosis not present

## 2018-09-04 DIAGNOSIS — Z79899 Other long term (current) drug therapy: Secondary | ICD-10-CM | POA: Diagnosis not present

## 2018-09-04 DIAGNOSIS — I10 Essential (primary) hypertension: Secondary | ICD-10-CM

## 2018-09-04 DIAGNOSIS — F419 Anxiety disorder, unspecified: Secondary | ICD-10-CM

## 2018-09-04 DIAGNOSIS — Z23 Encounter for immunization: Secondary | ICD-10-CM | POA: Diagnosis not present

## 2018-09-04 DIAGNOSIS — R413 Other amnesia: Secondary | ICD-10-CM | POA: Insufficient documentation

## 2018-09-04 DIAGNOSIS — Z7984 Long term (current) use of oral hypoglycemic drugs: Secondary | ICD-10-CM | POA: Diagnosis not present

## 2018-09-04 DIAGNOSIS — M7989 Other specified soft tissue disorders: Secondary | ICD-10-CM | POA: Diagnosis not present

## 2018-09-04 DIAGNOSIS — R5383 Other fatigue: Secondary | ICD-10-CM | POA: Diagnosis not present

## 2018-09-04 DIAGNOSIS — E1165 Type 2 diabetes mellitus with hyperglycemia: Secondary | ICD-10-CM | POA: Insufficient documentation

## 2018-09-04 DIAGNOSIS — Z794 Long term (current) use of insulin: Principal | ICD-10-CM

## 2018-09-04 DIAGNOSIS — F32A Depression, unspecified: Secondary | ICD-10-CM

## 2018-09-04 DIAGNOSIS — Z1231 Encounter for screening mammogram for malignant neoplasm of breast: Secondary | ICD-10-CM

## 2018-09-04 DIAGNOSIS — N3001 Acute cystitis with hematuria: Secondary | ICD-10-CM

## 2018-09-04 DIAGNOSIS — W19XXXD Unspecified fall, subsequent encounter: Secondary | ICD-10-CM

## 2018-09-04 DIAGNOSIS — R739 Hyperglycemia, unspecified: Secondary | ICD-10-CM | POA: Diagnosis present

## 2018-09-04 DIAGNOSIS — Z1239 Encounter for other screening for malignant neoplasm of breast: Secondary | ICD-10-CM

## 2018-09-04 LAB — CBC WITH DIFFERENTIAL/PLATELET
BASOS ABS: 0.1 10*3/uL (ref 0.0–0.1)
BASOS PCT: 0.9 % (ref 0.0–3.0)
EOS ABS: 0.4 10*3/uL (ref 0.0–0.7)
Eosinophils Relative: 2.9 % (ref 0.0–5.0)
HEMATOCRIT: 41.4 % (ref 36.0–46.0)
HEMOGLOBIN: 14.1 g/dL (ref 12.0–15.0)
Lymphocytes Relative: 20.3 % (ref 12.0–46.0)
Lymphs Abs: 2.6 10*3/uL (ref 0.7–4.0)
MCHC: 34 g/dL (ref 30.0–36.0)
MCV: 87.6 fl (ref 78.0–100.0)
MONO ABS: 1 10*3/uL (ref 0.1–1.0)
Monocytes Relative: 7.9 % (ref 3.0–12.0)
Neutro Abs: 8.8 10*3/uL — ABNORMAL HIGH (ref 1.4–7.7)
Neutrophils Relative %: 68 % (ref 43.0–77.0)
Platelets: 368 10*3/uL (ref 150.0–400.0)
RBC: 4.73 Mil/uL (ref 3.87–5.11)
RDW: 13.4 % (ref 11.5–15.5)
WBC: 12.9 10*3/uL — AB (ref 4.0–10.5)

## 2018-09-04 LAB — URINALYSIS, COMPLETE (UACMP) WITH MICROSCOPIC
BILIRUBIN URINE: NEGATIVE
Glucose, UA: >=500 mg/dL — AB
KETONES UR: NEGATIVE mg/dL
NITRITE: POSITIVE — AB
PROTEIN: 30 mg/dL — AB
SPECIFIC GRAVITY, URINE: 1.013 (ref 1.005–1.030)
WBC, UA: >50 WBC/hpf — ABNORMAL HIGH (ref 0–5)
pH: 6 (ref 5.0–8.0)

## 2018-09-04 LAB — COMPREHENSIVE METABOLIC PANEL
ALBUMIN: 3.9 g/dL (ref 3.5–5.2)
ALT: 9 U/L (ref 0–35)
AST: 12 U/L (ref 0–37)
Alkaline Phosphatase: 113 U/L (ref 39–117)
BILIRUBIN TOTAL: 0.5 mg/dL (ref 0.2–1.2)
BUN: 12 mg/dL (ref 6–23)
CHLORIDE: 98 meq/L (ref 96–112)
CO2: 25 mEq/L (ref 19–32)
CREATININE: 0.87 mg/dL (ref 0.40–1.20)
Calcium: 9.3 mg/dL (ref 8.4–10.5)
GFR: 65.19 mL/min (ref >60.00)
Glucose, Bld: 352 mg/dL — ABNORMAL HIGH (ref 70–99)
Potassium: 3.1 mEq/L — ABNORMAL LOW (ref 3.5–5.1)
SODIUM: 136 meq/L (ref 135–145)
Total Protein: 8.1 g/dL (ref 6.0–8.3)

## 2018-09-04 LAB — CBC
HEMATOCRIT: 42.6 % (ref 35.0–47.0)
Hemoglobin: 14.5 g/dL (ref 12.0–16.0)
MCH: 30.4 pg (ref 26.0–34.0)
MCHC: 33.9 g/dL (ref 32.0–36.0)
MCV: 89.5 fL (ref 80.0–100.0)
PLATELETS: 346 10*3/uL (ref 150–440)
RBC: 4.76 MIL/uL (ref 3.80–5.20)
RDW: 13.8 % (ref 11.5–14.5)
WBC: 11.3 10*3/uL — AB (ref 3.6–11.0)

## 2018-09-04 LAB — BRAIN NATRIURETIC PEPTIDE: PRO B NATRI PEPTIDE: 29 pg/mL (ref 0.0–100.0)

## 2018-09-04 LAB — BASIC METABOLIC PANEL
Anion gap: 15 (ref 5–15)
BUN: 12 mg/dL (ref 8–23)
CO2: 24 mmol/L (ref 22–32)
Calcium: 9.1 mg/dL (ref 8.9–10.3)
Chloride: 97 mmol/L — ABNORMAL LOW (ref 98–111)
Creatinine, Ser: 0.97 mg/dL (ref 0.44–1.00)
GFR calc Af Amer: 59 mL/min — ABNORMAL LOW (ref >60)
GFR calc non Af Amer: 51 mL/min — ABNORMAL LOW (ref >60)
Glucose, Bld: 539 mg/dL (ref 70–99)
Potassium: 3 mmol/L — ABNORMAL LOW (ref 3.5–5.1)
Sodium: 136 mmol/L (ref 135–145)

## 2018-09-04 LAB — TROPONIN I: Troponin I: <0.03 ng/mL (ref <0.03)

## 2018-09-04 LAB — GLUCOSE, CAPILLARY
GLUCOSE-CAPILLARY: 211 mg/dL — AB (ref 70–99)
Glucose-Capillary: 496 mg/dL — ABNORMAL HIGH (ref 70–99)

## 2018-09-04 MED ORDER — MIRTAZAPINE 45 MG PO TABS: 45.0000 mg | ORAL_TABLET | Freq: Every day | ORAL | 1 refills | Status: AC

## 2018-09-04 MED ORDER — SITAGLIPTIN PHOSPHATE 50 MG PO TABS: 50.0000 mg | ORAL_TABLET | Freq: Every day | ORAL | 1 refills | Status: DC

## 2018-09-04 MED ORDER — CEPHALEXIN 500 MG PO CAPS: 500.0000 mg | ORAL_CAPSULE | Freq: Three times a day (TID) | ORAL | 0 refills | Status: DC

## 2018-09-04 MED ORDER — SODIUM CHLORIDE 0.9 % IV BOLUS
1000.0000 mL | Freq: Once | INTRAVENOUS | Status: AC
  Administered 2018-09-04: 1000 mL via INTRAVENOUS

## 2018-09-04 MED ORDER — ACETAMINOPHEN 500 MG PO TABS
1000.0000 mg | ORAL_TABLET | Freq: Once | ORAL | Status: AC
  Administered 2018-09-04: 1000 mg via ORAL
  Filled 2018-09-04: qty 2

## 2018-09-04 MED ORDER — SODIUM CHLORIDE 0.9 % IV SOLN
1.0000 g | Freq: Once | INTRAVENOUS | Status: AC
  Administered 2018-09-04: 1 g via INTRAVENOUS
  Filled 2018-09-04: qty 10

## 2018-09-04 MED ORDER — CLONIDINE HCL 0.2 MG PO TABS
0.2000 mg | ORAL_TABLET | Freq: Every day | ORAL | 0 refills | Status: DC
Start: 2018-09-04 — End: 2018-12-07

## 2018-09-04 MED ORDER — POTASSIUM CHLORIDE CRYS ER 20 MEQ PO TBCR
40.0000 meq | EXTENDED_RELEASE_TABLET | Freq: Once | ORAL | Status: AC
  Administered 2018-09-04: 40 meq via ORAL
  Filled 2018-09-04: qty 2

## 2018-09-04 MED ORDER — CEPHALEXIN 500 MG PO CAPS: 500.0000 mg | ORAL_CAPSULE | Freq: Three times a day (TID) | ORAL | 0 refills | Status: AC

## 2018-09-04 MED ORDER — INSULIN ASPART 100 UNIT/ML ~~LOC~~ SOLN
10.0000 [IU] | Freq: Once | SUBCUTANEOUS | Status: AC
  Administered 2018-09-04: 10 [IU] via INTRAVENOUS
  Filled 2018-09-04: qty 1

## 2018-09-04 MED ORDER — METFORMIN HCL ER 500 MG PO TB24: ORAL_TABLET | ORAL | 3 refills | Status: AC

## 2018-09-04 NOTE — Assessment & Plan Note (Addendum)
Nonspecific. Patient well appearing. Not toxic in appearance. Some concerns with memory as she had trouble remember day of month. Mini Mental was 21/28.  Pending urine studies due to h/o recurrent UTIs however no overt urine symptoms.

## 2018-09-04 NOTE — Telephone Encounter (Signed)
Call pt  Ensure urine collected today  We didn't discuss sugars as I had planned as a lot going on.  Her sugars are very uncontrolled Does she check them?  Confirm she is on JANUVIA NOT TRAJDENT. She CANNOT be on both  Has she been compliant with  Januvia?  Has she been on metformin in the past?  I would like to start metformin and also increase januvia to 50mg  if she is okay with this?  Let me know

## 2018-09-04 NOTE — Assessment & Plan Note (Signed)
Will increase remeron. Close follow up.

## 2018-09-04 NOTE — Telephone Encounter (Signed)
Spoke with patient she states they haven't came out and collected urine yet.   She states that she hasn't been checking blood sugars due to glucose monitor broken. She is only taking  Januvia.   She doesn't recall taking metformin in the past she is open to starting this and increasing Januvia.  Mentioned referral to neurology and she is ok with seeing them.

## 2018-09-04 NOTE — ED Triage Notes (Signed)
Pt sent from doctor for blood sugar in 400's. CBG on ems truck was 588. VSS at this time

## 2018-09-04 NOTE — Assessment & Plan Note (Addendum)
New ( to me). She declines compression stockings. No orthoapnea, adventitious lung sounds. Pending echo ( last 2017) and BNP. Weight loss ( no weight gain). Close follow up.

## 2018-09-04 NOTE — Telephone Encounter (Signed)
EMS called back and let me know thye are at facility picking patient up.

## 2018-09-04 NOTE — Assessment & Plan Note (Signed)
Elevated today. Given refill of clonidine and also new rx for BP cuff.

## 2018-09-04 NOTE — Assessment & Plan Note (Signed)
Recurrent Pending home health falls assessment/PT.

## 2018-09-04 NOTE — Patient Instructions (Addendum)
Home Health will come TODAY and get urine.   ;Start back on clonidine for blood pressure  Monitor blood pressure,  Goal is less than 130/80; if persistently higher, please make sooner follow up appointment so we can recheck you blood pressure and manage medications   Today we discussed referrals, orders. Echocardiogram ( ultrasound of Heart)   I have placed these orders in the system for you.  Please be sure to give us a call if you have not heard from our office regarding scheduling a test or regarding referral in a timely manner.  It is very important that you let me know as soon as possible.    Appointment with pharmacist for Diabetes next Monday - this is very important.   Please make eye exam.  Follow up with me in ONE MONTH.

## 2018-09-04 NOTE — Assessment & Plan Note (Signed)
Mild dementia based on MMSE. Referral to neurology

## 2018-09-04 NOTE — Discharge Instructions (Addendum)

## 2018-09-04 NOTE — ED Provider Notes (Signed)
[facility] Emergency Department Provider Note  ____________________________________________  Time seen: Approximately 5:52 PM  I have reviewed the triage vital signs and the nursing notes.   HISTORY  Chief Complaint No chief complaint on file.   HPI Alexandra English is a 84 y.o. female with a history of type 2 diabetes on glipizide and Januvia who presents from her primary care doctor's office for evaluation of hyperglycemia. Patient reports that her glucometer has been broken for a while. Her blood glucose usually runs in the 200s at home. She endorses compliance with her medications. Today she went to see her primary care doctor for regular checkup and she was found to have blood glucose in the 400s and was sent to the emergency room for evaluation. She does complain of mild dysuria for the last few days and has had mild fatigue. She denies flank pain, abdominal pain, fever or chills, nausea or vomiting. She also denies chest pain or URI symptoms, no shortness of breath.  Past Medical History:  Diagnosis Date  . Diabetes mellitus 04/2009   HgbA1c 7.1% 07/2009, Monofilament normal, Ortho pending  . Fracture    of rib  . Glaucoma    Dr. Thomasene Ripple  . Hypertension   . Sleep apnea    Per Pt. was supposed to start CPAP, however reluctant to try  . Stroke [facility])    Left frontal4/2010, Left parietal and right parietal 04/2011 , Started on Plavix and then changed to Coumadin by recommendation of Dr. Carlis Abbott  . Thyroid disease    hypothyroidism    Patient Active Problem List   Diagnosis Date Noted  . Other fatigue 09/04/2018  . Leg swelling 09/04/2018  . Screening for breast cancer 09/04/2018  . Memory changes 09/04/2018  . Confusion 07/23/2018  . UTI (urinary tract infection) 10/15/2017  . Pressure injury of skin 10/14/2017  . Acute encephalopathy 10/13/2017  . Fall 08/16/2017  . Acute cystitis 08/14/2017  . GERD (gastroesophageal reflux disease) 07/10/2017   . Encounter for medication review and counseling 07/02/2017  . Dizziness 06/05/2017  . CVA (cerebral vascular accident) ([place]) 06/05/2017  . Nonintractable headache 02/03/2017  . Syncope 10/19/2016  . Dysuria 01/17/2016  . Candidal dermatitis 01/17/2016  . Back pain 06/27/2015  . Diabetic polyneuropathy associated with type 2 diabetes mellitus ([place]) 08/19/2014  . Edema 06/04/2013  . Pre-ulcerative corn or callous 01/13/2013  . Anxiety and depression 05/12/2012  . Arthralgia 01/09/2012  . Gait instability 12/02/2011  . Hypertension 09/11/2011  . Hyperlipidemia 09/11/2011  . History of stroke 09/11/2011  . DM (diabetes mellitus), type 2 with complications ([place]) 60/63/0160  . Hypothyroidism 09/11/2011    Past Surgical History:  Procedure Laterality Date  . orthopedic surgeon     Dr. Latanya Maudlin  . TOTAL ABDOMINAL HYSTERECTOMY    . TOTAL SHOULDER REPLACEMENT Right   . WRIST SURGERY Left     Prior to Admission medications   Medication Sig Start Date End Date Taking? Authorizing Provider  amLODipine (NORVASC) 5 MG tablet Take 1 tablet (5 mg total) by mouth daily. 07/21/18   Burnard Hawthorne, FNP  aspirin 81 MG tablet Take 81 mg by mouth daily.      [provider]  atorvastatin (LIPITOR) 40 MG tablet TAKE ONE TABLET BY MOUTH ONCE DAILY 11/18/17   Burnard Hawthorne, FNP  Blood Glucose Monitoring Suppl (ONE TOUCH ULTRA SYSTEM KIT) W/DEVICE KIT 1 kit by Does not apply route once. 08/19/14   Jackolyn Confer, MD  cephALEXin (KEFLEX) 500 MG capsule Take 1 capsule (500 mg total) by mouth 3 (three) times daily for 7 days. 09/04/18 09/11/18  Rudene Re, MD  cloNIDine (CATAPRES) 0.2 MG tablet Take 1 tablet (0.2 mg total) by mouth at bedtime. 09/04/18   Burnard Hawthorne, FNP  glipiZIDE (GLUCOTROL) 5 MG tablet TAKE 1 TABLET BY MOUTH TWICE DAILY BEFORE A MEAL 08/08/17   Burnard Hawthorne, FNP  glucose blood (ONE TOUCH ULTRA TEST) test strip USE ONE STRIP TO CHECK GLUCOSE TWICE DAILY  E11.8 09/05/16   Coral Spikes, DO  Lancets [facility] ULTRASOFT) lancets Use as instructed 09/03/16   Coral Spikes, DO  levothyroxine (SYNTHROID, LEVOTHROID) 75 MCG tablet Take 1 tablet (75 mcg total) by mouth daily. 06/19/18   Burnard Hawthorne, FNP  metFORMIN (GLUCOPHAGE XR) 500 MG 24 hr tablet Start 561m PO qpm. 09/04/18   ABurnard Hawthorne FNP  mirtazapine (REMERON) 45 MG tablet Take 1 tablet (45 mg total) by mouth at bedtime. 09/04/18   ABurnard Hawthorne FNP  omeprazole (PRILOSEC) 20 MG capsule TAKE ONE CAPSULE BY MOUTH ONCE DAILY 02/02/18   ABurnard Hawthorne FNP  sitaGLIPtin (JANUVIA) 50 MG tablet Take 1 tablet (50 mg total) by mouth daily. 09/04/18   ABurnard Hawthorne FNP    Allergies Beta adrenergic blockers; Cephalosporins; Lisinopril; Macrobid [nitrofurantoin macrocrystal]; Toprol xl [metoprolol succinate]; and Penicillins  Family History  Problem Relation Age of Onset  . CAD Mother   . CAD Father   . Diabetes Mellitus I Brother   . Diabetes Mellitus II Maternal Grandmother     Social History Social History   Tobacco Use  . Smoking status: Never Smoker  . Smokeless tobacco: Never Used  Substance Use Topics  . Alcohol use: No  . Drug use: No    Review of Systems  Constitutional: Negative for fever. + fatigue Eyes: Negative for visual changes. ENT: Negative for sore throat. Neck: No neck pain  Cardiovascular: Negative for chest pain. Respiratory: Negative for shortness of breath. Gastrointestinal: Negative for abdominal pain, vomiting or diarrhea. Genitourinary: + dysuria. Musculoskeletal: Negative for back pain. Skin: Negative for rash. Neurological: Negative for headaches, weakness or numbness. Psych: No SI or HI  ____________________________________________   PHYSICAL EXAM:  VITAL SIGNS: ED Triage Vitals  Enc Vitals Group     BP 09/04/18 1723 (!) 176/76     Pulse Rate 09/04/18 1723 (!) 102     Resp 09/04/18 1723 15     Temp --      Temp src --        SpO2 09/04/18 1720 96 %     Weight 09/04/18 1731 176 lb 8 oz (80.1 kg)     Height 09/04/18 1731 5' 1"  (1.549 m)     Head Circumference --      Peak Flow --      Pain Score 09/04/18 1727 10     Pain Loc --      Pain Edu? --      Excl. in GDane --     Constitutional: Alert and oriented. Well appearing and in no apparent distress. HEENT:      Head: Normocephalic and atraumatic.         Eyes: Conjunctivae are normal. Sclera is non-icteric.       Mouth/Throat: Mucous membranes are moist.       Neck: Supple with no signs of meningismus. Cardiovascular: Regular rate and rhythm. No murmurs, gallops, or rubs. 2+ symmetrical distal  pulses are present in all extremities. No JVD. Respiratory: Normal respiratory effort. Lungs are clear to auscultation bilaterally. No wheezes, crackles, or rhonchi.  Gastrointestinal: Soft, non tender, and non distended with positive bowel sounds. No rebound or guarding. Musculoskeletal: Nontender with normal range of motion in all extremities. No edema, cyanosis, or erythema of extremities. Neurologic: Normal speech and language. Face is symmetric. Moving all extremities. No gross focal neurologic deficits are appreciated. Skin: Skin is warm, dry and intact. No rash noted. Psychiatric: Mood and affect are normal. Speech and behavior are normal.  ____________________________________________   LABS (all labs ordered are listed, but only abnormal results are displayed)  Labs Reviewed  GLUCOSE, CAPILLARY - Abnormal; Notable for the following components:      Result Value   Glucose-Capillary 496 (*)    All other components within normal limits  CBC - Abnormal; Notable for the following components:   WBC 11.3 (*)    All other components within normal limits  BASIC METABOLIC PANEL - Abnormal; Notable for the following components:   Potassium 3.0 (*)    Chloride 97 (*)    Glucose, Bld 539 (*)    GFR calc non Af Amer 51 (*)    GFR calc Af Amer 59 (*)    All  other components within normal limits  URINALYSIS, COMPLETE (UACMP) WITH MICROSCOPIC - Abnormal; Notable for the following components:   Color, Urine YELLOW (*)    APPearance CLOUDY (*)    Glucose, UA >=500 (*)    Hgb urine dipstick SMALL (*)    Protein, ur 30 (*)    Nitrite POSITIVE (*)    Leukocytes, UA LARGE (*)    WBC, UA >50 (*)    Bacteria, UA RARE (*)    All other components within normal limits  GLUCOSE, CAPILLARY - Abnormal; Notable for the following components:   Glucose-Capillary 211 (*)    All other components within normal limits  URINE CULTURE  TROPONIN I   ____________________________________________  EKG  ED ECG REPORT I, Rudene Re, the attending physician, personally viewed and interpreted this ECG.   normal sinus rhythm, rate of 91, normal intervals, normal axis, no ST elevations or depressions, diffuse T-wave flattening. No significant changes when compared to prior. ____________________________________________  RADIOLOGY  none  ____________________________________________   PROCEDURES  Procedure(s) performed: None Procedures Critical Care performed:  None ____________________________________________   INITIAL IMPRESSION / ASSESSMENT AND PLAN / ED COURSE  84 y.o. female with a history of type 2 diabetes on glipizide and Januvia who presents from her primary care doctor's office for evaluation of hyperglycemia and few days of dysuria. Patient is extremely well appearing, in no distress, she has normal vital signs, abdomen is soft with no tenderness, no flank pain. Blood glucose here is 539 with no evidence of DKA. we'll check urinalysis to rule out UTI as possible cause of patient's increased blood glucose. We'll give IV fluids and IV insulin. Labs also show a mild hypokalemia for which she was supplemented by mouth.    _________________________ 10:21 PM on 09/04/2018 ----------------------------------------- UA positive for UTI with no signs  of sepsis. Patient was started on Rocephin and she is going to be discharged home on Keflex. She has cephalosporins listed as one of her allergies however there is a note saying the patient has received Rocephin and Omnicef in the past without any problems. She tolerated Rocephin well in the emergency room. She'll be discharged home on Keflex. There were no signs of DKA.  Her sugar improved to 211 after IV insulin and fluids. Recommend close follow-up with primary care doctor Monday to recheck her sugars. At this time hyperglycemia most likely due to untreated UTI. Discussed return precautions for flank pain, abdominal pain, nausea, vomiting, or fever.    As part of my medical decision making, I reviewed the following data within the [place] notes reviewed and incorporated, Labs reviewed , EKG interpreted , Old EKG reviewed, Old chart reviewed, Notes from prior ED visits and [place] Controlled Substance Database    Pertinent labs & imaging results that were available during my care of the patient were reviewed by me and considered in my medical decision making (see chart for details).    ____________________________________________   FINAL CLINICAL IMPRESSION(S) / ED DIAGNOSES  Final diagnoses:  Acute cystitis with hematuria  Hyperglycemia      NEW MEDICATIONS STARTED DURING THIS VISIT:  ED Discharge Orders         Ordered    cephALEXin (KEFLEX) 500 MG capsule  3 times daily,   Status:  Discontinued     09/04/18 2218    cephALEXin (KEFLEX) 500 MG capsule  3 times daily     09/04/18 2220           Note:  This document was prepared using Dragon voice recognition software and may include unintentional dictation errors.    Rudene Re, MD 09/04/18 2222

## 2018-09-04 NOTE — Telephone Encounter (Signed)
Noted I have sent in new prescriptions

## 2018-09-04 NOTE — Assessment & Plan Note (Addendum)
Uncontrolled.  Goal 8.5%. Cautious with glipizide . Thinking to start metformin and increase [place]uv[place]. See phone  Note.

## 2018-09-04 NOTE — Addendum Note (Signed)
Addended by: Allegra GranaARNETT, Tynasia Mccaul G on: 09/04/2018 04:57 PM   Modules accepted: Orders

## 2018-09-04 NOTE — Progress Notes (Addendum)
Subjective:    Patient ID: Alexandra English, female    DOB: 1927-10-03, 84 y.o.   MRN: [id number]  CC: Alexandra English is a 84 y.o. female who presents today for follow up.   HPI: Thinks may another UTI 'coming on.' 'a little fatigue.'   Noticed BLE swelling today. New for her. No sob. Sleep in a recliner because more comfortable. Tried compression stockings in the past and didn't like.   No fever, chills, dysuria, urinary frequency, confusion.   Notes a fall a month or two ago. Doest walk much anymore.Uses walker sometimes. Doesn't use cane.  No LOC, head injury. Using motorized wheelchair.Lives at [facility] Apartment by herself. Doesn't do any cooking. Did PT there about a month.  No concerns with memory.   HTN- they dont check at the assisted living. Needs refill of clonidine. Denies exertional chest pain or pressure, numbness or tingling radiating to left arm or jaw, palpitations, dizziness, frequent headaches, changes in vision, or shortness of breath.   DM- meter is out. Not checking blood sugars. Taking januvia, glipizide. Doesn't think high.   Hypothyroidism- taking synthroid.  Depression- on remeron. No si/hi. Less tearful now.     eGFR is not less than 45. Could start metformin. Increase januvia 69m qd.   Hospitalized 07/23/18 for confusion.discharged 07/23/18.  UTI, IV ciprofloxacin HTN- uncontrolled, lost clonidine at that time;  DM- trajenta, a1c 07/2018 11  Urine culture showed e coli  HISTORY:  Past Medical History:  Diagnosis Date  . Diabetes mellitus 04/2009   HgbA1c 7.1% 07/2009, Monofilament normal, Ortho pending  . Fracture    of rib  . Glaucoma    Dr. DThomasene Ripple . Hypertension   . Sleep apnea    Per Pt. was supposed to start CPAP, however reluctant to try  . Stroke ([facility]    Left frontal4/2010, Left parietal and right parietal 04/2011 , Started on Plavix and then changed to Coumadin by recommendation of Dr. CCarlis Abbott . Thyroid disease    hypothyroidism    Past Surgical History:  Procedure Laterality Date  . orthopedic surgeon     Dr. DLatanya Maudlin . TOTAL ABDOMINAL HYSTERECTOMY    . TOTAL SHOULDER REPLACEMENT Right   . WRIST SURGERY Left    Family History  Problem Relation Age of Onset  . CAD Mother   . CAD Father   . Diabetes Mellitus I Brother   . Diabetes Mellitus II Maternal Grandmother     Allergies: Beta adrenergic blockers; Cephalosporins; Lisinopril; Macrobid [nitrofurantoin macrocrystal]; Toprol xl [metoprolol succinate]; and Penicillins Current Outpatient Medications on File Prior to Visit  Medication Sig Dispense Refill  . amLODipine (NORVASC) 5 MG tablet Take 1 tablet (5 mg total) by mouth daily. 90 tablet 0  . aspirin 81 MG tablet Take 81 mg by mouth daily.      .Marland Kitchenatorvastatin (LIPITOR) 40 MG tablet TAKE ONE TABLET BY MOUTH ONCE DAILY 90 tablet 3  . Blood Glucose Monitoring Suppl (ONE TOUCH ULTRA SYSTEM KIT) W/DEVICE KIT 1 kit by Does not apply route once. 1 each 0  . glipiZIDE (GLUCOTROL) 5 MG tablet TAKE 1 TABLET BY MOUTH TWICE DAILY BEFORE A MEAL 60 tablet 2  . glucose blood (ONE TOUCH ULTRA TEST) test strip USE ONE STRIP TO CHECK GLUCOSE TWICE DAILY E11.8 100 each 2  . JANUVIA 25 MG tablet TAKE 1 TABLET BY MOUTH ONCE DAILY 90 tablet 1  . Lancets (ONETOUCH ULTRASOFT) lancets Use as instructed 100 each 3  .  levothyroxine (SYNTHROID, LEVOTHROID) 75 MCG tablet Take 1 tablet (75 mcg total) by mouth daily. 90 tablet 0  . omeprazole (PRILOSEC) 20 MG capsule TAKE ONE CAPSULE BY MOUTH ONCE DAILY 30 capsule 11   No current facility-administered medications on file prior to visit.     Social History   Tobacco Use  . Smoking status: Never Smoker  . Smokeless tobacco: Never Used  Substance Use Topics  . Alcohol use: No  . Drug use: No    Review of Systems  Constitutional: Positive for fatigue. Negative for chills, fever and unexpected weight change.  HENT: Negative for congestion.   Respiratory: Negative for cough,  shortness of breath and wheezing.   Cardiovascular: Positive for leg swelling. Negative for chest pain and palpitations.  Gastrointestinal: Negative for nausea and vomiting.  Genitourinary: Negative for difficulty urinating, dysuria and frequency.      Objective:    BP (!) 166/84 (BP Location: Left Arm, Patient Position: Sitting, Cuff Size: Normal)   Pulse 88   Temp 98 F (36.7 C) (Oral)   Resp 15   Ht 5' 1"  (1.549 m)   Wt 176 lb 8 oz (80.1 kg)   SpO2 96%   BMI 33.35 kg/m  BP Readings from Last 3 Encounters:  09/04/18 (!) 166/84  07/23/18 (!) 155/66  07/23/18 (!) 139/48   Wt Readings from Last 3 Encounters:  09/04/18 176 lb 8 oz (80.1 kg)  07/23/18 182 lb 12.2 oz (82.9 kg)  07/22/18 190 lb (86.2 kg)    Physical Exam  Constitutional: She appears well-developed and well-nourished.  Cardiovascular: Normal rate, regular rhythm, normal heart sounds and normal pulses.  +1 pitting BLE edema.   No palpable cords or masses. No erythema or increased warmth. No asymmetry in calf size when compared bilaterally LE hair growth symmetric and present. No discoloration of varicosities noted. LE warm and palpable pedal pulses.   Pulmonary/Chest: Effort normal and breath sounds normal. She has no wheezes. She has no rhonchi. She has no rales.  Abdominal: There is no CVA tenderness.  Neurological: She is alert.  Alert to month, year, office. Not day.recalls president's name.    Skin: Skin is warm and dry.  Psychiatric: She has a normal mood and affect. Her speech is normal and behavior is normal. Thought content normal.  Vitals reviewed.      Assessment & Plan:   Problem List Items Addressed This Visit      Cardiovascular and Mediastinum   Hypertension    Elevated today. Given refill of clonidine and also new rx for BP cuff.       Relevant Medications   cloNIDine (CATAPRES) 0.2 MG tablet   Other Relevant Orders   For home use only DME Other see comment   Ambulatory referral  to [place]   DM (diabetes mellitus), type 2 with complications ([place])    Uncontrolled.  Goal 8.5%. Cautious with glipizide . Thinking to start metformin and increase [place]. See phone  Note.       Relevant Orders   Ambulatory referral to [facility]     Other   Anxiety and depression    Will increase remeron. Close follow up.       Relevant Medications   mirtazapine (REMERON) 45 MG tablet   Fall    Recurrent Pending home health falls assessment/PT.       Other fatigue - Primary    Nonspecific. Patient well appearing. Not toxic in appearance.  Some concerns with memory as she had trouble remember day of month. Mini Mental was 21/28.  Pending urine studies due to h/o recurrent UTIs however no overt urine symptoms.       Relevant Orders   CBC with Differential/Platelet   Comprehensive metabolic panel   Ambulatory referral to Home Health   Urinalysis, Routine w reflex microscopic   Urine Microalbumin w/creat. ratio   Urine Culture   Leg swelling    New ( to me). She declines compression stockings. No orthoapnea, adventitious lung sounds. Pending echo ( last 2017) and BNP. Weight loss ( no weight gain). Close follow up.       Relevant Orders   B Nat Peptide   ECHOCARDIOGRAM COMPLETE   DME Other see comment   Ambulatory referral to [place] for breast cancer    Messaged CMA to schedule.      Relevant Orders   MM 3D SCREEN BREAST BILATERAL   Memory changes    Mild dementia based on MMSE. Referral to neurology      Relevant Orders   Ambulatory referral to Neurology    Other Visit Diagnoses    Encounter for immunization       Relevant Orders   Flu vaccine HIGH DOSE PF (Completed)       I have discontinued Tyla L. Suleiman's mirtazapine and ciprofloxacin. I have also changed her cloNIDine. Additionally, I am having her start on mirtazapine. Lastly, I am having her maintain her aspirin, ONE TOUCH ULTRA SYSTEM KIT, onetouch ultrasoft,  glucose blood, glipiZIDE, atorvastatin, omeprazole, JANUVIA, levothyroxine, and amLODipine.   Meds ordered this encounter  Medications  . cloNIDine (CATAPRES) 0.2 MG tablet    Sig: Take 1 tablet (0.2 mg total) by mouth at bedtime.    Dispense:  90 tablet    Refill:  0  . mirtazapine (REMERON) 45 MG tablet    Sig: Take 1 tablet (45 mg total) by mouth at bedtime.    Dispense:  90 tablet    Refill:  1    Order Specific Question:   Supervising Provider    Answer:   Crecencio Mc [[id number]]    Return precautions given.   Risks, benefits, and alternatives of the medications and treatment plan prescribed today were discussed, and patient expressed understanding.   Education regarding symptom management and diagnosis given to patient on AVS.  Continue to follow with Burnard Hawthorne, FNP for routine health maintenance.   [name] and I agreed with plan.   [name], FNP   I have reviewed the above information and agree with above.   Deborra Medina, MD

## 2018-09-04 NOTE — ED Notes (Signed)
Date and time results received: 09/04/18 6:12 PM Test: blood glucose Critical Value: 539  Name of Provider Notified: dr. Don Perkingveronese  Orders Received? Or Actions Taken?: tbd

## 2018-09-04 NOTE — Assessment & Plan Note (Signed)
Messaged CMA to schedule.

## 2018-09-07 ENCOUNTER — Telehealth: Payer: Self-pay | Admitting: Family

## 2018-09-07 DIAGNOSIS — Z794 Long term (current) use of insulin: Principal | ICD-10-CM

## 2018-09-07 DIAGNOSIS — E118 Type 2 diabetes mellitus with unspecified complications: Secondary | ICD-10-CM

## 2018-09-07 LAB — URINE CULTURE: Culture: >=100000 — AB

## 2018-09-07 NOTE — Telephone Encounter (Signed)
Spoke with Radio broadcast assistantChristy RN at [facility] [phone] and she stated that RN has been assigned . She is not sure if RN has went out to see patient today she will check and let me know, if not they will not be able to go out until Wednesday.   PT will be going out tomorrow.   Orders have been placed for labs for today to be drawn by home health.

## 2018-09-07 NOTE — Telephone Encounter (Signed)
reviewed urine culture  she is on keflex which it is sensitive too.   Has she started metformin or increased [place]ajanuvia yet? I want to get her sugars under control  Overall, does she feel closer to her baseline?  Also,[facility] can do labs today so we can check blood sugar and potassium. Please advise a BMP. We do NOT need urine.    However I do want to know when home health services will start. Can you call them to see when they will come see her?

## 2018-09-08 ENCOUNTER — Telehealth: Payer: Self-pay

## 2018-09-08 ENCOUNTER — Telehealth: Payer: Self-pay | Admitting: Family

## 2018-09-08 NOTE — Telephone Encounter (Signed)
[facility]

## 2018-09-08 NOTE — Telephone Encounter (Signed)
Please give verbal for PT . Thank you

## 2018-09-08 NOTE — Progress Notes (Signed)
Mammogram appointment scheduled

## 2018-09-08 NOTE — Telephone Encounter (Signed)
Shelly FlattenLemuel [facility] [phone] called for verbal orders for PT, orders given.

## 2018-09-08 NOTE — Telephone Encounter (Unsigned)
Copied from CRM [id number]. Topic: General - Other >> Sep 08, 2018  3:30 PM Percival SpanishKennedy, Cheryl W wrote:  Clayton LefortLemuel [facility] to req PT orders 2 x 4 and 1 x 2

## 2018-09-08 NOTE — Telephone Encounter (Signed)
Verbal orders for PT was given to [facility] at [facility]

## 2018-09-09 NOTE — Telephone Encounter (Signed)
Left message for Neysa Bonito RN at [facility] to call , please transfer to office . Thanks

## 2018-09-09 NOTE — Telephone Encounter (Signed)
Call pt  How are sugars since new DM medication regimen ( see below notes)?   Does she feel well?  ADvise pt to let us know if home health doesn't draw labs today. We want to ensure her potassium normal

## 2018-09-11 NOTE — Telephone Encounter (Signed)
Left voicemail to call to get more information from [place] , [place]. I will go ahead and forward message to [facility] and see what she says.

## 2018-09-11 NOTE — Telephone Encounter (Signed)
FYI

## 2018-09-11 NOTE — Telephone Encounter (Signed)
Call pt What is misunderstanding? Does she not want nursing?  What are her concerns?  I would like labs done to ensure potassium back in normal range

## 2018-09-11 NOTE — Telephone Encounter (Signed)
Lawson Fiscal, RN with [facility] called and said the patient does want to have Social Work or Sales executive. Her labs have not been done yet because she was going to do them today but the patient will not let her come. Patient has nobody to help her. If you need to call Lawson Fiscal back you can, [phone]

## 2018-09-11 NOTE — Telephone Encounter (Signed)
Tried calling patient , no answer , no answering machine.   Spoke to [employer] at [place] and she declined to let her do anything , bloodwork , she tried to explain  to patient that bloodwork   would help to check her sugar and potassium level due her potassium level had been low.  She has not been checking blood sugars.  Social worker would help with all resources but patient refused.   RN  doesn't feel she is safe for living independent.   She spoke with director and they company in the place they will check blood sugar for a charge of $7.00 .    RN  is not sure that she allowed PT to come in .   She will let us know

## 2018-09-14 NOTE — Telephone Encounter (Signed)
No answer to home number or mobile for [place]  Number for barry is disconnected    left message for demita  Spoke with lemuel hasnt seen pt since initial visit;   I also left a message with Lawson Fiscal, RN   Olegario Messier, what else would you suggest? I just want to confirm patient is okay

## 2018-09-14 NOTE — Telephone Encounter (Signed)
[place] police and they are going out for wellness check and will return call to let us know if patient is OK , if not they will call EMS.

## 2018-09-14 NOTE — Telephone Encounter (Signed)
Let's send sherriff out for now and ensure patient is okay

## 2018-09-14 NOTE — Telephone Encounter (Signed)
If RN is stating she feels patient is unsafe to live alone then DSS should be the next step.

## 2018-09-15 NOTE — Telephone Encounter (Signed)
Called DSS this morning no answer left Urgent message.

## 2018-09-15 NOTE — Telephone Encounter (Signed)
DSS has been contacted with PCP concerns and they will notify us when investigation is complete by letter and phone of outcome.

## 2018-09-16 NOTE — Telephone Encounter (Signed)
Jacki Cones with [facility] is returning call to [facility] regarding patient.  She said that they could not reach the patient either, and the PT Lumuel will be doing a drive by the patient's residence to see if they can reach the patient.  Please advise.  CB# [phone].

## 2018-09-16 NOTE — Telephone Encounter (Signed)
Spoke with Clarise Cruz [phone],  She tried calling patient and was unable to reach patient.   See below message.   PT is going out to see patient today.

## 2018-09-16 NOTE — Telephone Encounter (Signed)
Alexandra English,  Thank you for initiating DSS  [place] Police would have called [place] if any problem, correct? Or taken her to ED ( which I do not see in chart).

## 2018-09-16 NOTE — Telephone Encounter (Signed)
I was advised by [facility] PD they would take to ED or contact EMS

## 2018-09-17 ENCOUNTER — Telehealth: Payer: Self-pay

## 2018-09-17 DIAGNOSIS — E118 Type 2 diabetes mellitus with unspecified complications: Secondary | ICD-10-CM

## 2018-09-17 NOTE — Telephone Encounter (Signed)
Lori from [place] called and stated that PT saw patient yesterday on 09/16/18. She went to see patient today and she had no idea why she was there  And she was confused. She refused to let her draw her blood. She is not checking blood sugars . She again tried to re-educate patient on why she was coming out to draw blood and why she needed to check blood sugars.  Patients blood pressure was ok when she checked it today.  Patient thinks that they're trying to place her somewhere. Lawson Fiscal , RN thinks it is unsafe for patient to live alone. She will go back out for visit tomorrow and re-visit patient and try again to draw blood.

## 2018-09-18 ENCOUNTER — Ambulatory Visit
Admission: RE | Admit: 2018-09-18 | Discharge: 2018-09-18 | Disposition: A | Discharge status: Home / Self Care | Payer: Medicare Other | Source: Ambulatory Visit | Attending: Family | Admitting: Family

## 2018-09-18 DIAGNOSIS — F419 Anxiety disorder, unspecified: Secondary | ICD-10-CM

## 2018-09-18 DIAGNOSIS — E1142 Type 2 diabetes mellitus with diabetic polyneuropathy: Secondary | ICD-10-CM

## 2018-09-18 DIAGNOSIS — B9620 Unspecified Escherichia coli [E. coli] as the cause of diseases classified elsewhere: Secondary | ICD-10-CM

## 2018-09-18 DIAGNOSIS — K219 Gastro-esophageal reflux disease without esophagitis: Secondary | ICD-10-CM

## 2018-09-18 DIAGNOSIS — Z9181 History of falling: Secondary | ICD-10-CM

## 2018-09-18 DIAGNOSIS — F0280 Dementia in other diseases classified elsewhere without behavioral disturbance: Secondary | ICD-10-CM

## 2018-09-18 DIAGNOSIS — E1165 Type 2 diabetes mellitus with hyperglycemia: Secondary | ICD-10-CM

## 2018-09-18 DIAGNOSIS — Z1231 Encounter for screening mammogram for malignant neoplasm of breast: Secondary | ICD-10-CM | POA: Insufficient documentation

## 2018-09-18 DIAGNOSIS — F329 Major depressive disorder, single episode, unspecified: Secondary | ICD-10-CM

## 2018-09-18 DIAGNOSIS — N390 Urinary tract infection, site not specified: Secondary | ICD-10-CM

## 2018-09-18 DIAGNOSIS — G4730 Sleep apnea, unspecified: Secondary | ICD-10-CM

## 2018-09-18 DIAGNOSIS — E039 Hypothyroidism, unspecified: Secondary | ICD-10-CM

## 2018-09-18 DIAGNOSIS — Z8673 Personal history of transient ischemic attack (TIA), and cerebral infarction without residual deficits: Secondary | ICD-10-CM

## 2018-09-18 DIAGNOSIS — Z1239 Encounter for other screening for malignant neoplasm of breast: Secondary | ICD-10-CM

## 2018-09-18 DIAGNOSIS — Z7984 Long term (current) use of oral hypoglycemic drugs: Secondary | ICD-10-CM

## 2018-09-18 DIAGNOSIS — H409 Unspecified glaucoma: Secondary | ICD-10-CM

## 2018-09-18 DIAGNOSIS — I10 Essential (primary) hypertension: Secondary | ICD-10-CM

## 2018-09-23 ENCOUNTER — Telehealth: Payer: Self-pay | Admitting: Family

## 2018-09-23 DIAGNOSIS — I10 Essential (primary) hypertension: Secondary | ICD-10-CM

## 2018-09-23 MED ORDER — ONETOUCH ULTRASOFT LANCETS MISC: 3 refills | Status: DC

## 2018-09-23 MED ORDER — GLUCOSE BLOOD VI STRP: ORAL_STRIP | 2 refills | Status: AC

## 2018-09-23 NOTE — Telephone Encounter (Signed)
Copied from CRM [id number]. Topic: General - Other >> Sep 23, 2018  4:42 PM Tamela Oddi wrote: Reason for CRM: Clayton Lefort, PT from [facility] called to give report of patient.  BP today was 180/86, patient stated that she had no other symptoms.  Please advise.  CB# [phone]

## 2018-09-23 NOTE — Telephone Encounter (Signed)
Corrected script for glucose montior order placed , faxed

## 2018-09-23 NOTE — Telephone Encounter (Signed)
BP Readings from Last 3 Encounters:  09/04/18 (!) 164/82  09/04/18 (!) 166/84  07/23/18 (!) 155/66   Kristen,  Below are notes for myself  Would you call pt and see how she feels in am? Ask her if she has heard from [facility] and let her know she may have to pay for BP machine but I really advise she gets one.     Telephone note:   Spoke with PT.  170/78 after exercises for PT.    Has taken amlodipine today.   Advised to take clonidine however she wanted to wait until she has supper. She states will clonidine at 7pm when returns from supper. She states not CP, sob, palpitations  She states no one there to check blood pressure at apartment. Advised any cp, sob, dizziness , ha, vision changes to call 911.   I have ordered cuff and will have [facility] deliver tomorrow  Spoke with pharmacy- they have filled glucometer and will call pt regarding price of BP machine.

## 2018-09-23 NOTE — Telephone Encounter (Signed)
  Kristen,   Call [facility] and ask to deliver glucometer, strips, lancets.   Spoke with lori. She cannot check blood pressure. Will check blood pressure. Has been 122/60. will increase visits to bid.   Will help with DM education. I have prescribed glucometer .   Spoke with patient  Doing PT now, working on balance.   NO falls.   Feels like home health is helpful  Doesn't have  Glucometer. Not checking blood sugars or blood pressure. No CP.   Call Lawson Fiscal- ask to bring a glucometer and check blood sugars.   Not speaking with son currently.   No new concerns today. No dysuria, frequency.  Feels well.    Sees me 10/07/18 .

## 2018-09-28 NOTE — Telephone Encounter (Signed)
Called patient and she is unsure about glucometer an blood pressure machine. Unsure about glucometer she wants me to call home health RN to verify this issue. Called Christy,RN she will direct me to RN who went to visit patient today.

## 2018-09-29 NOTE — Telephone Encounter (Signed)
Left voicemail for Alexandra English [phone]  to call back to follow up to if she could have Home Health RN who visited patient could call me back in regards to visit   yesterday and if patient received glucometer and was instructed on how to use and if she got correct supplies . Patient was unsure of when I spoke with her about her glucometer .   Also wanted to ensure if she got her blood pressure monitor .

## 2018-09-29 NOTE — Telephone Encounter (Signed)
Spoke with Neysa Bonito, RN from [place] she will have Heriberto Antigua from [place] call me back in regards to patient

## 2018-09-29 NOTE — Telephone Encounter (Signed)
Spoke with Alexandra English , she states that patient is unable to check her blood sugar it is too complicated , too many steps for her .   Wondering if she would qualify for [place] sensor . Her blood pressure is running in the 120-130s

## 2018-09-30 ENCOUNTER — Ambulatory Visit: Payer: Self-pay

## 2018-09-30 NOTE — Telephone Encounter (Signed)
Attempted mobil number No VM message did not match any number on pt contact list

## 2018-09-30 NOTE — Telephone Encounter (Signed)
Attempted to contact pt via phone. Unable to leave VM Message was enter access code.

## 2018-09-30 NOTE — Telephone Encounter (Signed)
Clayton Lefort, physical therapist is calling to report both of patients lower legs are swollen below the knee ,and there is increased redness below mid shin to ankle area. Pt reports that pt has been refusing to walk due to a upset stomach . Pt  has also been refusing to do the standing and walking during visits and reports that her blood sugar was 266 when checked today. Lemuelis currently not with the patient and was calling after he had already left her home. Clayton Lefort can be reached at [phone].  Attempted to call pt x 3 to discuss current symptoms but no answer.

## 2018-10-01 NOTE — Telephone Encounter (Signed)
Per verbal orders from Claris Che Arnett,FNP can call in Bactroban ointment for legs Also per Claris Che , NP patient  can increase her Metformin XR to 1000 mg daily to help control her blood sugar. Need to have home health RN go out and assess leg swelling.  Also called Neysa Bonito, RN  [facility] will have RN go out for assessment of legs , patient needs to elevate legs and can apply Bactroban ointment.   Margit Hanks , RN at [facility] [phone] she is unable to see patient today to assess her leg bilateral leg swelling below the legs. She advised me to call office and see if there is a nurse that can go out and see patient today.   Called and spoke with Kriste Basque RN at [facility] and Gave orders for RN to go out and assess patient legs for redness and swelling  , and to increase Metformin XR to 1000 mg daily. Advised that Claris Che , FNP has gave verbally orders for script for Bactroban ointment script is pended to be sent to pharmacy.  RN will contact office back with report on assessment to get orders from Leotis Shames , NP.

## 2018-10-01 NOTE — Telephone Encounter (Signed)
Spoke with Clayton Lefort PT and he states that he just saw patient this week, legs swollen below knee swelling and increased redness below shin to ankle symptoms wasn't present last week.   See below message Patient is also having memory issues.    Attempted to call patient no answer. LM on son's voicemail to call .

## 2018-10-02 MED ORDER — MUPIROCIN 2 % EX OINT: 1.0000 "application " | TOPICAL_OINTMENT | Freq: Three times a day (TID) | CUTANEOUS | 0 refills | Status: AC

## 2018-10-02 MED ORDER — MUPIROCIN 2 % EX OINT: 1.0000 "application " | TOPICAL_OINTMENT | Freq: Three times a day (TID) | CUTANEOUS | 0 refills | Status: DC

## 2018-10-02 NOTE — Telephone Encounter (Signed)
Spoke with Lawson Fiscal , RN [facility] [phone] she went out yesterday and legs mildly red are peak no pitting edema . No cellulitis. She is just laying in bed.   She is unable to check blood sugar. Do you think patient will qualify for [place] meter?  Blood pressure 120s blood pressure ok

## 2018-10-02 NOTE — Telephone Encounter (Signed)
Alexandra English,   Correct, Medicare will not cover Freestyle Alexandra English unless the patient is taking at least 3 insulin injections and checking BG at least 4 times daily.   How often is she checking BG right now? If checking is a significant burden, I would suggest only needing to check 2-3 fastings a week to make sure there's some progress being made on BG control. How often is the [facility] RN coming to her house? Is this something that could just be checked when [facility] RN is there?   Catie Feliz Beam, PharmD PGY2 Ambulatory Care Pharmacy Resident, [facility] Phone: [phone]

## 2018-10-02 NOTE — Addendum Note (Signed)
Addended by: Alisia Ferrari on: 10/02/2018 12:06 PM   Modules accepted: Orders

## 2018-10-02 NOTE — Telephone Encounter (Signed)
Catie,  Home  Health mentioned a libre sensor.   I thought patients had to be on insulin for that?

## 2018-10-02 NOTE — Telephone Encounter (Signed)
Noted  Alexandra English, would you try calling patient again today to get objective info regarding her legs?

## 2018-10-05 NOTE — Telephone Encounter (Signed)
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 That is one of the reasons I wanted home health to begin with.  She doesn't qualify for [employer].   If checking is a significant burden, I would suggest only needing to check 2-3 fastings a week to make sure there's some progress being made on BG control.    If nurse is not there to make sense to check fasting then she can check a post prandial which should be less than 160 approx 2 hours after a meal.   fbg < 120 are goal.   In her age group, we can loosen those goals upward slightly.

## 2018-10-06 NOTE — Telephone Encounter (Signed)
Spoke with Lawson Fiscal , RN [facility] [phone] she states currently they are only going out to see patient 1time a week. She can see if they can go out twice a week. Advised of goals for blood sugars. Lawson Fiscal states patient doesn't have correct lancets to place in lancing device. Called and spoke with [facility] and they stated to have Home Health look at lancing device to see if it said what type of device it was ie one touch ultra and we could send in correct script. Called back and advised Lawson Fiscal , RN of this see will contact me back after she visits with patient on Wednesday or Thursday.

## 2018-10-07 ENCOUNTER — Ambulatory Visit: Payer: Medicare Other | Admitting: Family

## 2018-10-07 ENCOUNTER — Telehealth: Payer: Self-pay | Admitting: Family

## 2018-10-07 DIAGNOSIS — E118 Type 2 diabetes mellitus with unspecified complications: Secondary | ICD-10-CM

## 2018-10-07 DIAGNOSIS — Z794 Long term (current) use of insulin: Principal | ICD-10-CM

## 2018-10-07 NOTE — Telephone Encounter (Signed)
Copied from CRM [id number]. Topic: Quick Communication - See Telephone Encounter >> Oct 07, 2018  4:45 PM Lorrine Kin, [place] wrote: CRM for notification. See Telephone encounter for: 10/07/18. Lemuel with [facility] calling to report that patient's blood glucose was 278. States that the patient just ate something a while ago. States that the redness on her legs are not worse. Appears to be less than last week when he saw her. CB#: [phone]

## 2018-10-07 NOTE — Telephone Encounter (Signed)
Tried calling patient no answer , no voicemail . Spoke with [place] and she did exercise today. Spoke with Lemeul he states patient wasn't having blurring vision or dizziness. He also states patient is planning on moving in with her son. Spoke with Dr Darrick Huntsman she states it isn't emergent blood sugar reading since 2 hour post prandial reading. Please advise.

## 2018-10-12 MED ORDER — METFORMIN HCL ER 500 MG PO TB24: ORAL_TABLET | ORAL | 1 refills | Status: AC

## 2018-10-12 MED ORDER — ONETOUCH DELICA PLUS LANCET30G MISC: 1.0000 | Freq: Every day | 5 refills | Status: AC

## 2018-10-12 NOTE — Telephone Encounter (Signed)
New script for lancets script sent per request from [facility]

## 2018-10-12 NOTE — Telephone Encounter (Signed)
Called patient advised her that she missed appointment . She states that she didn't realize she missed it. Advised her we needed to re-schedule she states she is unable to  due to [place] driver is no longer there. She is taking 1000 mg Metformin.   She will call back to schedule appointment as soon as she can find transportation.

## 2018-10-12 NOTE — Telephone Encounter (Signed)
Call pt  We missed seeing  Her last week  What dose of metformin is she on?  Is she taking 1000mg  daily?  Please update medications. Do we need to send into total care?   We can also increase her [place] however I would like her to come in for labs first.  Her last labs were ED and she had significant abnormalities   When does she move in with her son?

## 2018-10-13 ENCOUNTER — Telehealth: Payer: Self-pay | Admitting: Family

## 2018-10-13 NOTE — Telephone Encounter (Signed)
Copied from CRM [id number]. Topic: Quick Communication - See Telephone Encounter >> Oct 13, 2018  1:09 PM Floria Raveling A wrote: CRM for notification. See Telephone encounter for: 10/13/18.  Adult protective services [phone]   She has some concerns she would like to talk to [employer] about.

## 2018-10-13 NOTE — Telephone Encounter (Signed)
I spoke with [facility] & provided the info she needed.

## 2018-10-14 ENCOUNTER — Telehealth: Payer: Self-pay | Admitting: Family

## 2018-10-14 DIAGNOSIS — E118 Type 2 diabetes mellitus with unspecified complications: Secondary | ICD-10-CM

## 2018-10-14 DIAGNOSIS — Z794 Long term (current) use of insulin: Principal | ICD-10-CM

## 2018-10-14 MED ORDER — SITAGLIPTIN PHOSPHATE 100 MG PO TABS: 100.0000 mg | ORAL_TABLET | Freq: Every day | ORAL | 1 refills | Status: AC

## 2018-10-14 NOTE — Telephone Encounter (Deleted)
Copied from CRM #[id number]. Topic: Quick Communication - See Telephone Encounter °>> Oct 07, 2018  4:45 PM McGee, Demi B, NT wrote: °CRM for notification. See Telephone encounter for: 10/07/18. °Alexandra with [facility] calling to report that patient's blood glucose was 278. States that the patient just ate something a while ago. States that the redness on her legs are not worse. Appears to be less than last week when he saw her. °CB#: [phone] °>> Oct 14, 2018  3:41 PM Walter, Linda F wrote: °Alexandra English w/[facility] [phone] wanted the provider to know that the patient does not want to have physical therapy anymore.  So she stopped it as of today. °

## 2018-10-14 NOTE — Telephone Encounter (Signed)
Call pt  Blood sugars are 278.   2 hours Post prandial should be less than 180.    Increase jardiance from 50mg  qd to 100mg . I sent to pharmacy  CrtCl 51.

## 2018-10-14 NOTE — Telephone Encounter (Signed)
Copied from CRM [id number]. Topic: Quick Communication - See Telephone Encounter >> Oct 07, 2018  4:45 PM Lorrine Kin, [place] wrote: CRM for notification. See Telephone encounter for: 10/07/18. Lemuel with [facility] calling to report that patient's blood glucose was 278. States that the patient just ate something a while ago. States that the redness on her legs are not worse. Appears to be less than last week when he saw her. CB#: [phone] >> Oct 14, 2018  3:41 PM Trula Slade wrote: Melinda Crutch w/[facility] [phone] wanted the provider to know that the patient does not want to have physical therapy anymore.  So she stopped it as of today.

## 2018-10-15 NOTE — Telephone Encounter (Signed)
Tried calling patient --no answer

## 2018-10-16 NOTE — Telephone Encounter (Signed)
Spoke to [facility] they haven't delivered Januvia 100 mg to patient . It will require prior authorization .

## 2018-10-16 NOTE — Telephone Encounter (Signed)
Spoke to patient and advised of below.

## 2018-10-22 ENCOUNTER — Telehealth: Payer: Self-pay | Admitting: Family

## 2018-10-22 DIAGNOSIS — Z794 Long term (current) use of insulin: Principal | ICD-10-CM

## 2018-10-22 DIAGNOSIS — E118 Type 2 diabetes mellitus with unspecified complications: Secondary | ICD-10-CM

## 2018-10-22 NOTE — Telephone Encounter (Signed)
Copied from CRM [id number]. Topic: Quick Communication - Rx Refill/Question >> Oct 22, 2018 11:06 AM Fanny Bien wrote: Medication: levothyroxine (SYNTHROID, LEVOTHROID) 75 MCG tablet [[id number] pt son called and stated that he has moved his mother with him to [facility] and would like to know if we can get this refilled until a new PCP is found. Pt would like a call back if any issues occur   Has the patient contacted their pharmacy? no Preferred Pharmacy (with phone number or street name): [facility] [place] [place] - [address] [phone] (Phone) [phone] (Fax)  Agent: Please be advised that RX refills may take up to 3 business days. We ask that you follow-up with your pharmacy.

## 2018-10-22 NOTE — Telephone Encounter (Signed)
Please advise 

## 2018-10-26 ENCOUNTER — Other Ambulatory Visit: Payer: Self-pay

## 2018-10-26 MED ORDER — LEVOTHYROXINE SODIUM 75 MCG PO TABS: 75.0000 ug | ORAL_TABLET | Freq: Every day | ORAL | 0 refills | Status: DC

## 2018-10-26 MED ORDER — LEVOTHYROXINE SODIUM 75 MCG PO TABS: 75.0000 ug | ORAL_TABLET | Freq: Every day | ORAL | 1 refills | Status: AC

## 2018-10-26 NOTE — Telephone Encounter (Signed)
tsh normal 07/2018

## 2018-10-26 NOTE — Telephone Encounter (Signed)
Refilled levothyroxine per Claris Che, NP.  See below documentation.  Patients son advised medication refilled

## 2018-10-26 NOTE — Telephone Encounter (Signed)
See telephone encounter 10/26/18 , requesting refill , patient has moved to [facility] with son.

## 2018-10-28 ENCOUNTER — Telehealth: Payer: Self-pay | Admitting: Family

## 2018-10-28 NOTE — Telephone Encounter (Signed)
Spoke with patient's son Gery PrayBarry regarding AWV. Patient declined. Gery PrayBarry stated that Kathie RhodesBetty has recently moved to [place] with him and will be changing providers. SF

## 2018-12-07 ENCOUNTER — Other Ambulatory Visit: Payer: Self-pay | Admitting: Family

## 2018-12-07 DIAGNOSIS — I10 Essential (primary) hypertension: Secondary | ICD-10-CM

## 2018-12-07 NOTE — Telephone Encounter (Signed)
Copied from CRM [id number]. Topic: Quick Communication - Rx Refill/Question >> Dec 07, 2018  2:18 PM Gerrianne ScalePayne, Leron Stoffers L wrote: Medication: cloNIDine (CATAPRES) 0.2 MG tablet Pt moved to a new area   with son  Pt has a new pt appt with new provider in 10 days   Has the patient contacted their pharmacy? No  (Agent: If no, request that the patient contact the pharmacy for the refill.) (Agent: If yes, when and what did the pharmacy advise?)  Preferred Pharmacy (with phone number or street name): [facility] [place], [place] - [address] [phone] (Phone) [phone])    Agent: Please be advised that RX refills may take up to 3 business days. We ask that you follow-up with your pharmacy.

## 2018-12-07 NOTE — Telephone Encounter (Signed)
Called pt and spoke son regarding the request for clonidine. The patient has an appointment with the new provider in 10 days and is asking to refill this medication until then.  Please give him a call back if this can not be done. LR 09/04/18 for 90 tabs.

## 2018-12-08 NOTE — Telephone Encounter (Signed)
I tried to call patient, but was unable to reach.

## 2018-12-11 MED ORDER — CLONIDINE HCL 0.2 MG PO TABS: 0.2000 mg | ORAL_TABLET | Freq: Every day | ORAL | 0 refills | Status: AC

## 2018-12-11 NOTE — Telephone Encounter (Signed)
Call son Advise I sent in 30 day script Please wish him and his mother the best in [place]

## 2018-12-11 NOTE — Telephone Encounter (Signed)
I spoke with son & he stated that he was very appreciative of all you had done for his mom.

## 2018-12-30 ENCOUNTER — Other Ambulatory Visit: Payer: Self-pay | Admitting: Family

## 2019-03-17 ENCOUNTER — Telehealth: Payer: Self-pay | Admitting: Family Medicine

## 2019-03-17 NOTE — Telephone Encounter (Signed)
Patient needs appt sometime soon (next few months)  For follow up on diabetes, BP, thyroid, lipids  Due for:  A1c  Thyroid panel  Lipid panel  Tdap  Opthalmology exam for diabetics.

## 2019-03-17 NOTE — Telephone Encounter (Signed)
I called & I was sent to patient's son's VM. It appears patient has moved to [facility] & I wanted to make sure she was still being seen at our clinic.

## 2020-02-14 DEATH — deceased
